# Patient Record
Sex: Female | Born: 1940
Health system: Southern US, Community
[De-identification: ages and names within clinical notes are randomized; demographics above are authoritative.]

## PROBLEM LIST (undated history)

## (undated) DIAGNOSIS — I714 Abdominal aortic aneurysm, without rupture: Secondary | ICD-10-CM

## (undated) DIAGNOSIS — I739 Peripheral vascular disease, unspecified: Secondary | ICD-10-CM

## (undated) DIAGNOSIS — F419 Anxiety disorder, unspecified: Secondary | ICD-10-CM

## (undated) DIAGNOSIS — R6889 Other general symptoms and signs: Secondary | ICD-10-CM

## (undated) DIAGNOSIS — I10 Essential (primary) hypertension: Secondary | ICD-10-CM

## (undated) DIAGNOSIS — E785 Hyperlipidemia, unspecified: Secondary | ICD-10-CM

## (undated) DIAGNOSIS — I2510 Atherosclerotic heart disease of native coronary artery without angina pectoris: Secondary | ICD-10-CM

## (undated) HISTORY — PX: CATARACT EXTRACTION W/ INTRAOCULAR LENS  IMPLANT, BILATERAL: SHX1307

## (undated) HISTORY — DX: Hyperlipidemia, unspecified: E78.5

## (undated) HISTORY — PX: CORONARY ANGIOPLASTY WITH STENT PLACEMENT: SHX49

## (undated) HISTORY — PX: SHOULDER OPEN ROTATOR CUFF REPAIR: SHX2407

## (undated) HISTORY — PX: FEMORAL ARTERY STENT: SHX1583

## (undated) HISTORY — PX: LUMBAR DISC SURGERY: SHX700

---

## 1990-02-12 HISTORY — PX: ABDOMINAL HYSTERECTOMY: SHX81

## 1999-05-08 ENCOUNTER — Encounter: Admission: RE | Admit: 1999-05-08 | Discharge: 1999-05-08 | Payer: Self-pay | Admitting: Obstetrics and Gynecology

## 1999-05-08 ENCOUNTER — Encounter: Payer: Self-pay | Admitting: Obstetrics and Gynecology

## 1999-08-09 ENCOUNTER — Encounter: Payer: Self-pay | Admitting: Obstetrics and Gynecology

## 1999-08-09 ENCOUNTER — Encounter: Admission: RE | Admit: 1999-08-09 | Discharge: 1999-08-09 | Payer: Self-pay | Admitting: Obstetrics and Gynecology

## 2000-05-23 ENCOUNTER — Encounter: Admission: RE | Admit: 2000-05-23 | Discharge: 2000-05-23 | Payer: Self-pay | Admitting: Obstetrics and Gynecology

## 2000-05-23 ENCOUNTER — Encounter: Payer: Self-pay | Admitting: Obstetrics and Gynecology

## 2001-08-29 ENCOUNTER — Encounter: Admission: RE | Admit: 2001-08-29 | Discharge: 2001-08-29 | Payer: Self-pay | Admitting: Obstetrics and Gynecology

## 2001-08-29 ENCOUNTER — Encounter: Payer: Self-pay | Admitting: Obstetrics and Gynecology

## 2002-03-24 ENCOUNTER — Encounter (INDEPENDENT_AMBULATORY_CARE_PROVIDER_SITE_OTHER): Payer: Self-pay | Admitting: Specialist

## 2002-03-24 ENCOUNTER — Ambulatory Visit (HOSPITAL_COMMUNITY): Admission: RE | Admit: 2002-03-24 | Discharge: 2002-03-24 | Payer: Self-pay | Admitting: Gastroenterology

## 2002-11-24 ENCOUNTER — Encounter: Admission: RE | Admit: 2002-11-24 | Discharge: 2002-11-24 | Payer: Self-pay | Admitting: Obstetrics and Gynecology

## 2002-11-24 ENCOUNTER — Encounter: Payer: Self-pay | Admitting: Obstetrics and Gynecology

## 2003-06-09 ENCOUNTER — Ambulatory Visit (HOSPITAL_BASED_OUTPATIENT_CLINIC_OR_DEPARTMENT_OTHER): Admission: RE | Admit: 2003-06-09 | Discharge: 2003-06-09 | Payer: Self-pay | Admitting: Orthopedic Surgery

## 2003-06-09 ENCOUNTER — Ambulatory Visit (HOSPITAL_COMMUNITY): Admission: RE | Admit: 2003-06-09 | Discharge: 2003-06-09 | Payer: Self-pay | Admitting: Orthopedic Surgery

## 2003-12-02 ENCOUNTER — Encounter: Admission: RE | Admit: 2003-12-02 | Discharge: 2003-12-02 | Payer: Self-pay | Admitting: Internal Medicine

## 2004-07-24 ENCOUNTER — Encounter: Admission: RE | Admit: 2004-07-24 | Discharge: 2004-07-24 | Payer: Self-pay | Admitting: Family Medicine

## 2004-08-31 ENCOUNTER — Ambulatory Visit (HOSPITAL_COMMUNITY): Admission: RE | Admit: 2004-08-31 | Discharge: 2004-09-01 | Payer: Self-pay | Admitting: Cardiovascular Disease

## 2004-09-03 ENCOUNTER — Inpatient Hospital Stay (HOSPITAL_COMMUNITY): Admission: EM | Admit: 2004-09-03 | Discharge: 2004-09-05 | Payer: Self-pay | Admitting: Emergency Medicine

## 2004-10-23 ENCOUNTER — Ambulatory Visit (HOSPITAL_COMMUNITY): Admission: RE | Admit: 2004-10-23 | Discharge: 2004-10-24 | Payer: Self-pay | Admitting: Cardiovascular Disease

## 2004-12-20 ENCOUNTER — Encounter: Admission: RE | Admit: 2004-12-20 | Discharge: 2004-12-20 | Payer: Self-pay | Admitting: Family Medicine

## 2005-12-31 ENCOUNTER — Encounter: Admission: RE | Admit: 2005-12-31 | Discharge: 2005-12-31 | Payer: Self-pay | Admitting: Family Medicine

## 2007-01-15 ENCOUNTER — Encounter: Admission: RE | Admit: 2007-01-15 | Discharge: 2007-01-15 | Payer: Self-pay | Admitting: Family Medicine

## 2007-11-27 ENCOUNTER — Encounter: Admission: RE | Admit: 2007-11-27 | Discharge: 2007-11-27 | Payer: Self-pay | Admitting: Obstetrics & Gynecology

## 2008-01-20 ENCOUNTER — Encounter: Admission: RE | Admit: 2008-01-20 | Discharge: 2008-01-20 | Payer: Self-pay | Admitting: Family Medicine

## 2009-01-21 ENCOUNTER — Encounter: Admission: RE | Admit: 2009-01-21 | Discharge: 2009-01-21 | Payer: Self-pay | Admitting: Family Medicine

## 2009-08-01 ENCOUNTER — Encounter: Admission: RE | Admit: 2009-08-01 | Discharge: 2009-08-01 | Payer: Self-pay | Admitting: Cardiovascular Disease

## 2009-08-05 ENCOUNTER — Inpatient Hospital Stay (HOSPITAL_COMMUNITY): Admission: RE | Admit: 2009-08-05 | Discharge: 2009-08-06 | Payer: Self-pay | Admitting: Cardiovascular Disease

## 2009-12-15 ENCOUNTER — Encounter (HOSPITAL_COMMUNITY)
Admission: RE | Admit: 2009-12-15 | Discharge: 2010-03-14 | Payer: Self-pay | Source: Home / Self Care | Attending: Cardiovascular Disease | Admitting: Cardiovascular Disease

## 2010-02-20 ENCOUNTER — Encounter
Admission: RE | Admit: 2010-02-20 | Discharge: 2010-02-20 | Payer: Self-pay | Source: Home / Self Care | Attending: Family Medicine | Admitting: Family Medicine

## 2010-03-05 ENCOUNTER — Encounter: Payer: Self-pay | Admitting: Family Medicine

## 2010-03-15 ENCOUNTER — Encounter (HOSPITAL_COMMUNITY): Payer: Self-pay

## 2010-03-17 ENCOUNTER — Encounter (HOSPITAL_COMMUNITY): Payer: Self-pay

## 2010-03-20 ENCOUNTER — Encounter (HOSPITAL_COMMUNITY): Payer: Self-pay

## 2010-03-22 ENCOUNTER — Encounter (HOSPITAL_COMMUNITY): Payer: Self-pay

## 2010-03-24 ENCOUNTER — Encounter (HOSPITAL_COMMUNITY): Payer: Self-pay

## 2010-03-27 ENCOUNTER — Encounter (HOSPITAL_COMMUNITY): Payer: Self-pay

## 2010-03-29 ENCOUNTER — Encounter (HOSPITAL_COMMUNITY): Payer: Self-pay

## 2010-03-31 ENCOUNTER — Encounter (HOSPITAL_COMMUNITY): Payer: Self-pay

## 2010-04-03 ENCOUNTER — Encounter (HOSPITAL_COMMUNITY): Payer: Self-pay

## 2010-04-05 ENCOUNTER — Encounter (HOSPITAL_COMMUNITY): Payer: Self-pay

## 2010-04-07 ENCOUNTER — Encounter (HOSPITAL_COMMUNITY): Payer: Self-pay

## 2010-04-10 ENCOUNTER — Encounter (HOSPITAL_COMMUNITY): Payer: Self-pay

## 2010-04-12 ENCOUNTER — Encounter (HOSPITAL_COMMUNITY): Payer: Self-pay

## 2010-04-14 ENCOUNTER — Encounter (HOSPITAL_COMMUNITY): Payer: Self-pay

## 2010-04-17 ENCOUNTER — Encounter (HOSPITAL_COMMUNITY): Payer: Self-pay

## 2010-04-19 ENCOUNTER — Encounter (HOSPITAL_COMMUNITY): Payer: Self-pay

## 2010-04-21 ENCOUNTER — Encounter (HOSPITAL_COMMUNITY): Payer: Self-pay

## 2010-04-24 ENCOUNTER — Encounter (HOSPITAL_COMMUNITY): Payer: Self-pay

## 2010-04-26 ENCOUNTER — Encounter (HOSPITAL_COMMUNITY): Payer: Self-pay

## 2010-04-28 ENCOUNTER — Encounter (HOSPITAL_COMMUNITY): Payer: Self-pay

## 2010-04-30 LAB — BASIC METABOLIC PANEL
CO2: 26 mEq/L (ref 19–32)
Calcium: 8.7 mg/dL (ref 8.4–10.5)
Chloride: 102 mEq/L (ref 96–112)
GFR calc Af Amer: >60 mL/min (ref >60)
Glucose, Bld: 102 mg/dL — ABNORMAL HIGH (ref 70–99)
Potassium: 3.7 mEq/L (ref 3.5–5.1)
Sodium: 137 mEq/L (ref 135–145)

## 2010-04-30 LAB — CBC
HCT: 39 % (ref 36.0–46.0)
Hemoglobin: 13.3 g/dL (ref 12.0–15.0)
MCH: 35.2 pg — ABNORMAL HIGH (ref 26.0–34.0)
MCHC: 34.1 g/dL (ref 30.0–36.0)
RBC: 3.78 MIL/uL — ABNORMAL LOW (ref 3.87–5.11)

## 2010-05-01 ENCOUNTER — Encounter (HOSPITAL_COMMUNITY): Payer: Self-pay

## 2010-05-03 ENCOUNTER — Encounter (HOSPITAL_COMMUNITY): Payer: Self-pay

## 2010-05-05 ENCOUNTER — Encounter (HOSPITAL_COMMUNITY): Payer: Self-pay

## 2010-05-08 ENCOUNTER — Encounter (HOSPITAL_COMMUNITY): Payer: Self-pay

## 2010-05-10 ENCOUNTER — Encounter (HOSPITAL_COMMUNITY): Payer: Self-pay

## 2010-05-12 ENCOUNTER — Encounter (HOSPITAL_COMMUNITY): Payer: Self-pay

## 2010-05-15 ENCOUNTER — Encounter (HOSPITAL_COMMUNITY): Payer: Self-pay

## 2010-05-17 ENCOUNTER — Encounter (HOSPITAL_COMMUNITY): Payer: Self-pay

## 2010-05-19 ENCOUNTER — Encounter (HOSPITAL_COMMUNITY): Payer: Self-pay

## 2010-05-22 ENCOUNTER — Encounter (HOSPITAL_COMMUNITY): Payer: Self-pay

## 2010-05-24 ENCOUNTER — Encounter (HOSPITAL_COMMUNITY): Payer: Self-pay

## 2010-05-26 ENCOUNTER — Encounter (HOSPITAL_COMMUNITY): Payer: Self-pay

## 2010-05-29 ENCOUNTER — Encounter (HOSPITAL_COMMUNITY): Payer: Self-pay

## 2010-05-31 ENCOUNTER — Encounter (HOSPITAL_COMMUNITY): Payer: Self-pay

## 2010-06-02 ENCOUNTER — Encounter (HOSPITAL_COMMUNITY): Payer: Self-pay

## 2010-06-05 ENCOUNTER — Encounter (HOSPITAL_COMMUNITY): Payer: Self-pay

## 2010-06-30 NOTE — Cardiovascular Report (Signed)
NAME:  Sara Dominguez, Sara Dominguez            ACCOUNT NO.:  [id number]   MEDICAL RECORD NO.:  [id number]          PATIENT TYPE:  OIB   LOCATION:  2873                         FACILITY:  [facility]   PHYSICIAN:  Nanetta Batty, M.D.   DATE OF BIRTH:  [birth year]   DATE OF PROCEDURE:  08/31/2004  DATE OF DISCHARGE:                              CARDIAC CATHETERIZATION   [name] is a 70 year old married white female patient of Dr. [name] who was referred for claudication.  A screening Cardiolite suggested  anterior ischemia.  The patient did complain of symptoms of chest pain.  Diagnostic cath at [facility] three days ago revealed high  grade proximal LAD disease with moderate circumflex and RCA disease and  normal LV function.  She was treated with aspirin and Plavix and presents  now for elective PCI and stenting.   DESCRIPTION OF PROCEDURE:  The patient was brought to the second floor [facility] in the postabsorptive state.  She was  premedicated with p.o. Valium.  Her right groin was prepped and shaved in  the usual sterile fashion.  1% Xylocaine was used for local anesthesia.  A 7  French sheath was inserted into the right femoral artery using Seldinger  technique.  A 6 French sheath was inserted into the right femoral vein.  A 7  [place] XBLD guide catheter, an [id number] Asahi soft guide-wire, and a 25  Quantum Maverick were used for initial PCI.  The patient was on aspirin and  Plavix and received an additional 150 mg p.o. Plavix and Angiomax bolus with  AST of greater than 300.  Visipaque dye was used for the entirety of the  case.  Retrograde aortic pressures were monitored the rest of the case.  The  patient did become vagal with hypotension and bradycardia requiring 0.5 mg  Atropine and a liter of half normal saline resuscitation which ultimately  brought her blood pressure up above 100.  The guide-wire was placed in the  distal LAD quite  easily.  PCI was performed with the Quantum Maverick at  nominal pressures and stenting with a 2.5/16 Taxus at 15 atmospheres (2.8  mm) for 30 seconds.  The patient did have some PVCs and mild ST segment  elevation which resolved with balloon deflation.  Final angiographic result  was reduction of the 90% proximal LAD lesion to 0% residual with excellent  flow.  The patient tolerated the procedure well.  The guide-wire and  catheter were removed.  The sheath was sewn securely in place.  The patient  left the lab in stable condition.  The sheaths will be removed in two hours.  The patient will be discharged home in the morning on aspirin and Plavix and  we will see him back in the office in 1-2 weeks for follow up.  We will  continue to pursue her peripheral vascular evaluation in several weeks.       JB/MEDQ  D:  08/31/2004  T:  08/31/2004  Job:  [id number]   cc:   [name], M.D.  [address]  [address]  [place], [place] [address]  Fax: [phone]

## 2010-06-30 NOTE — Op Note (Signed)
   NAME:  Criscuolo, MALONE ADMIRE                      ACCOUNT NO.:  [id number]   MEDICAL RECORD NO.:  [id number]                   PATIENT TYPE:  AMB   LOCATION:  ENDO                                 FACILITY:  [facility]   PHYSICIAN:  John C. Madilyn Fireman, M.D.                 DATE OF BIRTH:  Sep 29, 1940   DATE OF PROCEDURE:  03/24/2002  DATE OF DISCHARGE:                                 OPERATIVE REPORT   PROCEDURE:  Colonoscopy.   INDICATIONS FOR PROCEDURE:  Family history of colon cancer in a first degree  relative.   DESCRIPTION OF PROCEDURE:  The patient was placed in the left lateral  decubitus position then placed on the pulse monitor with continuous low flow  oxygen delivered by nasal cannula. She was sedated with 100 mcg IV fentanyl  and 11 mg IV Versed. The Olympus video colonoscope was inserted into the  rectum and advanced to the cecum, confirmed by transillumination at  McBurney's point and visualization of the ileocecal valve and appendiceal  orifice. The prep was excellent. The cecum and ascending colon appeared  normal. Within the transverse colon, there was an 8 mm sessile polyp which  was fulgurated by hot biopsy. The remainder of the transverse, descending  and sigmoid appeared normal with no further masses, polyps, diverticula or  other mucosal abnormalities. Within the rectum, there was a 1 cm sessile  polyp which was fulgurated by snare. The scope was then withdrawn and the  patient returned to the recovery room in stable condition. The patient  tolerated the procedure well and there were no immediate complications.   IMPRESSION:  Rectal and transverse colon polyps.   PLAN:  Await histology to determine interval for next colonoscopy.                                               John C. Madilyn Fireman, M.D.    JCH/MEDQ  D:  03/24/2002  T:  03/24/2002  Job:  [id number]   cc:   Aram Beecham P. Romine, M.D.  [address]  [place]  [place] [address]  Fax: [phone]

## 2010-06-30 NOTE — Discharge Summary (Signed)
NAME:  Sara Dominguez, Sara Dominguez            ACCOUNT NO.:  [id number]   MEDICAL RECORD NO.:  [id number]          PATIENT TYPE:  OIB   LOCATION:  6532                         FACILITY:  [facility]   PHYSICIAN:  Nanetta Batty, M.D.   DATE OF BIRTH:  August 09, 1940   DATE OF ADMISSION:  10/23/2004  DATE OF DISCHARGE:  10/24/2004                                 DISCHARGE SUMMARY   DISCHARGE DIAGNOSES:  1.  Peripheral vascular disease with life/activity-limiting claudication and      status post intervention during this admission to the right SFA.  2.  Known coronary artery disease with prior intervention.  3.  Hypertension.  4.  Hyperlipidemia.  5.  History of long tobacco abuse.  Quit two months ago.   HISTORY OF PRESENT ILLNESS/HOSPITAL COURSE:  This is a [age] year old Caucasian  Dominguez who was seen by Dr. August Saucer in our office for claudication of lower  extremities, right greater than left.  She ultrasound Dopplers and  ultrasound which revealed abnormal ABIs and patient was scheduled for  peripheral vascular angiography to delineate anatomy and possible  intervention.  For that she was brought to the hospital to the short-stay  unit on October 23, 2004.  She was taken to the peripheral vascular  laboratory.  After pre medication underwent peripheral vascular angiography  40% long segmental mid SFA lesion with three vessel runoff of the left lower  extremity and right lower extremity revealed 40% segmental right external  iliac artery lesion, 80% focal right SFA lesion with three vessel runoff.  Abdominal aorta revealed normal renal arteries and infrarenal aorta.  Had a  moderate ______sclerosis.  Patient underwent angioplasty and stenting with  Smart stent of right SFA artery with reduction of the lesion from 80 to 0%.  Procedure was performed by Dr. [name].  Patient was given aspirin and Plavix.  Tolerated procedure well.  Transferred to the telemetry unit in stable  condition.   LABORATORIES:   Hemoglobin was 11.1 and hematocrit 31.9, white blood cell  count 8.2, platelets 254.  Sodium 139, potassium 4, chloride 105, CO2 28,  BUN 8, creatinine 0.8, glucose 95.  She was evaluated by Dr. [name] on the  day of discharge and her groin did not have any signs of hematoma.  Pedal  pulses were intact and patient was considered to be stable for discharge  home.   DISCHARGE MEDICATIONS:  1.  Benicar 40/12.5 mg daily.  2.  Estrogen patch weekly, change on Monday.  3.  Multivitamins daily.  4.  Calcium 300 mg with vitamin D daily.  5.  Fish oil 1000 mg daily.  6.  Aspirin 81 mg daily.  7.  Prilosec over-the-counter 20 mg daily.  8.  Plavix 75 mg daily.   DISCHARGE DIET:  Low salt, low fat, low cholesterol diet.   DISCHARGE ACTIVITIES:  No driving.  No lifting greater than 5 pounds.  No  strenuous activities for three days post catheterization.  Patient was  instructed to increase activities slowly.  She was allowed to shower but no  bath was allowed.   DISCHARGE FOLLOW-UP:  Lower extremity ultrasound  scheduled on October 5 at  4:30 p.m. and appointment with Dr. [name] scheduled on October 10 at 10:30  a.m.      Raymon Mutton, P.A.      Nanetta Batty, M.D.  Electronically Signed    MK/MEDQ  D:  10/24/2004  T:  10/24/2004  Job:  [id number]

## 2010-06-30 NOTE — Cardiovascular Report (Signed)
NAME:  Sara Dominguez, Sara Dominguez            ACCOUNT NO.:  [id number]   MEDICAL RECORD NO.:  [id number]          PATIENT TYPE:  INP   LOCATION:  3733                         FACILITY:  [facility]   PHYSICIAN:  Madaline Savage, M.D.DATE OF BIRTH:  20-Apr-1940   DATE OF PROCEDURE:  09/04/2004  DATE OF DISCHARGE:                              CARDIAC CATHETERIZATION   PROCEDURES PERFORMED:  1.  Retrograde left heart catheterization without left ventricular      angiography.  2.  Selective coronary angiography by Judkins technique.   INDICATIONS FOR PROCEDURE:  The patient is 70 years old. She had a drug-  eluting stent placed in her LAD on August 31, 2004 by Dr. [name]. She had chest  pain within the last two days and represents at this time with elevated  troponins that in retrospect appear to be a holdover from her elevated  troponins done prior to original stenting. This catheterization is done to  therefore clarify the issues leading to chest pain and elevated troponins  now four days after LAD stenting.   RESULTS:  The left ventricular pressure was 90/9, end-diastolic pressure 10.  Central aortic pressure 90/52, mean of 70. No aortic valve gradient by  pullback technique.   ANGIOGRAPHIC RESULTS:  The patient's left main coronary artery is normal.  The proximal LAD is fairly small. It is mildly calcified and in a long  segmental nature. There is a radio-opaque stent which is the largest portion  of the proximal and mid LAD. The stent overlaps the first septal perforator  branch. There is a step-up and step-down into and from the stent. The vessel  distal to the stented LAD is somewhat tortuous. There is a diagonal that  comes off at an acute angle but basically there is luminal irregularity only  no high-grade stenosis in the entire LAD status post LAD stenting.   Left circumflex coronary artery is a nondominant vessel giving rise to an  obtuse marginal branch which is as big as a circumflex  itself. Although  there are luminal irregularities, there are no significant high-grade  lesions in that circumflex.   The right coronary artery is a very bizarre-looking vessel that is dominant  in that it gives rise to posterior descending branch and a small  posterolateral branch; however, the vessel is smallest in its proximal  portion just before an acute marginal branch. It is perhaps only 1.5 to 2 mm  in diameter there. Then the vessel becomes ectatic. Then it becomes  aneurysmal midway down and then the largest portion of the vessel is the  distal vessel. The aneurysmal portion of the vessel contains a hypodense  area that is not felt to be stenotic. After intravenous nitroglycerin to 200  milligrams was given, the vessel plumps up and shows that there is no  significant lesions throughout the entirety of the right coronary artery  which remains bizarre-looking even after nitroglycerin administration.   As previously stated, no left ventriculogram was performed since it was just  done four days ago and there has been no interval change that would warrant  another need for LV  angiography, but pressures were done which showed no  evidence of a gradient.   FINAL DIAGNOSES:  1.  Status post left anterior descending artery stenting four days ago with      patency of left anterior descending artery stent site. No new lesions in      any coronary artery territory identified over the interim from today and      four days ago.  2.  The plan will be reassurance to the patient and I suspect that Plavix      may have some relation to the patient's chest pain so Pepcid was given      in the cath lab 20 milligrams IV. We will give her oral Protonix twice      daily 40 milligrams for the next week and thereafter Protonix 40      milligrams a day for at least a month.       WHG/MEDQ  D:  09/04/2004  T:  09/04/2004  Job:  [id number]   cc:   Nanetta Batty, M.D.  Fax: [phone]   Cardiac  catheter lab   [facility] Records

## 2010-06-30 NOTE — Discharge Summary (Signed)
NAME:  Sara Dominguez, Sara Dominguez            ACCOUNT NO.:  [id number]   MEDICAL RECORD NO.:  [id number]          PATIENT TYPE:  INP   LOCATION:  3733                         FACILITY:  [facility]   PHYSICIAN:  Nanetta Batty, M.D.   DATE OF BIRTH:  20-Jun-1940   DATE OF ADMISSION:  09/03/2004  DATE OF DISCHARGE:  09/05/2004                                 DISCHARGE SUMMARY   ADMISSION DIAGNOSES:  1.  Recurrent chest pain, status post percutaneous coronary intervention and      stenting of the left anterior descending artery on August 31, 2004, using      a Taxus stent to the left anterior descending artery proximal lesion,      90% to 0.  2.  Hypertension.  3.  Dyslipidemia.  4.  Tobacco abuse.   DISCHARGE DIAGNOSES:  1.  Recurrent chest pain, status post percutaneous coronary intervention and      stenting of the left anterior descending artery on August 31, 2004, using      a Taxus stent to the left anterior descending artery proximal lesion,      90% to 0.  2.  Hypertension.  3.  Dyslipidemia.  4.  Tobacco abuse.   PROCEDURES:  Cardiac catheterization September 04, 2004, Madaline Savage, M.D.   BRIEF HISTORY:  The patient is a 70 year old white female who presented to  the ER in the a.m. of September 04, 2004, complaining of chest pain.  She had  recently undergone PCI and stenting of her LAD after having a positive  Cardiolite.  She was admitted, placed on IV Integrilin and nitroglycerin.  The nitroglycerin had to be discontinued because of hypertension, so she was  maintained on heparin IV and Integrilin.  Her enzymes were negative.  She  was taken to the catheterization lab on September 04, 2004, at which time Dr.  Chanda Busing performed the study.  Findings showed no new lesions since  four days prior.  The LAD stent was patent, and it was his opinion that her  chest discomfort was GI in origin.  She remained stable overnight.  The  following day her hemoglobin was 13.1, hematocrit was 38.6, white  count was  8.1, platelets were 227,000.  Electrolytes were normal, BUN was 14,  creatinine of 0.8, glucose was 96.  Troponins were 0.79 and 0.97, CKs and CK-  MBs were negative.  At this point it was Dr. Truett Perna opinion that the  patient could be discharged to home.  She was having no chest pain.   She will be placed on:  1.  Zocor 40 mg daily.  2.  Plavix 75 mg daily.  3.  Coated aspirin 325 mg daily.  4.  Benicar/HCTZ 40/12.5 mg one daily.  5.  Nitroglycerin one every five minutes p.r.n. as needed sublingual.  6.  Protonix 40 mg daily.   She was instructed to keep her August 1 appointment with Dr. [name].  She was  also re-instructed to discontinue smoking.      Eber Hong, P.A.      Nanetta Batty, M.D.  Electronically Signed  WDJ/MEDQ  D:  11/07/2004  T:  11/08/2004  Job:  [id number]   cc:   [name], M.D.  Fax: [phone]

## 2010-06-30 NOTE — Cardiovascular Report (Signed)
NAME:  Sara Dominguez, Sara Dominguez            ACCOUNT NO.:  [id number]   MEDICAL RECORD NO.:  [id number]          PATIENT TYPE:  OIB   LOCATION:  6532                         FACILITY:  [facility]   PHYSICIAN:  Nanetta Batty, M.D.   DATE OF BIRTH:  02-29-1940   DATE OF PROCEDURE:  10/23/2004  DATE OF DISCHARGE:                              CARDIAC CATHETERIZATION   [name] is a 70 year old female with a history of CAD and PVOD.  She  was originally referred for claudication.  She has known CAD status post PCI  and stenting of her LAD with moderate circumflex and RCA disease.  Dopplers  revealed high grade right SFA stenosis.  She presents now for angiography  and potential intervention.   DESCRIPTION OF PROCEDURE:  The patient was brought to the sixth floor [facility] in the postabsorptive state.  She was premedicated with p.o. Valium.  Her left groin was prepped and  shaved in the usual sterile fashion.  1% Xylocaine was used for local  anesthesia.  A 5 French sheath was inserted into the left femoral artery  using standard Seldinger technique.  A 5 [place] tennis racquet catheter and  a cross over catheter were used for distal abdominal aortography with  bilateral femoral run off.  Visipaque dye was used for the entirety of the  case.  Retrograde aortic pressures were monitored during the case.   ANGIOGRAPHIC RESULTS:  1.  Abdominal aorta:      1.  Renal arteries - normal.      2.  Infrarenal abdominal aorta - moderate atherosclerotic changes.  2.  Left lower extremity:      1.  40% long segmental mid left SFA.  3.  Right lower extremity:      1.  40% segmental right external iliac artery.      2.  40% segmental mid right SFA.      3.  80% fairly focal SFA in [place] with three vessel run off.   PROCEDURE DESCRIPTION:  Contralateral access was obtained with a cross over  catheter, Wholey wire, and 7 French Terumo sheath.  The patient  received  2500 units of heparin intravenously.  Using 035 Wholey wire and a 4/2  Powerflex, PTCA was performed.  Stenting was performed with a 6/2 Smart and  post dilatation with a 6/2 Powerflex, resulting in reduction of 80% fairly  focal distal right SFA stenosis in [place] to 0% residual with  excellent flow.  The patient tolerated the procedure well.  The wire was  removed.  The long sheath was exchanged for a short 7 sheath.  The ACT was  measured.  The sheath was removed and pressure was held on the groin to  achieve hemostasis.  The patient left the lab in stable condition.  She will  be treated with aspirin and Plavix, hydrated, and discharged home in the  morning.  She will have follow up lower extremity Dopplers and she will be  seen back in the office in 1-2 weeks for follow up.      Nanetta Batty, M.D.  Electronically Signed     JB/MEDQ  D:  10/23/2004  T:  10/23/2004  Job:  [id number]   cc:   [name], M.D.  Fax: [phone]

## 2010-06-30 NOTE — H&P (Signed)
NAME:  Dominguez, Sara            ACCOUNT NO.:  [id number]   MEDICAL RECORD NO.:  [id number]          PATIENT TYPE:  INP   LOCATION:  1823                         FACILITY:  [facility]   PHYSICIAN:  Ulyses Amor, MD DATE OF BIRTH:  01/14/1941   DATE OF ADMISSION:  09/03/2004  DATE OF DISCHARGE:                                HISTORY & PHYSICAL   Sara Dominguez is a 70 year old white woman who was admitted to [facility] for further evaluation of chest pain.   This patient has a history of coronary artery disease which dates back  several weeks. A screening Cardiolite demonstrated evidence of anterior  ischemia. She then underwent cardiac catheterization which demonstrated a  high grade proximal LAD lesion as well as moderate disease in the circumflex  and right coronary artery. Three days ago, the patient underwent  uncomplicated stent deployment in the proximal LAD. She was discharged and  experienced no symptoms until this morning. She awoke with a low grade  discomfort in her right anterior chest. This was described as a pressure. It  continued throughout the course of the day. This afternoon, she took three  nitroglycerin which seemed to help the discomfort somewhat though it has  continued. She believes it is similar in quality to the chest discomfort  that she experienced prior to her stent placement. It appears to be  unrelated to position, activity, meals, or respirations. There were no  exacerbating or ameliorating factors other than as described above. There is  no associated dyspnea, diaphoresis, or nausea. Though the chest discomfort  is largely resolved at this time, she still feels it present to some minor  extent.   The patient has no history of myocardial infarction, congestive heart  failure, or arrhythmia. She has a number of risk factors of coronary artery  disease including hypertension, dyslipidemia, smoking (discontinued at the  time of  her cardiac catheterization), and a family history of coronary  artery disease. There is no history of diabetes mellitus.   The patient's past medical history is otherwise unremarkable.   MEDICATIONS:  Plavix, Benicar, Zocor, and aspirin.   ALLERGIES:  None.   SIGNIFICANT INJURIES:  None.   OPERATIONS:  1.  Back surgery.  2.  Shoulder surgery.  3.  Hysterectomy.   SOCIAL HISTORY:  The patient is a retired Engineer, site. She lives with her  husband. She drinks occasional wine. She no longer smokes cigarettes as  described above.   FAMILY HISTORY:  A father died of a myocardial infarction. Her mother died  of colon cancer.   REVIEW OF SYSTEMS:  Reveals no new problems related to her head, eyes, ears,  nose, mouth, throat, lungs, gastrointestinal system, genitourinary system,  or extremities. There is no history of neurologic or psychiatric disorder.  There is no history of fever, chills, or weight loss.   PHYSICAL EXAMINATION:  VITAL SIGNS:  Blood pressure 123/70, pulse 63 and  regular, respirations 22. Temperature 97.9.  GENERAL:  The patient was a middle-aged white woman in no discomfort. She  was alert, oriented, appropriate, and responsive.  HEENT:  Normal.  NECK:  Without thyromegaly or adenopathy. Carotid pulses were palpable  bilaterally and without bruits.  CARDIOVASCULAR:  Normal S1 and S2. There was no S3, S4, murmur, rub, or  click. Cardiac rhythm was regular. No chest wall tenderness was noted.  LUNGS:  Clear.  ABDOMEN:  Soft and nontender. There was no mass, hepatosplenomegaly, bruit,  distension, rebound, guarding, or rigidity. Bowel sounds were normal.  BREASTS:  Not performed as they were not pertinent to the reason for acute  care hospitalization.  PELVIC:  Not performed as they were not pertinent to the reason for acute  care hospitalization.  RECTAL:  Not performed as they were not pertinent to the reason for acute  care hospitalization.  EXTREMITIES:   Without edema, deviation, or deformity. Radial and dorsalis  pedis pulses were palpable bilaterally.  NEUROLOGICAL:  Unremarkable.   The electrocardiogram was normal. The chest radiography had not yet been  performed at the time of this dictation. The initial set of cardiac markers  revealed a myoglobin of 45.4. CK-MB of 1.2, and troponin 0.30. The second  set of cardiac markers revealed a myoglobin of 46.3, CK-MB 1.1, and troponin  0.30. Potassium was 3.9, BUN 10, and creatinine 0.8. The remaining studies  were pending at the time of this dictation.   IMPRESSION:  1.  Chest pain:  Rule out cardiac ischemia.  2.  Coronary artery disease:  Three days status post proximal left anterior      descending artery stent deployment with residual moderate disease of the      circumflex and right coronary artery.  3.  Dyslipidemia.  4.  Hypertension.   PLAN:  1.  Telemetry.  2.  Serial cardiac enzymes.  3.  Aspirin.  4.  Continued Plavix.  5.  Intravenous nitroglycerin.  6.  Intravenous heparin.  7.  Further measures per Dr. Nanetta Batty.       MSC/MEDQ  D:  09/03/2004  T:  09/03/2004  Job:  [id number]   cc:   Nanetta Batty, M.D.  Fax: [phone]

## 2010-06-30 NOTE — Op Note (Signed)
NAME:  Griffin, DOT SPLINTER                      ACCOUNT NO.:  [id number]   MEDICAL RECORD NO.:  [id number]                   PATIENT TYPE:  AMB   LOCATION:  DSC                                  FACILITY:  [facility]   PHYSICIAN:  Harvie Junior, M.D.                DATE OF BIRTH:  1940/09/28   DATE OF PROCEDURE:  06/09/2003  DATE OF DISCHARGE:                                 OPERATIVE REPORT   PREOPERATIVE DIAGNOSIS:  Impingement with suspected rotator cuff tear.   POSTOPERATIVE DIAGNOSIS:  Impingement with rotator cuff tear.   OPERATION PERFORMED:  1. Mini open rotator cuff repair.  2. Arthroscopic acromioplasty.  3. Debridement of the rotator cuff from within the glenohumeral joint.   SURGEON:  Harvie Junior, M.D.   ASSISTANT:  Marshia Ly, P.A.   ANESTHESIA:  General.   INDICATIONS FOR PROCEDURE:  The patient is a 70 year old female with a long  history of having left shoulder pain.  She was evaluated in the office where  injection therapy worked wonderfully to control her pain.  Subsequent  injections worked less well.  She had some neck issues which had been  evaluated, mostly were right-sided neck issues and because of continued  complaints of left shoulder pain, failure of conservative care and  improvement with injection, she was taken to the operating room for  arthroscopic evaluation and repair as needed.   DESCRIPTION OF PROCEDURE:  The patient was taken to the operating room and  after adequate anesthesia had been obtained with general anesthetic, patient  placed supine on the operating table where the left shoulder was prepped and  draped in the usual sterile fashion. Following this, routine arthroscopic  examination of the shoulder revealed that the biceps tendon was within  normal limits.  Glenohumeral joint within normal limits.  There was a small  superior posterior labral tear which was debrided.  Attention was turned to  the rotator cuff undersurface.  There  was a thinned area at the anterior  leading edge of the supraspinatus which was evaluated and debrided.  This  was a partial thickness tear and did not go full thickness through the  rotator cuff.  Following this, attention was turned now to the glenohumeral  joint and to the subacromial space.  The subacromial space was identified  and noted to have a large anterolateral spur.  This was debrided with a  motorized bur back to a smooth undersurface.  The acromioclavicular joint  was identified and noted to have no significant pathology.  Attention was  turned to the rotator cuff.  There was a large thickened area of the rotator  cuff which was debrided from the superior surface and probed.  The probe  ultimately did go through the cuff in this area and there was concern given  the nature of the appearance of the undersurface and the appearance of the  bursal side this really was  an impending rotator cuff tear.  At this point  the camera was removed from the shoulder.  A small incision was made over  the lateral side.  The subcutaneous tissue dissected down through the area  of the deltoid muscle.  The bursa was resected.  The rotator cuff was  identified and the rotator cuff was repaired with a single 5-0 anchor with  two #2 FiberWire sutures.  #2 FiberWire closing suture was then used on the  side-to-side component of this and following this, the shoulder was  copiously irrigated and suctioned dry.  The shoulder was put through range  of motion.  Easy full range of motion was achieved at this point.  The  deltoid was then closed with 1 Vicryl running suture.  The skin with a  combination  of 2-0 and 3-0 Maxon pullout suture.  Benzoin and Steri-Strips were applied.  Sterile compressive dressing was applied.  The patient was then transferred  to the recovery room where she was noted to be in satisfactory condition in  a shoulder sling.                                               Harvie Junior, M.D.    Ranae Plumber  D:  06/09/2003  T:  06/09/2003  Job:  [id number]

## 2011-01-26 ENCOUNTER — Other Ambulatory Visit: Payer: Self-pay | Admitting: Family Medicine

## 2011-01-26 DIAGNOSIS — Z1231 Encounter for screening mammogram for malignant neoplasm of breast: Secondary | ICD-10-CM

## 2011-02-22 ENCOUNTER — Ambulatory Visit
Admission: RE | Admit: 2011-02-22 | Discharge: 2011-02-22 | Disposition: A | Discharge status: Home / Self Care | Payer: Medicare Other | Source: Ambulatory Visit | Attending: Family Medicine | Admitting: Family Medicine

## 2011-02-22 DIAGNOSIS — Z1231 Encounter for screening mammogram for malignant neoplasm of breast: Secondary | ICD-10-CM

## 2011-02-27 ENCOUNTER — Other Ambulatory Visit: Payer: Self-pay | Admitting: Gastroenterology

## 2011-06-26 ENCOUNTER — Encounter (HOSPITAL_COMMUNITY): Payer: Self-pay | Admitting: Respiratory Therapy

## 2011-07-03 ENCOUNTER — Other Ambulatory Visit: Payer: Self-pay | Admitting: Cardiovascular Disease

## 2011-07-05 ENCOUNTER — Encounter (HOSPITAL_COMMUNITY): Payer: Self-pay | Admitting: General Practice

## 2011-07-05 ENCOUNTER — Encounter (HOSPITAL_COMMUNITY)
Admission: RE | Disposition: A | Discharge status: Home / Self Care | Payer: Self-pay | Source: Ambulatory Visit | Attending: Cardiovascular Disease

## 2011-07-05 ENCOUNTER — Ambulatory Visit (HOSPITAL_COMMUNITY)
Admission: RE | Admit: 2011-07-05 | Discharge: 2011-07-06 | Disposition: A | Discharge status: Home / Self Care | Payer: Medicare Other | Source: Ambulatory Visit | Attending: Cardiovascular Disease | Admitting: Cardiovascular Disease

## 2011-07-05 DIAGNOSIS — I739 Peripheral vascular disease, unspecified: Secondary | ICD-10-CM | POA: Diagnosis present

## 2011-07-05 DIAGNOSIS — Z9861 Coronary angioplasty status: Secondary | ICD-10-CM | POA: Insufficient documentation

## 2011-07-05 DIAGNOSIS — I2510 Atherosclerotic heart disease of native coronary artery without angina pectoris: Secondary | ICD-10-CM | POA: Insufficient documentation

## 2011-07-05 DIAGNOSIS — I714 Abdominal aortic aneurysm, without rupture: Secondary | ICD-10-CM | POA: Diagnosis present

## 2011-07-05 DIAGNOSIS — R6889 Other general symptoms and signs: Secondary | ICD-10-CM | POA: Diagnosis present

## 2011-07-05 DIAGNOSIS — I70219 Atherosclerosis of native arteries of extremities with intermittent claudication, unspecified extremity: Secondary | ICD-10-CM | POA: Insufficient documentation

## 2011-07-05 HISTORY — PX: ABDOMINAL AORTAGRAM: SHX5454

## 2011-07-05 HISTORY — DX: Essential (primary) hypertension: I10

## 2011-07-05 HISTORY — DX: Other general symptoms and signs: R68.89

## 2011-07-05 HISTORY — DX: Anxiety disorder, unspecified: F41.9

## 2011-07-05 HISTORY — DX: Peripheral vascular disease, unspecified: I73.9

## 2011-07-05 HISTORY — DX: Abdominal aortic aneurysm, without rupture: I71.4

## 2011-07-05 HISTORY — PX: LOWER EXTREMITY ANGIOGRAM: SHX5508

## 2011-07-05 HISTORY — PX: PERCUTANEOUS STENT INTERVENTION: SHX5500

## 2011-07-05 HISTORY — DX: Atherosclerotic heart disease of native coronary artery without angina pectoris: I25.10

## 2011-07-05 LAB — POCT ACTIVATED CLOTTING TIME
Activated Clotting Time: 314 seconds
Activated Clotting Time: 226 seconds
Activated Clotting Time: 188 seconds

## 2011-07-05 SURGERY — ANGIOGRAM, LOWER EXTREMITY
Anesthesia: LOCAL | Laterality: Right

## 2011-07-05 MED ORDER — ASPIRIN 325 MG PO TABS: 325.0000 mg | ORAL_TABLET | Freq: Every day | ORAL | Status: DC

## 2011-07-05 MED ORDER — HEPARIN (PORCINE) IN NACL 2-0.9 UNIT/ML-% IJ SOLN
INTRAMUSCULAR | Status: AC
  Filled 2011-07-05: qty 1000

## 2011-07-05 MED ORDER — SODIUM CHLORIDE 0.9 % IV SOLN
INTRAVENOUS | Status: DC
  Administered 2011-07-05: 1000 mL via INTRAVENOUS

## 2011-07-05 MED ORDER — LIDOCAINE HCL (PF) 1 % IJ SOLN
INTRAMUSCULAR | Status: AC
  Filled 2011-07-05: qty 30

## 2011-07-05 MED ORDER — SODIUM CHLORIDE 0.9 % IV SOLN: INTRAVENOUS | Status: AC

## 2011-07-05 MED ORDER — ATORVASTATIN CALCIUM 40 MG PO TABS
40.0000 mg | ORAL_TABLET | Freq: Every day | ORAL | Status: DC
Start: 2011-07-05 — End: 2011-07-06
  Administered 2011-07-05: 40 mg via ORAL
  Filled 2011-07-05 (×2): qty 1

## 2011-07-05 MED ORDER — DIAZEPAM 5 MG PO TABS
5.0000 mg | ORAL_TABLET | ORAL | Status: AC
  Administered 2011-07-05: 5 mg via ORAL
  Filled 2011-07-05: qty 1

## 2011-07-05 MED ORDER — PRASUGREL HCL 10 MG PO TABS
10.0000 mg | ORAL_TABLET | Freq: Every day | ORAL | Status: DC
  Filled 2011-07-05: qty 1

## 2011-07-05 MED ORDER — MIDAZOLAM HCL 2 MG/2ML IJ SOLN
INTRAMUSCULAR | Status: AC
  Filled 2011-07-05: qty 2

## 2011-07-05 MED ORDER — ACETAMINOPHEN 325 MG PO TABS: 650.0000 mg | ORAL_TABLET | ORAL | Status: DC | PRN

## 2011-07-05 MED ORDER — ASPIRIN EC 325 MG PO TBEC: 325.0000 mg | DELAYED_RELEASE_TABLET | Freq: Every day | ORAL | Status: DC

## 2011-07-05 MED ORDER — VENLAFAXINE HCL 37.5 MG PO TABS
37.5000 mg | ORAL_TABLET | Freq: Every day | ORAL | Status: DC
Start: 2011-07-05 — End: 2011-07-06
  Administered 2011-07-05: 37.5 mg via ORAL
  Filled 2011-07-05 (×2): qty 1

## 2011-07-05 MED ORDER — LOSARTAN POTASSIUM 50 MG PO TABS
100.0000 mg | ORAL_TABLET | Freq: Every day | ORAL | Status: DC
  Filled 2011-07-05: qty 2

## 2011-07-05 MED ORDER — MORPHINE SULFATE 2 MG/ML IJ SOLN: 1.0000 mg | INTRAMUSCULAR | Status: DC | PRN

## 2011-07-05 MED ORDER — SODIUM CHLORIDE 0.9 % IJ SOLN: 3.0000 mL | INTRAMUSCULAR | Status: DC | PRN

## 2011-07-05 MED ORDER — FENTANYL CITRATE 0.05 MG/ML IJ SOLN
INTRAMUSCULAR | Status: AC
  Filled 2011-07-05: qty 2

## 2011-07-05 MED ORDER — LOSARTAN POTASSIUM 50 MG PO TABS: 100.0000 mg | ORAL_TABLET | Freq: Every day | ORAL | Status: DC

## 2011-07-05 MED ORDER — HEPARIN SODIUM (PORCINE) 1000 UNIT/ML IJ SOLN
INTRAMUSCULAR | Status: AC
  Filled 2011-07-05: qty 1

## 2011-07-05 MED ORDER — ONDANSETRON HCL 4 MG/2ML IJ SOLN: 4.0000 mg | Freq: Four times a day (QID) | INTRAMUSCULAR | Status: DC | PRN

## 2011-07-05 NOTE — Progress Notes (Signed)
Left femoral arterial sheath removed at 1520 per VO Dr Erlene Quan (act 188) due to mild hematoma formation above site. Pressure to site x 40 min. Pressure dressing applied. Site soft, non raised, no visible bruise . Left PT palpable. Pt teaching done.  canderson rn

## 2011-07-05 NOTE — Op Note (Signed)
Sara Dominguez is a 71 y.o. female    [id number] LOCATION:  FACILITY: [facility]  PHYSICIAN: Nanetta Batty, M.D. 12-Sep-1940   DATE OF PROCEDURE:  07/05/2011  DATE OF DISCHARGE:  [facility]  PV Intervention    History obtained from chart review. Sara Dominguez is a 71 year old married Caucasian female mother of 2 children who has a history of CAD status post LAD stenting by myself back in 2006 as well as right SFA stenting several months thereafter. She smoked in Sara past but stopped a few years ago. I catheterized Sara Dominguez 08/05/2009 revealing a patent LAD stent, a chronically occluded OM branch with a high-grade mid RCA stenosis. I stented Sara Dominguez with a Taxus Ion drug-eluting stent. At that time I did demonstrate a small dilator aortic aneurysm measuring 2.3 x 2.4 cm. She has been complaining of right lower [place] pain. Recent Dopplers revealed a decrease in Sara Dominguez right ABI from 0.99 to .75 with a high-frequency signal signal in Sara Dominguez mid right SFA. She presents now for angiography and percutaneous intervention for lifestyle limiting claudication.  PROCEDURE DESCRIPTION    Sara Dominguez was brought to Sara second floor  [facility] lab in Sara postabsorptive state. She was  premedicated with Valium 5 mg by mouth, and IV Versed and fentanyl.. Sara Dominguez left groin was prepped and shaved in usual sterile fashion. Xylocaine 1% was used  for local anesthesia. A 5 French sheath was inserted into Sara left common femoral  artery using standard Seldinger technique. Sara 5 French pigtail catheter was used for abdominal aortography with bifemoral runoff using bolus chase digital subtraction settable technique. Visipaque dye was used for Sara entirety of Sara case. Retrograde aortic pressure was monitored during Sara case.  HEMODYNAMICS:    AO SYSTOLIC/AO DIASTOLIC: 160/80    ANGIOGRAPHIC RESULTS:   1: Abdominal aortogram-renal artery is widely patent, infrarenal, aorta without  significant disease.  2: Left lower extremity-normal with three-vessel runoff  3: Right lower extremity-there was a 90% complex localized lesion in Sara mid right SFA proximal to Sara previous placed stent. There was 60% "in-stent restenosis" within Sara SFA stent with three-vessel runoff.   IMPRESSION:Sara Dominguez has progression of disease in Sara Dominguez right SFA. Will proceed with cutting balloon atherectomy of Sara Dominguez "in-stent restenosis as well as PTA and stenting of Sara Dominguez de novo lesion.  Procedure description: Contralateral access was obtained Sara crossover catheter, Versicore , and  wire and a 6 [place] destination sheath.Sara Dominguez received 5000 units of heparin intravenously with an ACT of 314. A total of 181 cc of contrast was administered to Sara Dominguez. He did know Sara lesion and stent were crossed with an 014,, 300 cm length [place] core wire within a quick  cross catheter. Sara denovo lesion was predilated with a 4 mm x 2 cm balloon. Following this a 4 mm x 10 mm long cutting balloon was used to atherectomized Sara in-stent restenosis within Sara previously placed right SFA stent. Then, a 6 mm x 30 mm long Abbott Absolut Pro Nitinol self-expanding stent was then carefully positioned and deployed across Sara previously predilated de novo lesion and post dilated with a 5 mm x 2 cm balloon resulting in reduction of a 90% stenosis to 0% residual. Completion angiography was performed revealing widely patent lesions with three-vessel runoff.  Final impression: Successful cutting balloon atherectomy of right SFA in-stent restenosis" with within a previously placed sough expanding stent, and PTA and stenting of a high-grade de novo lesion excellent  angiographic result. Sara sheath was then withdrawn across Sara iliac bifurcation and secured. Sara Dominguez left Sara lab is stable condition. Sara sheath will be removed once Sara ACT falls below 170 and pressure will be held. Sara Dominguez will be gently hydrated overnight, and  discharge him in Sara morning on aspirin and effient E and. She'll get followup Dopplers in our office after which she'll be seen back.  Runell Gess MD, [facility] 07/05/2011 1:51 PM

## 2011-07-06 ENCOUNTER — Encounter (HOSPITAL_COMMUNITY): Payer: Self-pay | Admitting: Cardiology

## 2011-07-06 DIAGNOSIS — I714 Abdominal aortic aneurysm, without rupture, unspecified: Secondary | ICD-10-CM

## 2011-07-06 DIAGNOSIS — R6889 Other general symptoms and signs: Secondary | ICD-10-CM

## 2011-07-06 DIAGNOSIS — I2510 Atherosclerotic heart disease of native coronary artery without angina pectoris: Secondary | ICD-10-CM | POA: Diagnosis present

## 2011-07-06 DIAGNOSIS — I739 Peripheral vascular disease, unspecified: Secondary | ICD-10-CM | POA: Diagnosis present

## 2011-07-06 HISTORY — DX: Other general symptoms and signs: R68.89

## 2011-07-06 HISTORY — DX: Abdominal aortic aneurysm, without rupture, unspecified: I71.40

## 2011-07-06 HISTORY — DX: Atherosclerotic heart disease of native coronary artery without angina pectoris: I25.10

## 2011-07-06 HISTORY — DX: Peripheral vascular disease, unspecified: I73.9

## 2011-07-06 HISTORY — DX: Abdominal aortic aneurysm, without rupture: I71.4

## 2011-07-06 LAB — BASIC METABOLIC PANEL
BUN: 11 mg/dL (ref 6–23)
Chloride: 103 mEq/L (ref 96–112)
GFR calc Af Amer: >90 mL/min (ref >90)
Potassium: 4.4 mEq/L (ref 3.5–5.1)
Sodium: 139 mEq/L (ref 135–145)

## 2011-07-06 LAB — CBC
HCT: 40 % (ref 36.0–46.0)
Hemoglobin: 13.4 g/dL (ref 12.0–15.0)
RBC: 4.02 MIL/uL (ref 3.87–5.11)
WBC: 9.2 10*3/uL (ref 4.0–10.5)

## 2011-07-06 NOTE — Progress Notes (Signed)
Physician Discharge Summary  Patient ID: Sara Dominguez MRN: [id number] DOB/AGE: 1940-07-26 71 y.o.  Admit date: 07/05/2011 Discharge date: 07/06/2011  Discharge Diagnoses:  Principal Problem:  *Claudication in peripheral vascular disease, life style limiting Active Problems:  Abnormal ankle brachial index  PAD (peripheral artery disease), with history of stent to RT.SFA and now 07/05/11 cutting balloon athrectomy to Rt. SFA for instent restenosis and Stent to De novo lesion.  CAD (coronary artery disease), with LAD stent in 2006, chronically occluded OM branch with hx. of Stent to RCA   AAA (abdominal aortic aneurysm), small, history of 2.3 X 2.4   Discharged Condition: good  PROCEDURES: PV ANGIOGRAM-07/05/2011 by Dr. [name]  07/05/2011 for progression of Rt. SFA disease she underwent cutting balloon atherectomy of her "in-stent restenosis as well as PTA and stenting of her de novo lesion  By Dr. [name].     Hospital Course: Sara Dominguez is a 71 year old married Caucasian female mother of 2 children who has a history of CAD status post LAD stenting by myself back in 2006 as well as right SFA stenting several months thereafter. She smoked in the past but stopped a few years ago. I catheterized her 08/05/2009 revealing a patent LAD stent, a chronically occluded OM branch with a high-grade mid RCA stenosis. I stented her with a Taxus Ion drug-eluting stent. At that time I did demonstrate a small dilator aortic aneurysm measuring 2.3 x 2.4 cm. She has been complaining of right lower extremity pain. Recent Dopplers revealed a decrease in her right ABI from 0.99 to .75 with a high-frequency signal signal in her mid right SFA. She presented for angiography and percutaneous intervention for lifestyle limiting claudication.   She was found to have progression of her Rt. SFA disease and  underwent cutting balloon atherectomy of her "in-stent restenosis as well as PTA and stenting of her de novo  lesion  By Dr. [name]. She tolerated the procedure without problems and by the AM of 5/24/ 13 was seen and evaluated by Dr. [name], found to be stable and was discharged home.  She will follow up as outpatient.   Consults: None  Significant Diagnostic Studies:  Labs at discharge- Na 139, K+ 4.4, chloride 103, BUN 11, Cr. 0.53, Ca+ 8.8, glucose 100  Hgb 13.4, Hct 40, WBC 9.2, plts 190.   Discharge Exam: Blood pressure 149/69, pulse 70, temperature 98 F (36.7 C), temperature source Oral, resp. rate 22, height 5\' 7"  (1.702 m), weight 67.5 kg (148 lb 13 oz), SpO2 94.00%.   General appearance: alert and cooperative  Neck: no adenopathy, no carotid bruit, no JVD, supple, symmetrical, trachea midline and thyroid not enlarged, symmetric, no tenderness/mass/nodules  Lungs: clear to auscultation bilaterally  Heart: regular rate and rhythm, S1, S2 normal, no murmur, click, rub or gallop  Extremities: extremities normal, atraumatic, no cyanosis or edema  Pulses: 2+ and symmetric  2+ RPP  Disposition: 01-Home or Self Care   Medication List  As of 07/06/2011 11:58 AM   TAKE these medications         aspirin 325 MG tablet   Take 325 mg by mouth daily.      atorvastatin 40 MG tablet   Commonly known as: LIPITOR   Take 40 mg by mouth daily.      estradiol 0.05 mg/24hr   Commonly known as: CLIMARA - Dosed in mg/24 hr   Place 1 patch onto the skin once a week.      Fish Oil 1200  MG Caps   Take 1,200 mg by mouth daily.      losartan 100 MG tablet   Commonly known as: COZAAR   Take 100 mg by mouth daily.      prasugrel 10 MG Tabs   Commonly known as: EFFIENT   Take 10 mg by mouth daily.      venlafaxine 37.5 MG tablet   Commonly known as: EFFEXOR   Take 37.5 mg by mouth daily.           Follow-up Information    Follow up with Runell Gess, MD on 08/02/2011. (at  10:00  am    for lower ext arterial dopplers)    Contact information:   [address] [place] [address] [phone]       Follow up with Runell Gess, MD on 08/10/2011. (at 9:30 am follow up)    Contact information:   [address] [place] [address] [phone]        Discharge instructions:  Call [facility] if any bleeding, swelling or drainage at cath site.  May shower, no tub baths for 48 hours for groin sticks.   No Lifting for 4 days over 5 pounds  No driving for 4 days  Heart Healthy diet.  Call if any problems  Signed: Armanie Martine R 07/06/2011, 11:58 AM

## 2011-07-06 NOTE — Progress Notes (Signed)
Subjective:  No CP/SOB. No further right knee pain  Objective:  Temp:  [97 F (36.1 C)-98.4 F (36.9 C)] 98.2 F (36.8 C) (05/24 0450) Pulse Rate:  [71-77] 73  (05/24 0450) Resp:  [16-24] 24  (05/24 0450) BP: (136-159)/(74-87) 143/74 mmHg (05/24 0450) SpO2:  [96 %-99 %] 96 % (05/23 1600) Weight:  [66.679 kg (147 lb)-67.5 kg (148 lb 13 oz)] 67.5 kg (148 lb 13 oz) (05/24 0450) Weight change:   Intake/Output from previous day: 05/23 0701 - 05/24 0700 In: 765 [P.O.:240; I.V.:525] Out: -   Intake/Output from this shift:    Physical Exam: General appearance: alert and cooperative Neck: no adenopathy, no carotid bruit, no JVD, supple, symmetrical, trachea midline and thyroid not enlarged, symmetric, no tenderness/mass/nodules Lungs: clear to auscultation bilaterally Heart: regular rate and rhythm, S1, S2 normal, no murmur, click, rub or gallop Extremities: extremities normal, atraumatic, no cyanosis or edema Pulses: 2+ and symmetric 2+ RPP  Lab Results: Results for orders placed during the hospital encounter of 07/05/11 (from the past 48 hour(s))  POCT ACTIVATED CLOTTING TIME     Status: Normal   Collection Time   07/05/11  1:14 PM      Component Value Range Comment   Activated Clotting Time 314     POCT ACTIVATED CLOTTING TIME     Status: Normal   Collection Time   07/05/11  2:16 PM      Component Value Range Comment   Activated Clotting Time 226     POCT ACTIVATED CLOTTING TIME     Status: Normal   Collection Time   07/05/11  3:10 PM      Component Value Range Comment   Activated Clotting Time 188     BASIC METABOLIC PANEL     Status: Abnormal   Collection Time   07/06/11  4:20 AM      Component Value Range Comment   Sodium 139  135 - 145 (mEq/L)    Potassium 4.4  3.5 - 5.1 (mEq/L)    Chloride 103  96 - 112 (mEq/L)    CO2 27  19 - 32 (mEq/L)    Glucose, Bld 100 (*) 70 - 99 (mg/dL)    BUN 11  6 - 23 (mg/dL)    Creatinine, Ser 1.61  0.50 - 1.10 (mg/dL)    Calcium 8.8   8.4 - 10.5 (mg/dL)    GFR calc non Af Amer >90  >90 (mL/min)    GFR calc Af Amer >90  >90 (mL/min)   CBC     Status: Normal   Collection Time   07/06/11  4:20 AM      Component Value Range Comment   WBC 9.2  4.0 - 10.5 (K/uL)    RBC 4.02  3.87 - 5.11 (MIL/uL)    Hemoglobin 13.4  12.0 - 15.0 (g/dL)    HCT 09.6  04.5 - 40.9 (%)    MCV 99.5  78.0 - 100.0 (fL)    MCH 33.3  26.0 - 34.0 (pg)    MCHC 33.5  30.0 - 36.0 (g/dL)    RDW 81.1  91.4 - 78.2 (%)    Platelets 190  150 - 400 (K/uL)     Imaging: Imaging results have been reviewed  Assessment/Plan:   1. Active Problems: 2.  * No active hospital problems. *  3.   Time Spent Directly with Patient:  20 minutes  Length of Stay:  LOS: 1 day   S/P RSFR PTA/cutting balloon  atherectomy and stent RSFA for life style limiting claudication. Left groin OK. 2+ RPP. Labs OK. D/C home this AM on asa and effient. LEA then ROV 2-3 weeks.  Runell Gess 07/06/2011, 7:26 AM

## 2011-07-06 NOTE — Discharge Instructions (Signed)
Call [facility] if any bleeding, swelling or drainage at cath site.  May shower, no tub baths for 48 hours for groin sticks.   No Lifting for 4 days over 5 pounds  No driving for 4 days  Heart Healthy diet.  Call if any problems

## 2012-02-15 ENCOUNTER — Other Ambulatory Visit: Payer: Self-pay | Admitting: Family Medicine

## 2012-02-15 DIAGNOSIS — Z1231 Encounter for screening mammogram for malignant neoplasm of breast: Secondary | ICD-10-CM

## 2012-03-10 ENCOUNTER — Other Ambulatory Visit (HOSPITAL_COMMUNITY): Payer: Self-pay | Admitting: Cardiovascular Disease

## 2012-03-10 DIAGNOSIS — I739 Peripheral vascular disease, unspecified: Secondary | ICD-10-CM

## 2012-03-11 ENCOUNTER — Ambulatory Visit: Payer: Medicare Other

## 2012-03-19 ENCOUNTER — Ambulatory Visit
Admission: RE | Admit: 2012-03-19 | Discharge: 2012-03-19 | Disposition: A | Discharge status: Home / Self Care | Payer: Medicare Other | Source: Ambulatory Visit | Attending: Family Medicine | Admitting: Family Medicine

## 2012-03-19 DIAGNOSIS — Z1231 Encounter for screening mammogram for malignant neoplasm of breast: Secondary | ICD-10-CM

## 2012-03-31 ENCOUNTER — Ambulatory Visit (HOSPITAL_COMMUNITY)
Admission: RE | Admit: 2012-03-31 | Discharge: 2012-03-31 | Disposition: A | Discharge status: Home / Self Care | Payer: Medicare Other | Source: Ambulatory Visit | Attending: Cardiovascular Disease | Admitting: Cardiovascular Disease

## 2012-03-31 DIAGNOSIS — I739 Peripheral vascular disease, unspecified: Secondary | ICD-10-CM

## 2012-03-31 NOTE — Progress Notes (Signed)
Bilateral lower ext. Arterial Duplex Completed. Sara Dominguez

## 2012-07-20 ENCOUNTER — Telehealth: Payer: Self-pay | Admitting: *Deleted

## 2012-07-20 NOTE — Telephone Encounter (Signed)
Fax received asking for clearance for blepharoplasty upper eyelid with excess skin.  Dr [name] reviewed and cleared patient for surgery.  Clearance letter that was faxed to [place] was faxed back to [facility].

## 2012-08-11 ENCOUNTER — Telehealth: Payer: Self-pay | Admitting: *Deleted

## 2012-08-11 NOTE — Telephone Encounter (Signed)
Letter faxed to Dr Ether Griffins saying that effient cannot be held until 09/2012 per Dr [name]

## 2012-08-18 ENCOUNTER — Telehealth: Payer: Self-pay | Admitting: Cardiovascular Disease

## 2012-08-18 NOTE — Telephone Encounter (Signed)
Going to have eye surgery and [facility] wants her to go off of her aspirin 12 days before and effient 4 days before ., please call to let her know whether this is ok..    Thanks

## 2012-08-18 NOTE — Telephone Encounter (Signed)
Letter was completed and faxed per Neill Loft, RN.  Letter printed and faxed back w/ copy of request received today.  Returned call to pt and informed.  Pt verbalized understanding and agreed w/ plan.

## 2012-08-26 ENCOUNTER — Ambulatory Visit: Payer: Self-pay

## 2012-09-04 ENCOUNTER — Encounter: Payer: Self-pay | Admitting: Cardiovascular Disease

## 2012-09-05 ENCOUNTER — Ambulatory Visit (INDEPENDENT_AMBULATORY_CARE_PROVIDER_SITE_OTHER): Payer: Medicare Other | Admitting: Cardiology

## 2012-09-05 ENCOUNTER — Encounter: Payer: Self-pay | Admitting: Cardiology

## 2012-09-05 VITALS — BP 134/62 | HR 57 | Ht 67.0 in | Wt 146.9 lb

## 2012-09-05 DIAGNOSIS — I739 Peripheral vascular disease, unspecified: Secondary | ICD-10-CM

## 2012-09-05 DIAGNOSIS — I2510 Atherosclerotic heart disease of native coronary artery without angina pectoris: Secondary | ICD-10-CM

## 2012-09-05 DIAGNOSIS — E785 Hyperlipidemia, unspecified: Secondary | ICD-10-CM

## 2012-09-05 DIAGNOSIS — I714 Abdominal aortic aneurysm, without rupture: Secondary | ICD-10-CM

## 2012-09-05 NOTE — Patient Instructions (Signed)
Call if any chest pain, arm pain, or SOB.  See Dr. [name] in 6 months unless you have a problem before then.

## 2012-09-08 ENCOUNTER — Encounter: Payer: Self-pay | Admitting: Cardiology

## 2012-09-08 DIAGNOSIS — E785 Hyperlipidemia, unspecified: Secondary | ICD-10-CM | POA: Insufficient documentation

## 2012-09-08 HISTORY — DX: Hyperlipidemia, unspecified: E78.5

## 2012-09-08 NOTE — Assessment & Plan Note (Signed)
No claudication, last Dopplers Feb 2014 with ABIs 1.0 bil.

## 2012-09-08 NOTE — Assessment & Plan Note (Signed)
Cholesterol levels 03/20/2012 total cholesterol 131;  triglycerides 71;  HDL 62;  LDL 55-- excellent panel continue Lipitor.

## 2012-09-08 NOTE — Assessment & Plan Note (Signed)
No abdominal pain

## 2012-09-08 NOTE — Assessment & Plan Note (Addendum)
Stable without chest pain or shortness of breath.

## 2012-09-08 NOTE — Progress Notes (Signed)
09/08/2012   PCP: Neldon Labella, MD   Chief Complaint  Patient presents with  . Follow-up    6 months: No complaints    Primary Cardiologist: Dr. [name]  HPI:  72 year old white female followed by Dr. Wendie Agreste for coronary disease as well as peripheral vascular disease. Has a history of coronary disease with stent to the LAD in 2006 her cath in June of 2011 revealed a patent LAD stent and chronically occluded OM branch with a high-grade mid RCA lesion which was stented by Dr. [name] with a taxis ion drug-eluting stent. She was also found to have a small abd. aortic aneurysm at that time.  In July of 2013 developed arm pain while in [place] underwent heart catheterization at [facility] she had 2 drug-eluting stents placed in the RCA.    Peripheral disease includes stenting to the right SFA in 2006. Recurrent lower extremity pain in May 2013,  with decrease in the right ABI from 0.99-0.75 with a high-frequency signal in her right mid SFA.  Dr. [name] performed angiogram which revealed proximal right SFA lesion along with in-stent restenosis. He revascularized her percutaneously which resulted in marked improvement in her ability to ambulate and in her Dopplers. The recent lower extremity Dopplers were February of this year ABIs bilaterally were 1.0.    Her cholesterol is well-controlled with an LDL of 55 in February of this year as well on Lipitor.  Today she has no complaints no shortness of breath no chest pain.  She did undergo removal of cysts of her eyelids but remained on her Effient per Dr. Hazle Coca recommendations.  She does do water aerobics for exercise.  Please note she is not on beta blocker secondary to baseline bradycardia at 57 today.    No Known Allergies  Current Outpatient Prescriptions  Medication Sig Dispense Refill  . aspirin 325 MG tablet Take 325 mg by mouth daily.      Marland Kitchen atorvastatin (LIPITOR) 40 MG tablet Take 40 mg by mouth daily.        Marland Kitchen estradiol (CLIMARA - DOSED IN MG/24 HR) 0.05 mg/24hr Place 1 patch onto the skin once a week.      . losartan (COZAAR) 100 MG tablet Take 100 mg by mouth daily.      . Omega-3 Fatty Acids (FISH OIL) 1200 MG CAPS Take 1,200 mg by mouth daily.      . prasugrel (EFFIENT) 10 MG TABS Take 10 mg by mouth daily.       No current facility-administered medications for this visit.    Past Medical History  Diagnosis Date  . PAD (peripheral artery disease)   . Hypertension   . High cholesterol   . Anxiety   . Claudication in peripheral vascular disease, life style limiting 07/06/2011  . Abnormal ankle brachial index 07/06/2011  . CAD (coronary artery disease), with LAD stent in 2006, chronically occluded OM branch with hx. of Stent to RCA  07/06/2011  . AAA (abdominal aortic aneurysm), small, history of 2.3 X 2.4 07/06/2011  . Hyperlipidemia LDL goal < 70 09/08/2012    Past Surgical History  Procedure Laterality Date  . Coronary angioplasty with stent placement  2009; 2006    "1 + 1; total of 2"  . Peripheral arterial stent graft  2006; 07/05/11    1+1 "total of 2"  . Cataract extraction w/ intraocular lens  implant, bilateral  ~ 2006  . Shoulder open rotator cuff repair  ~  2004    left  . Lumbar disc surgery  1980;; 1982  . Abdominal hysterectomy  1992    WUJ:WJXBJYN:WG colds or fevers, no weight changes Skin:no rashes or ulcers HEENT:no blurred vision, no congestion, recent cyst removal of her eyelids without complications  CV:see HPI PUL:see HPI GI:no diarrhea constipation or melena, no indigestion GU:no hematuria, no dysuria MS:no joint pain, no claudication Neuro:no syncope, no lightheadedness Endo:no diabetes, no thyroid disease  PHYSICAL EXAM BP 134/62  Pulse 57  Ht 5\' 7"  (1.702 m)  Wt 146 lb 14.4 oz (66.633 kg)  BMI 23 kg/m2 General:Pleasant affect, NAD Skin:Warm and dry, brisk capillary refill HEENT:normocephalic, sclera clear, mucus membranes moist Neck:supple, no  JVD, no bruits  Heart:S1S2 RRR without murmur, gallup, rub or click Lungs:clear without rales, rhonchi, or wheezes NFA:OZHY, non tender, + BS, do not palpate liver spleen or masses Ext:no lower ext edema, 2+ pedal pulses, 2+ radial pulses, no joint swelling or tenderness Neuro:alert and oriented, MAE, follows commands, + facial symmetry  EKG: Sinus bradycardia rate of 57 no acute changes from previous tracing of January 2014.  ASSESSMENT AND PLAN CAD (coronary artery disease), with LAD stent in 2006, chronically occluded OM branch with hx. of Stent to RCA  Stable without chest pain or shortness of breath.  PAD (peripheral artery disease), with history of stent to RT.SFA and now 07/05/11 cutting balloon athrectomy to Rt. SFA for instent restenosis and Stent to De novo lesion. No claudication, last Dopplers Feb 2014 with ABIs 1.0 bil.  Hyperlipidemia LDL goal < 70 Cholesterol levels 03/20/2012 total cholesterol 131;  triglycerides 71;  HDL 62;  LDL 55-- excellent panel continue Lipitor.    AAA (abdominal aortic aneurysm), small, history of 2.3 X 2.4 No abdominal pain.   Patient will follow up with Dr. Gery Pray in 6 months she'll call if she develops any claudication or angina.

## 2012-09-23 ENCOUNTER — Other Ambulatory Visit: Payer: Self-pay | Admitting: Cardiovascular Disease

## 2012-09-23 NOTE — Telephone Encounter (Signed)
Rx was sent to pharmacy electronically. 

## 2013-02-20 ENCOUNTER — Other Ambulatory Visit: Payer: Self-pay

## 2013-02-20 DIAGNOSIS — Z1231 Encounter for screening mammogram for malignant neoplasm of breast: Secondary | ICD-10-CM

## 2013-03-13 ENCOUNTER — Other Ambulatory Visit (HOSPITAL_COMMUNITY): Payer: Self-pay | Admitting: Cardiovascular Disease

## 2013-03-13 ENCOUNTER — Telehealth (HOSPITAL_COMMUNITY): Payer: Self-pay | Admitting: *Deleted

## 2013-03-13 DIAGNOSIS — I739 Peripheral vascular disease, unspecified: Secondary | ICD-10-CM

## 2013-03-18 ENCOUNTER — Ambulatory Visit (HOSPITAL_COMMUNITY)
Admission: RE | Admit: 2013-03-18 | Discharge: 2013-03-18 | Disposition: A | Discharge status: Home / Self Care | Payer: Medicare Other | Source: Ambulatory Visit | Attending: Cardiovascular Disease | Admitting: Cardiovascular Disease

## 2013-03-18 DIAGNOSIS — I70219 Atherosclerosis of native arteries of extremities with intermittent claudication, unspecified extremity: Secondary | ICD-10-CM | POA: Insufficient documentation

## 2013-03-18 DIAGNOSIS — Y849 Medical procedure, unspecified as the cause of abnormal reaction of the patient, or of later complication, without mention of misadventure at the time of the procedure: Secondary | ICD-10-CM | POA: Insufficient documentation

## 2013-03-18 DIAGNOSIS — T82898A Other specified complication of vascular prosthetic devices, implants and grafts, initial encounter: Secondary | ICD-10-CM | POA: Insufficient documentation

## 2013-03-18 DIAGNOSIS — I739 Peripheral vascular disease, unspecified: Secondary | ICD-10-CM

## 2013-03-18 NOTE — Progress Notes (Signed)
Arterial Duplex Lower Ext. Completed. Taelyr Jantz, BS, RDMS, RVT  

## 2013-03-23 ENCOUNTER — Inpatient Hospital Stay: Admission: RE | Admit: 2013-03-23 | Payer: Medicare Other | Source: Ambulatory Visit

## 2013-03-25 ENCOUNTER — Ambulatory Visit
Admission: RE | Admit: 2013-03-25 | Discharge: 2013-03-25 | Disposition: A | Discharge status: Home / Self Care | Payer: Medicare Other | Source: Ambulatory Visit

## 2013-03-27 ENCOUNTER — Ambulatory Visit: Payer: Medicare Other | Admitting: Cardiovascular Disease

## 2013-03-30 ENCOUNTER — Encounter: Payer: Self-pay | Admitting: *Deleted

## 2013-04-28 ENCOUNTER — Encounter: Payer: Self-pay | Admitting: Cardiovascular Disease

## 2013-04-28 ENCOUNTER — Ambulatory Visit (INDEPENDENT_AMBULATORY_CARE_PROVIDER_SITE_OTHER): Payer: Medicare Other | Admitting: Cardiovascular Disease

## 2013-04-28 VITALS — BP 108/62 | HR 58 | Ht 67.0 in | Wt 151.9 lb

## 2013-04-28 DIAGNOSIS — Z01818 Encounter for other preprocedural examination: Secondary | ICD-10-CM

## 2013-04-28 DIAGNOSIS — R5383 Other fatigue: Secondary | ICD-10-CM

## 2013-04-28 DIAGNOSIS — I739 Peripheral vascular disease, unspecified: Secondary | ICD-10-CM

## 2013-04-28 DIAGNOSIS — E785 Hyperlipidemia, unspecified: Secondary | ICD-10-CM

## 2013-04-28 DIAGNOSIS — Z79899 Other long term (current) drug therapy: Secondary | ICD-10-CM

## 2013-04-28 DIAGNOSIS — I10 Essential (primary) hypertension: Secondary | ICD-10-CM | POA: Insufficient documentation

## 2013-04-28 DIAGNOSIS — D689 Coagulation defect, unspecified: Secondary | ICD-10-CM

## 2013-04-28 DIAGNOSIS — Z87891 Personal history of nicotine dependence: Secondary | ICD-10-CM

## 2013-04-28 DIAGNOSIS — I2510 Atherosclerotic heart disease of native coronary artery without angina pectoris: Secondary | ICD-10-CM

## 2013-04-28 DIAGNOSIS — R5381 Other malaise: Secondary | ICD-10-CM

## 2013-04-28 NOTE — Assessment & Plan Note (Signed)
On statin therapy. We'll recheck a lipid and liver profile 

## 2013-04-28 NOTE — Progress Notes (Signed)
04/28/2013 Sara Dominguez   10/23/1940  [id number]  Primary Physician Neldon LabellaMILLER,LISA LYNN, MD Primary Cardiologist: Runell GessJonathan J. Creta Dorame MD Roseanne RenoFACP,FACC,FAHA, FSCAI   HPI:  The patient is a delightful, 73 year old, thin-appearing, married Caucasian female, mother of 2, grandmother to 3 grandchildren who I last saw in the office 6 months ago. She has a history of CAD status post LAD stenting by myself in 2006, as well as right SFA stenting several months thereafter. She just stopped smoking in July of last year. I catheterized her August 05, 2009, revealing a patent LAD stent and a chronically occluded OM branch with high-grade mid RCA lesion which I stented with a Taxus Ion drug-eluting stent. At that time, I demonstrated a small abdominal aortic aneurysm measuring 2.3 x 2.4 cm. In addition, she developed recurrent lower extremity pain with Dopplers that showed a decrease in the right ABI from 0.99 to 0.75 with a high frequency signal in her mid right SFA. I angiogram'd her Jul 05, 2011, revealing proximal right SFA lesion along with in-stent restenosis. I revascularized her percutaneously which resulted in marked improvement in her ability to ambulate and in her Dopplers. [facility] [place] [facility] [place] [facility] and was [facility] by Dr. Ellis Parentsruluck radially who placed 2 drug-eluting stents in the right coronary artery.  I saw her a year ago she has seen lying on 08/31/12. She is developed lifestyle limiting right calf claudication with recent Doppler does suggest regression of disease in the mid to distal right SFA which will require re\re angiography and intervention. She otherwise denies chest pain or shortness of breath.    Current Outpatient Prescriptions  Medication Sig Dispense Refill  . aspirin 325 MG tablet Take 325 mg by mouth daily.      Marland Kitchen. atorvastatin (LIPITOR) 40 MG tablet Take 40 mg by mouth daily.      Marland Kitchen. EFFIENT 10 MG TABS tablet TAKE 1 TABLET  DAILY  30 tablet  5  . estradiol (CLIMARA - DOSED IN MG/24 HR) 0.05 mg/24hr Place 1 patch onto the skin once a week.      . losartan (COZAAR) 100 MG tablet Take 100 mg by mouth daily.      . Omega-3 Fatty Acids (FISH OIL) 1200 MG CAPS Take 1,200 mg by mouth daily.       No current facility-administered medications for this visit.    No Known Allergies  History   Social History  . Marital Status: Married    Spouse Name: N/A    Number of Children: N/A  . Years of Education: N/A   Occupational History  . Not on file.   Social History Main Topics  . Smoking status: Former Smoker -- 1.00 packs/day for 50 years    Types: Cigarettes    Quit date: 07/14/2010  . Smokeless tobacco: Never Used  . Alcohol Use: 3.6 oz/week    6 Glasses of wine per week     Comment: 07/05/11 "socially; couple times a week; 2-3 glasses of wine twice/wk"  . Drug Use: No  . Sexual Activity: Not Currently   Other Topics Concern  . Not on file   Social History Narrative  . No narrative on file     Review of Systems: General: negative for chills, fever, night sweats or weight changes.  Cardiovascular: negative for chest pain, dyspnea on exertion, edema, orthopnea, palpitations, paroxysmal nocturnal dyspnea or shortness of breath Dermatological: negative for  rash Respiratory: negative for cough or wheezing Urologic: negative for hematuria Abdominal: negative for nausea, vomiting, diarrhea, bright red blood per rectum, melena, or hematemesis Neurologic: negative for visual changes, syncope, or dizziness All other systems reviewed and are otherwise negative except as noted above.    Blood pressure 108/62, pulse 58, height 5\' 7"  (1.702 m), weight 151 lb 14.4 oz (68.901 kg).  General appearance: alert and no distress Neck: no adenopathy, no carotid bruit, no JVD, supple, symmetrical, trachea midline and thyroid not enlarged, symmetric, no tenderness/mass/nodules Lungs: clear to auscultation  bilaterally Heart: regular rate and rhythm, S1, S2 normal, no murmur, click, rub or gallop Extremities: extremities normal, atraumatic, no cyanosis or edema and 1+ right, 2+ left pedal pulses  EKG sinus bradycardia at 58 without ST or T wave changes  ASSESSMENT AND PLAN:   CAD (coronary artery disease), with LAD stent in 2006, chronically occluded OM branch with hx. of Stent to RCA  History of CAD status post LAD stenting by myself at 2006. I catheterized her 08/05/09 revealing a patent LAD stent, a chronically occluded OM branch and high-grade mid RCA stenosis which I stented with a Taxus drug-eluting stent. She underwent cardiac catheterization at [facility] by Dr. Ellis Parents 2 years ago performed radially who placed 2 drug-eluting stents in the right coronary artery. She has had no recurrent symptoms.  Essential hypertension Controlled on current medications  Hyperlipidemia LDL goal < 70 On statin therapy. We'll recheck a lipid and liver profile  PAD (peripheral artery disease), with history of stent to RT.SFA and now 07/05/11 cutting balloon athrectomy to Rt. SFA for instent restenosis and Stent to De novo lesion. History of a small abdominal aortic aneurysm or vomiting ultrasound in the mid 2 cm range. She's had right SFA stenting in the proximal midportion with redilatation of the more distal stent 07/05/11. Her Dopplers performed a year ago were fairly unremarkable with ABIs of 1 bilaterally. Dopplers performed last month revealed an ABI of 0.86 with the intercurrent development of a high-frequency signal in the distal SFA suggesting either in-stent restenosis or a de novo lesion. She is symptomatic from his MRI angiography and potential re\re intervention.      Runell Gess MD FACP,FACC,FAHA, [facility] 04/28/2013 9:05 AM

## 2013-04-28 NOTE — Assessment & Plan Note (Signed)
Controlled on current medications 

## 2013-04-28 NOTE — Patient Instructions (Signed)
Dr. Allyson SabalBerry has ordered a peripheral angi[facility] to be [facility].  This procedure is going to look at the bloodflow in your lower extremities.  If Dr. Allyson SabalBerry is able to open up the arteries, you will have to spend one night in the hospital.  If he is not able to open the arteries, you will be able to go home that same day.    After the procedure, you will not be allowed to drive for 3 days or push, pull, or lift anything greater than 10 lbs for one week.    You will be required to have the following tests prior to the procedure:  1. Blood work-the blood work can be done no more than 7 days prior to the procedure.  [facility] be [facility].  There is one downstairs on the first floor of this building and one in the [facility] ([address])  2. Chest Xray-the chest xray order has already been placed at the [facility].     *REPSScott and Winston Left groin stick

## 2013-04-28 NOTE — Assessment & Plan Note (Signed)
History of CAD status post LAD stenting by myself at 2006. I catheterized her 08/05/09 revealing a patent LAD stent, a chronically occluded OM branch and high-grade mid RCA stenosis which I stented with a Taxus drug-eluting stent. She underwent cardiac catheter[facility] at [facility] by Dr. Ellis Parentsruluck 2 years ago performed radially who placed 2 drug-eluting stents in the right coronary artery. She has had no recurrent symptoms.

## 2013-04-28 NOTE — Assessment & Plan Note (Signed)
History of a small abdominal aortic aneurysm or vomiting ultrasound in the mid 2 cm range. She's had right SFA stenting in the proximal midportion with redilatation of the more distal stent 07/05/11. Her Dopplers performed a year ago were fairly unremarkable with ABIs of 1 bilaterally. Dopplers performed last month revealed an ABI of 0.86 with the intercurrent development of a high-frequency signal in the distal SFA suggesting either in-stent restenosis or a de novo lesion. She is symptomatic from his MRI angiography and potential re\re intervention.

## 2013-05-07 ENCOUNTER — Ambulatory Visit
Admission: RE | Admit: 2013-05-07 | Discharge: 2013-05-07 | Disposition: A | Discharge status: Home / Self Care | Payer: Medicare Other | Source: Ambulatory Visit | Attending: Cardiovascular Disease | Admitting: Cardiovascular Disease

## 2013-05-07 DIAGNOSIS — Z87891 Personal history of nicotine dependence: Secondary | ICD-10-CM

## 2013-05-07 LAB — HEPATIC FUNCTION PANEL
ALBUMIN: 3.8 g/dL (ref 3.5–5.2)
ALK PHOS: 79 U/L (ref 39–117)
ALT: <8 U/L (ref 0–35)
AST: 12 U/L (ref 0–37)
Bilirubin, Direct: 0.1 mg/dL (ref 0.0–0.3)
Indirect Bilirubin: 0.5 mg/dL (ref 0.2–1.2)
Total Bilirubin: 0.6 mg/dL (ref 0.2–1.2)
Total Protein: 6.4 g/dL (ref 6.0–8.3)

## 2013-05-07 LAB — BASIC METABOLIC PANEL
BUN: 11 mg/dL (ref 6–23)
CO2: 28 mEq/L (ref 19–32)
CREATININE: 0.63 mg/dL (ref 0.50–1.10)
Calcium: 9.1 mg/dL (ref 8.4–10.5)
Chloride: 103 mEq/L (ref 96–112)
Glucose, Bld: 96 mg/dL (ref 70–99)
Potassium: 4.2 mEq/L (ref 3.5–5.3)
Sodium: 137 mEq/L (ref 135–145)

## 2013-05-07 LAB — CBC
HCT: 36.3 % (ref 36.0–46.0)
Hemoglobin: 11.8 g/dL — ABNORMAL LOW (ref 12.0–15.0)
MCH: 28.6 pg (ref 26.0–34.0)
MCHC: 32.5 g/dL (ref 30.0–36.0)
MCV: 87.9 fL (ref 78.0–100.0)
PLATELETS: 273 10*3/uL (ref 150–400)
RBC: 4.13 MIL/uL (ref 3.87–5.11)
RDW: 17.3 % — ABNORMAL HIGH (ref 11.5–15.5)
WBC: 7.6 10*3/uL (ref 4.0–10.5)

## 2013-05-07 LAB — LIPID PANEL
Cholesterol: 128 mg/dL (ref 0–200)
HDL: 57 mg/dL (ref >39)
LDL CALC: 57 mg/dL (ref 0–99)
Total CHOL/HDL Ratio: 2.2 Ratio
Triglycerides: 71 mg/dL (ref <150)
VLDL: 14 mg/dL (ref 0–40)

## 2013-05-07 LAB — TSH: TSH: 1.12 u[IU]/mL (ref 0.350–4.500)

## 2013-05-08 LAB — APTT: aPTT: 33 seconds (ref 24–37)

## 2013-05-08 LAB — PROTIME-INR
INR: 1.05 (ref <1.50)
PROTHROMBIN TIME: 13.6 s (ref 11.6–15.2)

## 2013-05-12 ENCOUNTER — Encounter: Payer: Self-pay | Admitting: *Deleted

## 2013-05-14 ENCOUNTER — Encounter (HOSPITAL_COMMUNITY)
Admission: RE | Disposition: A | Discharge status: Home / Self Care | Payer: Self-pay | Source: Ambulatory Visit | Attending: Cardiovascular Disease

## 2013-05-14 ENCOUNTER — Ambulatory Visit (HOSPITAL_COMMUNITY)
Admission: RE | Admit: 2013-05-14 | Discharge: 2013-05-15 | Disposition: A | Discharge status: Home / Self Care | Payer: Medicare Other | Source: Ambulatory Visit | Attending: Cardiovascular Disease | Admitting: Cardiovascular Disease

## 2013-05-14 ENCOUNTER — Encounter (HOSPITAL_COMMUNITY): Payer: Self-pay | Admitting: General Practice

## 2013-05-14 DIAGNOSIS — I714 Abdominal aortic aneurysm, without rupture, unspecified: Secondary | ICD-10-CM

## 2013-05-14 DIAGNOSIS — Y831 Surgical operation with implant of artificial internal device as the cause of abnormal reaction of the patient, or of later complication, without mention of misadventure at the time of the procedure: Secondary | ICD-10-CM | POA: Insufficient documentation

## 2013-05-14 DIAGNOSIS — Z01818 Encounter for other preprocedural examination: Secondary | ICD-10-CM

## 2013-05-14 DIAGNOSIS — I70219 Atherosclerosis of native arteries of extremities with intermittent claudication, unspecified extremity: Secondary | ICD-10-CM

## 2013-05-14 DIAGNOSIS — E785 Hyperlipidemia, unspecified: Secondary | ICD-10-CM | POA: Diagnosis present

## 2013-05-14 DIAGNOSIS — R6889 Other general symptoms and signs: Secondary | ICD-10-CM

## 2013-05-14 DIAGNOSIS — Z7982 Long term (current) use of aspirin: Secondary | ICD-10-CM | POA: Insufficient documentation

## 2013-05-14 DIAGNOSIS — I739 Peripheral vascular disease, unspecified: Secondary | ICD-10-CM | POA: Diagnosis present

## 2013-05-14 DIAGNOSIS — I10 Essential (primary) hypertension: Secondary | ICD-10-CM | POA: Diagnosis present

## 2013-05-14 DIAGNOSIS — Z9861 Coronary angioplasty status: Secondary | ICD-10-CM | POA: Insufficient documentation

## 2013-05-14 DIAGNOSIS — I2510 Atherosclerotic heart disease of native coronary artery without angina pectoris: Secondary | ICD-10-CM | POA: Diagnosis present

## 2013-05-14 DIAGNOSIS — Z87891 Personal history of nicotine dependence: Secondary | ICD-10-CM | POA: Insufficient documentation

## 2013-05-14 DIAGNOSIS — Z7902 Long term (current) use of antithrombotics/antiplatelets: Secondary | ICD-10-CM | POA: Insufficient documentation

## 2013-05-14 DIAGNOSIS — T82898A Other specified complication of vascular prosthetic devices, implants and grafts, initial encounter: Secondary | ICD-10-CM | POA: Insufficient documentation

## 2013-05-14 HISTORY — PX: FEMORAL ARTERY STENT: SHX1583

## 2013-05-14 HISTORY — PX: LOWER EXTREMITY ANGIOGRAM: SHX5508

## 2013-05-14 LAB — POCT ACTIVATED CLOTTING TIME
ACTIVATED CLOTTING TIME: 216 s
Activated Clotting Time: 249 seconds
Activated Clotting Time: 188 seconds

## 2013-05-14 SURGERY — ANGIOGRAM, LOWER EXTREMITY
Anesthesia: LOCAL | Laterality: Right

## 2013-05-14 MED ORDER — ASPIRIN 81 MG PO CHEW: 81.0000 mg | CHEWABLE_TABLET | ORAL | Status: DC

## 2013-05-14 MED ORDER — DIAZEPAM 5 MG PO TABS
5.0000 mg | ORAL_TABLET | ORAL | Status: AC
  Administered 2013-05-14: 5 mg via ORAL

## 2013-05-14 MED ORDER — ESTRADIOL 0.05 MG/24HR TD PTWK
0.0500 mg | MEDICATED_PATCH | TRANSDERMAL | Status: DC
  Filled 2013-05-14: qty 1

## 2013-05-14 MED ORDER — ATORVASTATIN CALCIUM 40 MG PO TABS
40.0000 mg | ORAL_TABLET | Freq: Every day | ORAL | Status: DC
  Filled 2013-05-14 (×2): qty 1

## 2013-05-14 MED ORDER — DIAZEPAM 5 MG PO TABS
ORAL_TABLET | ORAL | Status: AC
  Filled 2013-05-14: qty 1

## 2013-05-14 MED ORDER — ASPIRIN EC 325 MG PO TBEC
325.0000 mg | DELAYED_RELEASE_TABLET | Freq: Every day | ORAL | Status: DC
  Filled 2013-05-14: qty 1

## 2013-05-14 MED ORDER — SODIUM CHLORIDE 0.9 % IJ SOLN: 3.0000 mL | INTRAMUSCULAR | Status: DC | PRN

## 2013-05-14 MED ORDER — LOSARTAN POTASSIUM 50 MG PO TABS
100.0000 mg | ORAL_TABLET | Freq: Every day | ORAL | Status: DC
  Filled 2013-05-14 (×2): qty 2

## 2013-05-14 MED ORDER — HEPARIN SODIUM (PORCINE) 1000 UNIT/ML IJ SOLN
INTRAMUSCULAR | Status: AC
  Filled 2013-05-14: qty 1

## 2013-05-14 MED ORDER — ASPIRIN 325 MG PO TABS
325.0000 mg | ORAL_TABLET | Freq: Every day | ORAL | Status: DC
  Filled 2013-05-14 (×2): qty 1

## 2013-05-14 MED ORDER — HEPARIN (PORCINE) IN NACL 2-0.9 UNIT/ML-% IJ SOLN
INTRAMUSCULAR | Status: AC
  Filled 2013-05-14: qty 1000

## 2013-05-14 MED ORDER — LIDOCAINE HCL (PF) 1 % IJ SOLN
INTRAMUSCULAR | Status: AC
  Filled 2013-05-14: qty 30

## 2013-05-14 MED ORDER — MORPHINE SULFATE 2 MG/ML IJ SOLN: 2.0000 mg | INTRAMUSCULAR | Status: DC | PRN

## 2013-05-14 MED ORDER — SODIUM CHLORIDE 0.9 % IV SOLN
INTRAVENOUS | Status: DC
  Administered 2013-05-14: 10:00:00 via INTRAVENOUS

## 2013-05-14 MED ORDER — SODIUM CHLORIDE 0.9 % IV SOLN: INTRAVENOUS | Status: AC

## 2013-05-14 MED ORDER — PRASUGREL HCL 10 MG PO TABS
10.0000 mg | ORAL_TABLET | Freq: Every day | ORAL | Status: DC
  Filled 2013-05-14 (×2): qty 1

## 2013-05-14 NOTE — H&P (View-Only) (Signed)
04/28/2013 [facility]   10/23/1940  [id number]  Primary Physician Neldon LabellaMILLER,LISA LYNN, MD Primary Cardiologist: Runell GessJonathan J. Latesha Chesney MD Roseanne RenoFACP,FACC,FAHA, FSCAI   HPI:  The patient is a delightful, 73 year old, thin-appearing, married Caucasian female, mother of 2, grandmother to 3 grandchildren who I last saw in the office 6 months ago. She has a history of CAD status post LAD stenting by myself in 2006, as well as right SFA stenting several months thereafter. She just stopped smoking in July of last year. I catheterized her August 05, 2009, revealing a patent LAD stent and a chronically occluded OM branch with high-grade mid RCA lesion which I stented with a Taxus Ion drug-eluting stent. At that time, I demonstrated a small abdominal aortic aneurysm measuring 2.3 x 2.4 cm. In addition, she developed recurrent lower extremity pain with Dopplers that showed a decrease in the right ABI from 0.99 to 0.75 with a high frequency signal in her mid right SFA. I angiogram'd her Jul 05, 2011, revealing proximal right SFA lesion along with in-stent restenosis. I revascularized her percutaneously which resulted in marked improvement in her ability to ambulate and in her Dopplers. [facility] did [facility] and was cath[facility] by Dr. Ellis Parentsruluck radially who placed 2 drug-eluting stents in the right coronary artery.  I saw her a year ago she has seen lying on 08/31/12. She is developed lifestyle limiting right calf claudication with recent Doppler does suggest regression of disease in the mid to distal right SFA which will require re\re angiography and intervention. She otherwise denies chest pain or shortness of breath.    Current Outpatient Prescriptions  Medication Sig Dispense Refill  . aspirin 325 MG tablet Take 325 mg by mouth daily.      Marland Kitchen. atorvastatin (LIPITOR) 40 MG tablet Take 40 mg by mouth daily.      Marland Kitchen. EFFIENT 10 MG TABS tablet TAKE 1 TABLET  DAILY  30 tablet  5  . estradiol (CLIMARA - DOSED IN MG/24 HR) 0.05 mg/24hr Place 1 patch onto the skin once a week.      . losartan (COZAAR) 100 MG tablet Take 100 mg by mouth daily.      . Omega-3 Fatty Acids (FISH OIL) 1200 MG CAPS Take 1,200 mg by mouth daily.       No current facility-administered medications for this visit.    No Known Allergies  History   Social History  . Marital Status: Married    Spouse Name: N/A    Number of Children: N/A  . Years of Education: N/A   Occupational History  . Not on file.   Social History Main Topics  . Smoking status: Former Smoker -- 1.00 packs/day for 50 years    Types: Cigarettes    Quit date: 07/14/2010  . Smokeless tobacco: Never Used  . Alcohol Use: 3.6 oz/week    6 Glasses of wine per week     Comment: 07/05/11 "socially; couple times a week; 2-3 glasses of wine twice/wk"  . Drug Use: No  . Sexual Activity: Not Currently   Other Topics Concern  . Not on file   Social History Narrative  . No narrative on file     Review of Systems: General: negative for chills, fever, night sweats or weight changes.  Cardiovascular: negative for chest pain, dyspnea on exertion, edema, orthopnea, palpitations, paroxysmal nocturnal dyspnea or shortness of breath Dermatological: negative for  rash Respiratory: negative for cough or wheezing Urologic: negative for hematuria Abdominal: negative for nausea, vomiting, diarrhea, bright red blood per rectum, melena, or hematemesis Neurologic: negative for visual changes, syncope, or dizziness All other systems reviewed and are otherwise negative except as noted above.    Blood pressure 108/62, pulse 58, height 5\' 7"  (1.702 m), weight 151 lb 14.4 oz (68.901 kg).  General appearance: alert and no distress Neck: no adenopathy, no carotid bruit, no JVD, supple, symmetrical, trachea midline and thyroid not enlarged, symmetric, no tenderness/mass/nodules Lungs: clear to auscultation  bilaterally Heart: regular rate and rhythm, S1, S2 normal, no murmur, click, rub or gallop Extremities: extremities normal, atraumatic, no cyanosis or edema and 1+ right, 2+ left pedal pulses  EKG sinus bradycardia at 58 without ST or T wave changes  ASSESSMENT AND PLAN:   CAD (coronary artery disease), with LAD stent in 2006, chronically occluded OM branch with hx. of Stent to RCA  History of CAD status post LAD stenting by myself at 2006. I catheterized her 08/05/09 revealing a patent LAD stent, a chronically occluded OM branch and high-grade mid RCA stenosis which I stented with a Taxus drug-eluting stent. She underwent cardiac catheterization at [facility] by Dr. Ellis Parents 2 years ago performed radially who placed 2 drug-eluting stents in the right coronary artery. She has had no recurrent symptoms.  Essential hypertension Controlled on current medications  Hyperlipidemia LDL goal < 70 On statin therapy. We'll recheck a lipid and liver profile  PAD (peripheral artery disease), with history of stent to RT.SFA and now 07/05/11 cutting balloon athrectomy to Rt. SFA for instent restenosis and Stent to De novo lesion. History of a small abdominal aortic aneurysm or vomiting ultrasound in the mid 2 cm range. She's had right SFA stenting in the proximal midportion with redilatation of the more distal stent 07/05/11. Her Dopplers performed a year ago were fairly unremarkable with ABIs of 1 bilaterally. Dopplers performed last month revealed an ABI of 0.86 with the intercurrent development of a high-frequency signal in the distal SFA suggesting either in-stent restenosis or a de novo lesion. She is symptomatic from his MRI angiography and potential re\re intervention.      Runell Gess MD FACP,FACC,FAHA, [facility] 04/28/2013 9:05 AM

## 2013-05-14 NOTE — CV Procedure (Signed)
Sara Dominguez is a 73 y.o. female    [id number] LOCATION:  FACILITY: [facility]  PHYSICIAN: Nanetta Batty, M.D. [birth year]   DATE OF PROCEDURE:  05/14/2013  DATE OF DISCHARGE:     PV Angiogram/Intervention    History obtained from chart review.The patient is a delightful, 73 year old, thin-appearing, married Caucasian female, mother of 2, grandmother to 3 grandchildren who I last saw in the office 6 months ago. She has a history of CAD status post LAD stenting by myself in 2006, as well as right SFA stenting several months thereafter. She just stopped smoking in July of last year. I catheterized her August 05, 2009, revealing a patent LAD stent and a chronically occluded OM branch with high-grade mid RCA lesion which I stented with a Taxus Ion drug-eluting stent. At that time, I demonstrated a small abdominal aortic aneurysm measuring 2.3 x 2.4 cm. In addition, she developed recurrent lower extremity pain with Dopplers that showed a decrease in the right ABI from 0.99 to 0.75 with a high frequency signal in her mid right SFA. I angiogram'd her Jul 05, 2011, revealing proximal right SFA lesion along with in-stent restenosis. I revascularized her percutaneously which resulted in marked improvement in her ability to ambulate and in her Dopplers. She did develop arm pain while at [facility] in July of last year and was cath'd at [facility] by Dr. Ellis Parents radially who placed 2 drug-eluting stents in the right coronary artery.  I saw her a year ago she has seen lying on 08/31/12. She is developed lifestyle limiting right calf claudication with recent Doppler does suggest regression of disease in the mid to distal right SFA which will require re\re angiography and intervention.     PROCEDURE DESCRIPTION:   The patient was brought to the second floor [place] [facility]iac cath lab in the postabsorptive state. She was premedicated with Valium 5 mg by mouth. Her left groinwas prepped and shaved in  usual sterile fashion. Xylocaine 1% was used for local anesthesia. A 5 French sheath was inserted into the left common femoral artery using standard Seldinger technique.a 5 French pigtail catheter was placed at the level of the distal abdominal aorta. Distal abdominal aortography, bilateral iliac angiography and bifemoral runoff were performed using bolus chase digital subtraction step table technique. Visipaque allergies for the entirety of the case. Retrograde aortic pressure was monitored during the case.   HEMODYNAMICS:    AO SYSTOLIC/AO DIASTOLIC: 140/66   Angiographic Data:   1: Abdominal aortogram-the distal abdominal aorta was free of significant disease  2: Left lower extremity-essentially normal with minor irregularities in the SFA and three-vessel runoff  3: Right lower extremity-90% calcified focal lesion between the 2 previously placed Nitinol self expanding stent in the mid right SFA with moderate in-stent restenosis. There was three-vessel runoff  IMPRESSION:high-grade focal lesion mid right SFA between the 2 previously placed Nitinol stents with moderate in-stent restenosis. We'll proceed with PTA and restenting with Viabahn  covered stent.  Procedure Description:contralateral access was obtained with a crossover catheter, 7 [place] destination sheath. The patient received a total of 5000 units of heparin with an ACT of 216. Total contrast administered the patient was 130 cc. Using a 0.14/300 cm long [place] core wire and a 5 mm x 80 mm long 018 angioplasty balloon predilatation was performed. Following his stenting was performed with a 6 mm x 100 mm long ViaBahn  covered stent and postdilated with the previously used predilatation balloon. The final angiographic result  with reduction of a 95% focal lesion with moderate surrounding in-stent restenosis to 0% residual excellent flow. The patient tolerated the procedure well. The sheath was then withdrawn across the bifurcation and  exchanged over a 0.35 wire for a short 7 [place] sheath. The patient left the lab in stable condition.  Final Impression: successful PTA and restenting using a covered stent for  In-stent restenosis as well as a de novo lesion occurring between 2 previously placed Nitinol stents. The sheath will be pulled once the ACT falls below 170 and pressure will be held on the groin to achieve hemostasis. The patient will continue dual antibiotic therapy, she'll be hydrated overnight, discharged home in the morning. We will obtain followup lower extremity arterial Doppler studies in our [place] office  After which I will see her back.    Runell GessBERRY,Viveca Beckstrom J. MD, [facility] 05/14/2013 11:42 AM

## 2013-05-14 NOTE — Interval H&P Note (Signed)
History and Physical Interval Note:  05/14/2013 10:21 AM  Sara Dominguez  has presented today for surgery, with the diagnosis of claudication  The various methods of treatment have been discussed with the patient and family. After consideration of risks, benefits and other options for treatment, the patient has consented to  Procedure(s): LOWER EXTREMITY ANGIOGRAM (N/A) as a surgical intervention .  The patient's history has been reviewed, patient examined, no change in status, stable for surgery.  I have reviewed the patient's chart and labs.  Questions were answered to the patient's satisfaction.     Runell GessBERRY,[name]

## 2013-05-15 ENCOUNTER — Other Ambulatory Visit: Payer: Self-pay | Admitting: Cardiology

## 2013-05-15 ENCOUNTER — Encounter (HOSPITAL_COMMUNITY): Payer: Self-pay | Admitting: Cardiology

## 2013-05-15 DIAGNOSIS — I739 Peripheral vascular disease, unspecified: Secondary | ICD-10-CM

## 2013-05-15 DIAGNOSIS — I714 Abdominal aortic aneurysm, without rupture, unspecified: Secondary | ICD-10-CM

## 2013-05-15 DIAGNOSIS — R6889 Other general symptoms and signs: Secondary | ICD-10-CM

## 2013-05-15 DIAGNOSIS — I2510 Atherosclerotic heart disease of native coronary artery without angina pectoris: Secondary | ICD-10-CM

## 2013-05-15 LAB — CBC
HCT: 34.7 % — ABNORMAL LOW (ref 36.0–46.0)
Hemoglobin: 11.3 g/dL — ABNORMAL LOW (ref 12.0–15.0)
MCH: 29 pg (ref 26.0–34.0)
MCHC: 32.6 g/dL (ref 30.0–36.0)
MCV: 89.2 fL (ref 78.0–100.0)
PLATELETS: 255 10*3/uL (ref 150–400)
RBC: 3.89 MIL/uL (ref 3.87–5.11)
RDW: 16.8 % — AB (ref 11.5–15.5)
WBC: 8.9 10*3/uL (ref 4.0–10.5)

## 2013-05-15 LAB — BASIC METABOLIC PANEL
BUN: 10 mg/dL (ref 6–23)
CALCIUM: 8.4 mg/dL (ref 8.4–10.5)
CO2: 23 mEq/L (ref 19–32)
CREATININE: 0.64 mg/dL (ref 0.50–1.10)
Chloride: 103 mEq/L (ref 96–112)
GFR calc Af Amer: >90 mL/min (ref >90)
GFR calc non Af Amer: 87 mL/min — ABNORMAL LOW (ref >90)
GLUCOSE: 97 mg/dL (ref 70–99)
Potassium: 4.2 mEq/L (ref 3.7–5.3)
Sodium: 138 mEq/L (ref 137–147)

## 2013-05-15 MED ORDER — ASPIRIN 81 MG PO CHEW: 81.0000 mg | CHEWABLE_TABLET | Freq: Every day | ORAL | Status: DC

## 2013-05-15 NOTE — Progress Notes (Signed)
    Subjective:  No complaints overnight  Objective:  Vital Signs in the last 24 hours: Temp:  [97.7 F (36.5 C)-98.4 F (36.9 C)] 98.4 F (36.9 C) (04/03 0406) Pulse Rate:  [59-191] 69 (04/03 0406) Resp:  [17-18] 17 (04/03 0406) BP: (110-135)/(45-81) 119/48 mmHg (04/03 0406) SpO2:  [85 %-98 %] 94 % (04/03 0406) Weight:  [147 lb (66.679 kg)-149 lb 11.1 oz (67.9 kg)] 149 lb 11.1 oz (67.9 kg) (04/03 0002)  Intake/Output from previous day:  Intake/Output Summary (Last 24 hours) at 05/15/13 0635 Last data filed at 05/15/13 0004  Gross per 24 hour  Intake    350 ml  Output   1600 ml  Net  -1250 ml    Physical Exam: General appearance: alert, cooperative and no distress Lungs: clear to auscultation bilaterally Heart: regular rate and rhythm Extremities: Lt groin without hematoma   Rate: 58  Rhythm: normal sinus rhythm and sinus bradycardia  Lab Results:  Recent Labs  05/15/13 0506  WBC 8.9  HGB 11.3*  PLT 255    Recent Labs  05/15/13 0506  NA 138  K 4.2  CL 103  CO2 23  GLUCOSE 97  BUN 10  CREATININE 0.64   No results found for this basename: TROPONINI, CK, MB,  in the last 72 hours No results found for this basename: INR,  in the last 72 hours  Imaging: Imaging results have been reviewed  Cardiac Studies:  Assessment/Plan:  73 y/o adm with claudication. S/P successful RSFA PTA and restenting using a covered stent for In-stent restenosis as well as a de novo lesion occurring between 2 previously placed Nitinol stents.    Principal Problem:   Claudication in peripheral vascular disease, life style limiting Active Problems:   PAD Rt SFA stent 2006 with ISR 5/13. New RSFA stent 05/14/13   CAD- LAD stent in 2006, chronically occluded OM branch with hx. of Stent to RCA    Hyperlipidemia LDL goal < 70   Essential hypertension    PLAN: OP RLE dopplers then F/U with Dr Allyson SabalBerry.   Corine ShelterLuke Nemiah Bubar PA-C [facility] [phone] 05/15/2013, 6:35 AM

## 2013-05-15 NOTE — Discharge Instructions (Signed)
Arteriogram ° An arteriogram (or angiogram) is an X-ray test of your blood vessels. You will be awake during the test. This test looks for: °· Blocked blood vessels. °· Blood vessels that are not normal. °BEFORE THE TEST °Schedule an appointment for your arteriogram. Let the personknow if: °· You have diabetes. °· You are allergic to any food or medicine. °· You are allergic to X-ray dye (contrast). °· You have asthma or had it when you were a child. °· You are pregnant or you might be pregnant. °· You are taking aspirin or blood thinners. °The night before the test:  °· Do not  eat or drink anything after midnight. °On the morning of your test:  °· Do not drive. Have someone bring you and take you home. °· Bring all the medicines you need to take with you. If you have diabetes, do not take your insulin before the test. Take your medicines when the test is over. °· You will need to sign a form that says you agree to have the test (consent form). °THE TEST °· You will lie on the X-ray table. °· An IV tube is started in your arm. °· Your blood pressure and heartbeat are checked during the test. °· The upper part of your leg is shaved and washed with special soap. °· You are then covered with a germ free (sterile) sheet. Keep your arms at your side under the sheet at all times so that germs do not get on the sheet. °· The skin is numbed where the thin tube (catheter) is put into your upper leg. The thin tube is moved up into the blood vessels that your doctor wants to see. °· Dye is put in through the tube. You may have a feeling of heat as the dye moves into your body. °· X-ray pictures of your blood vessels are taken. Do not move while the dye goes in and pictures are taken. °AFTER THE TEST °· The thin tube will be taken out of your upper leg. °· The nurse will check: °· Your blood pressure. °· The place where the thin tube was taken out to make sure it is not bleeding. °· The blood flow to your feet. °· Lie flat in  bed. Keep your leg straight for 6 hours after the thin tube is taken out. °Finding out the results of your test °Ask when your test results will be ready. Make sure you get your test results. °Document Released: 04/27/2008 Document Revised: 04/23/2011 Document Reviewed: 04/27/2008 °[employer]® Patient Information ©2014 [employer]. ° °

## 2013-05-15 NOTE — Discharge Summary (Signed)
Agree with the note and plan to discharge. No evidence of lower extremity arterial insufficiency. Overall, doing well.

## 2013-05-15 NOTE — Discharge Summary (Signed)
Patient ID: Sara Dominguez,  MRN: [id number], DOB/AGE: [birth year] 73 y.o.  Admit date: 05/14/2013 Discharge date: 05/15/2013  Primary Care Provider: Tawanna Solo, MD Primary Cardiologist: Dr Gwenlyn Found  Discharge Diagnoses Principal Problem:   Claudication in peripheral vascular disease, life style limiting Active Problems:   PAD Rt SFA stent 2006 with ISR 5/13. New RSFA stent 05/14/13   CAD- LAD stent in 2006, chronically occluded OM branch with hx. of Stent to RCA    Hyperlipidemia LDL goal < 70   Essential hypertension    Procedures: Rt SFA PTA and stenting   Hospital Course: The patient is a 73 year old female, mother of 2, grandmother to 3 grandchildren. She has a history of CAD status post LAD stenting by in 2006, as well as right SFA stenting several months thereafter. She just stopped smoking in July of last year. She had had a cath August 05, 2009, revealing a patent LAD stent and a chronically occluded OM branch with high-grade mid RCA lesion which was stented with a Taxus Ion drug-eluting stent.  In addition, she developed lower extremity pain with Dopplers that showed a decrease in the right ABI from 0.99 to 0.75 with a high frequency signal in her mid right SFA.  Angiogram Jul 05, 2011, revealed proximal right SFA lesion along with in-stent restenosis. She was revascularized  percutaneously which resulted in marked improvement in her ability to ambulate and in her Dopplers. She did develop arm pain while at [facility] in July of last year and was cath'd at [facility] by Dr. Clydie Braun radially who placed 2 drug-eluting stents in the RCA. She has developed lifestyle limiting right calf claudication with recent Doppler does suggest regression of disease in the mid to distal right SFA which will require re\re angiography and intervention.  She was admitted for PV angiogram and further evaluation.               Angiogram 05/14/13 revealed 90% calcified focal lesion in the Rt  SFA between the 2 previously placed stents, with moderate in-stent restenosis. There was three-vessel runoff. She underwent PTA and re stenting for in stent restenosis. A stent was also placed between the 2 previously stents. She tolerated the procedure well.     Discharge Vitals:  Blood pressure 119/48, pulse 69, temperature 98.4 F (36.9 C), temperature source Oral, resp. rate 17, height '5\' 7"'  (1.702 m), weight 149 lb 11.1 oz (67.9 kg), SpO2 94.00%.    Labs: Results for orders placed during the hospital encounter of 05/14/13 (from the past 48 hour(s))  POCT ACTIVATED CLOTTING TIME     Status: None   Collection Time    05/14/13 11:04 AM      Result Value Ref Range   Activated Clotting Time 249    POCT ACTIVATED CLOTTING TIME     Status: None   Collection Time    05/14/13 11:26 AM      Result Value Ref Range   Activated Clotting Time 216    POCT ACTIVATED CLOTTING TIME     Status: None   Collection Time    05/14/13 12:07 PM      Result Value Ref Range   Activated Clotting Time 563    BASIC METABOLIC PANEL     Status: Abnormal   Collection Time    05/15/13  5:06 AM      Result Value Ref Range   Sodium 138  137 - 147 mEq/L   Potassium 4.2  3.7 - 5.3 mEq/L   Chloride 103  96 - 112 mEq/L   CO2 23  19 - 32 mEq/L   Glucose, Bld 97  70 - 99 mg/dL   BUN 10  6 - 23 mg/dL   Creatinine, Ser 0.64  0.50 - 1.10 mg/dL   Calcium 8.4  8.4 - 10.5 mg/dL   GFR calc non Af Amer 87 (*) >90 mL/min   GFR calc Af Amer >90  >90 mL/min   Comment: (NOTE)     The eGFR has been calculated using the CKD EPI equation.     This calculation has not been validated in all clinical situations.     eGFR's persistently <90 mL/min signify possible Chronic Kidney     Disease.  CBC     Status: Abnormal   Collection Time    05/15/13  5:06 AM      Result Value Ref Range   WBC 8.9  4.0 - 10.5 K/uL   RBC 3.89  3.87 - 5.11 MIL/uL   Hemoglobin 11.3 (*) 12.0 - 15.0 g/dL   HCT 34.7 (*) 36.0 - 46.0 %   MCV 89.2   78.0 - 100.0 fL   MCH 29.0  26.0 - 34.0 pg   MCHC 32.6  30.0 - 36.0 g/dL   RDW 16.8 (*) 11.5 - 15.5 %   Platelets 255  150 - 400 K/uL    Disposition:      Follow-up Information   Follow up with Lorretta Harp, MD. (office will call you)    Specialty:  Cardiology   Contact information:   [address] [place] [address] [phone]       Discharge Medications:    Medication List    STOP taking these medications       aspirin 325 MG tablet  Replaced by:  aspirin 81 MG chewable tablet      TAKE these medications       aspirin 81 MG chewable tablet  Chew 1 tablet (81 mg total) by mouth daily.     atorvastatin 40 MG tablet  Commonly known as:  LIPITOR  Take 40 mg by mouth daily.     estradiol 0.05 mg/24hr patch  Commonly known as:  CLIMARA - Dosed in mg/24 hr  Place 1 patch onto the skin once a week.     Fish Oil 1200 MG Caps  Take 1,200 mg by mouth daily.     losartan 100 MG tablet  Commonly known as:  COZAAR  Take 100 mg by mouth daily.     prasugrel 10 MG Tabs tablet  Commonly known as:  EFFIENT  Take 10 mg by mouth daily.         Duration of Discharge Encounter: Greater than 30 minutes including physician time.  Angelena Form PA-C 05/15/2013 7:50 AM

## 2013-05-15 NOTE — Progress Notes (Signed)
Doing well post procedure and eligible for discharge if ambulates with no problem.

## 2013-06-18 ENCOUNTER — Telehealth: Payer: Self-pay | Admitting: Cardiovascular Disease

## 2013-06-18 NOTE — Telephone Encounter (Signed)
Spoke with patient. States Effient is $64/month and is too expensive for her. She was previously on Plavix, but that was switched to Effient about 3 years ago (?)  She will need whatever medication she will be changed to sent to [facility] for 90 day supply.  She has enough Effient to last til June 1.   Will defer to Dr. Rockie NeighboursBerry/Kathryn, RN

## 2013-06-18 NOTE — Telephone Encounter (Signed)
Pt wants to know if there a generic for Effient or if there is something else she can take? She says it is just too expensive.

## 2013-06-23 ENCOUNTER — Other Ambulatory Visit: Payer: Self-pay | Admitting: *Deleted

## 2013-06-23 MED ORDER — CLOPIDOGREL BISULFATE 75 MG PO TABS: 75.0000 mg | ORAL_TABLET | Freq: Every day | ORAL | Status: DC

## 2013-06-23 NOTE — Telephone Encounter (Signed)
I spoke with patient and she voiced understanding about medication change and the need for the p2y12 blood work.

## 2013-06-23 NOTE — Addendum Note (Signed)
Addended byMarella Bile: Reuven Braver W. on: 06/23/2013 05:41 PM   Modules accepted: Orders, Medications

## 2013-06-23 NOTE — Telephone Encounter (Signed)
We can switch to Plavix and get a P2Y12 test

## 2013-06-23 NOTE — Telephone Encounter (Signed)
Order placed for p2y12 test and ERX [facility]

## 2013-06-26 ENCOUNTER — Other Ambulatory Visit: Payer: Self-pay | Admitting: *Deleted

## 2013-08-10 ENCOUNTER — Telehealth: Payer: Self-pay | Admitting: Cardiovascular Disease

## 2013-08-10 ENCOUNTER — Ambulatory Visit (HOSPITAL_COMMUNITY)
Admission: RE | Admit: 2013-08-10 | Discharge: 2013-08-10 | Disposition: A | Discharge status: Home / Self Care | Payer: Medicare Other | Source: Ambulatory Visit | Attending: Cardiovascular Disease | Admitting: Cardiovascular Disease

## 2013-08-10 DIAGNOSIS — Z79899 Other long term (current) drug therapy: Secondary | ICD-10-CM

## 2013-08-10 LAB — PLATELET INHIBITION P2Y12: Platelet Function  P2Y12: 38 [PRU] — ABNORMAL LOW (ref 194–418)

## 2013-08-10 NOTE — Telephone Encounter (Signed)
Sara ForestShirley (lab) informed RN no order is in system for patient- P2Y12 RN REORDERED LAB AND FAXED TO [phone]

## 2013-08-11 ENCOUNTER — Encounter: Payer: Self-pay | Admitting: *Deleted

## 2013-09-10 ENCOUNTER — Other Ambulatory Visit: Payer: Self-pay | Admitting: *Deleted

## 2013-09-10 MED ORDER — ATORVASTATIN CALCIUM 40 MG PO TABS: 40.0000 mg | ORAL_TABLET | Freq: Every day | ORAL | Status: DC

## 2013-09-10 NOTE — Telephone Encounter (Signed)
Rx was sent to pharmacy electronically. 

## 2013-09-11 ENCOUNTER — Telehealth (HOSPITAL_COMMUNITY): Payer: Self-pay | Admitting: *Deleted

## 2013-09-14 ENCOUNTER — Telehealth (HOSPITAL_COMMUNITY): Payer: Self-pay | Admitting: *Deleted

## 2013-11-18 ENCOUNTER — Encounter (HOSPITAL_COMMUNITY): Payer: Self-pay | Admitting: *Deleted

## 2013-12-04 ENCOUNTER — Ambulatory Visit (HOSPITAL_COMMUNITY)
Admission: RE | Admit: 2013-12-04 | Discharge: 2013-12-04 | Disposition: A | Discharge status: Home / Self Care | Payer: Medicare Other | Source: Ambulatory Visit | Attending: Cardiovascular Disease | Admitting: Cardiovascular Disease

## 2013-12-04 DIAGNOSIS — I739 Peripheral vascular disease, unspecified: Secondary | ICD-10-CM | POA: Diagnosis present

## 2013-12-04 NOTE — Progress Notes (Signed)
Right Lower Ext. Arterial Duplex Completed. Keyasha Miah, BS, RDMS, RVT  

## 2013-12-15 ENCOUNTER — Encounter: Payer: Self-pay | Admitting: *Deleted

## 2013-12-16 ENCOUNTER — Telehealth: Payer: Self-pay | Admitting: Cardiovascular Disease

## 2013-12-21 NOTE — Telephone Encounter (Signed)
Closed encounter °

## 2014-01-19 ENCOUNTER — Encounter: Payer: Self-pay | Admitting: Cardiovascular Disease

## 2014-01-21 ENCOUNTER — Encounter (HOSPITAL_COMMUNITY): Payer: Self-pay | Admitting: Cardiovascular Disease

## 2014-01-28 ENCOUNTER — Telehealth: Payer: Self-pay | Admitting: Cardiovascular Disease

## 2014-01-28 NOTE — Telephone Encounter (Signed)
Closed encounter °

## 2014-02-17 ENCOUNTER — Ambulatory Visit: Payer: Medicare Other | Admitting: Cardiovascular Disease

## 2014-03-10 ENCOUNTER — Other Ambulatory Visit: Payer: Self-pay

## 2014-03-10 DIAGNOSIS — Z1231 Encounter for screening mammogram for malignant neoplasm of breast: Secondary | ICD-10-CM

## 2014-03-23 ENCOUNTER — Ambulatory Visit: Payer: Medicare Other | Admitting: Cardiovascular Disease

## 2014-03-24 ENCOUNTER — Encounter: Payer: Self-pay | Admitting: Cardiovascular Disease

## 2014-03-24 ENCOUNTER — Ambulatory Visit (INDEPENDENT_AMBULATORY_CARE_PROVIDER_SITE_OTHER): Payer: PPO | Admitting: Cardiovascular Disease

## 2014-03-24 VITALS — BP 138/72 | HR 71 | Ht 67.0 in | Wt 154.6 lb

## 2014-03-24 DIAGNOSIS — I2510 Atherosclerotic heart disease of native coronary artery without angina pectoris: Secondary | ICD-10-CM

## 2014-03-24 DIAGNOSIS — E785 Hyperlipidemia, unspecified: Secondary | ICD-10-CM

## 2014-03-24 DIAGNOSIS — I739 Peripheral vascular disease, unspecified: Secondary | ICD-10-CM

## 2014-03-24 DIAGNOSIS — I10 Essential (primary) hypertension: Secondary | ICD-10-CM

## 2014-03-24 DIAGNOSIS — I2583 Coronary atherosclerosis due to lipid rich plaque: Secondary | ICD-10-CM

## 2014-03-24 NOTE — Assessment & Plan Note (Signed)
History of hypertension with blood pressure measured today 138/72. She is on losartan 100 mg a day. Continue current meds at current dosing

## 2014-03-24 NOTE — Assessment & Plan Note (Signed)
History of peripheral arterial disease status post stenting of her right superficial femoral artery in the past. She had in-stent restenosis clinically and by Ultrasound and was restudied 05/14/13 revealing a 95% stenosis in the mid right SFA between 2 previously placed stents as well as some in-stent restenosis in the distal right SFA stent with three-vessel runoff. I placed a 6 mm x 100 mm long Viabahn Covered stent with excellent angiographic and clinical result. Her follow-up lower extremity arterial Doppler studies performed 12/04/13 revealed ABIs of 1 bilaterally with a widely patent stent.

## 2014-03-24 NOTE — Assessment & Plan Note (Addendum)
History of CAD status post LAD stenting by myself in 2006. I recatheterized her 08/05/09 revealing a patent LAD stent with a chronically occluded OM branch with high-grade mid RCA stenosis which I stented with a Taxus I" drug-eluting stent. At that time I did document a small abdominal aortic aneurysm. She did have [facility] by Dr. Ellis Parentsruluck performed radially implanting 2 drug-eluting stents in her right coronary artery. She denies chest pain or shortness of breath.

## 2014-03-24 NOTE — Patient Instructions (Signed)
Your physician wants you to follow-up in 1 year with Dr. Berry. You will receive a reminder letter in the mail 2 months in advance. If you do not receive a letter, please call our office to schedule the follow-up appointment.  

## 2014-03-24 NOTE — Progress Notes (Signed)
03/24/2014 Sara Dominguez   [birth year]  [id number]  Primary Physician Neldon Labella, MD Primary Cardiologist: Sara Gess MD Sara Dominguez    HPI:  The patient is a delightful, 74year-old, thin-appearing, married Caucasian female, mother of 2, grandmother to 3 grandchildren who I last saw in the office 12 months ago. She has a history of CAD status post LAD stenting by myself in 2006, as well as right SFA stenting several months thereafter. She just stopped smoking in July of last year. I catheterized her August 05, 2009, revealing a patent LAD stent and a chronically occluded OM branch with high-grade mid RCA lesion which I stented with a Taxus Ion drug-eluting stent. At that time, I demonstrated a small abdominal aortic aneurysm measuring 2.3 x 2.4 cm. In addition, she developed recurrent lower extremity pain with Dopplers that showed a decrease in the right ABI from 0.99 to 0.75 with a high frequency signal in her mid right SFA. I angiogram'd her Jul 05, 2011, revealing proximal right SFA lesion along with in-stent restenosis. I revascularized her percutaneously which resulted in marked improvement in her ability to ambulate and in her Dopplers. She did develop arm pain while at [facility] in July of last year and was cath'd at [facility] by Dr. Ellis Parents radially who placed 2 drug-eluting stents in the right coronary artery.  Because of recurrent claudication and abnormal Doppler studies I re-angiogramed to her 05/14/13 revealing high-grade disease in the mid right SFA between 2 previously placed stents. I restarted her with a 6 mm x 100 mm long Viabahn  covered stent with excellent angiographic and clinical result. Her follow-up lower extremity arterial Doppler studies performed 12/04/13 revealed ABIs of 1 bilaterally with a widely patent stent.   Current Outpatient Prescriptions  Medication Sig Dispense Refill  . aspirin 81 MG chewable tablet Chew 1 tablet (81 mg  total) by mouth daily.    Marland Kitchen atorvastatin (LIPITOR) 40 MG tablet Take 1 tablet (40 mg total) by mouth daily. 90 tablet 2  . clopidogrel (PLAVIX) 75 MG tablet Take 1 tablet (75 mg total) by mouth daily. 90 tablet 3  . estradiol (CLIMARA - DOSED IN MG/24 HR) 0.05 mg/24hr Place 1 patch onto the skin once a week.    . losartan (COZAAR) 100 MG tablet Take 100 mg by mouth daily.    . Omega-3 Fatty Acids (FISH OIL) 1200 MG CAPS Take 1,200 mg by mouth daily.    . VESICARE 5 MG tablet Take 5 mg by mouth daily.     No current facility-administered medications for this visit.    No Known Allergies  History   Social History  . Marital Status: Married    Spouse Name: N/A  . Number of Children: N/A  . Years of Education: N/A   Occupational History  . Not on file.   Social History Main Topics  . Smoking status: Former Smoker -- 1.00 packs/day for 50 years    Types: Cigarettes    Quit date: 07/14/2010  . Smokeless tobacco: Never Used  . Alcohol Use: 3.6 oz/week    6 Glasses of wine per week     Comment: 05/14/2013  "socially; 2-3 glasses of wine twice/wk"  . Drug Use: No  . Sexual Activity: Not Currently   Other Topics Concern  . Not on file   Social History Narrative     Review of Systems: General: negative for chills, fever, night sweats or weight changes.  Cardiovascular:  negative for chest pain, dyspnea on exertion, edema, orthopnea, palpitations, paroxysmal nocturnal dyspnea or shortness of breath Dermatological: negative for rash Respiratory: negative for cough or wheezing Urologic: negative for hematuria Abdominal: negative for nausea, vomiting, diarrhea, bright red blood per rectum, melena, or hematemesis Neurologic: negative for visual changes, syncope, or dizziness All other systems reviewed and are otherwise negative except as noted above.    Blood pressure 138/72, pulse 71, height 5\' 7"  (1.702 m), weight 154 lb 9.6 oz (70.126 kg).  General appearance: alert and no  distress Neck: no adenopathy, no carotid bruit, no JVD, supple, symmetrical, trachea midline and thyroid not enlarged, symmetric, no tenderness/mass/nodules Lungs: clear to auscultation bilaterally Heart: regular rate and rhythm, S1, S2 normal, no murmur, click, rub or gallop Extremities: extremities normal, atraumatic, no cyanosis or edema  EKG normal sinus rhythm at 71 without ST or T-wave changes. I personally reviewed this EKG  ASSESSMENT AND PLAN:   PAD Rt SFA stent 2006 with ISR 5/13. New RSFA stent 05/14/13 History of peripheral arterial disease status post stenting of her right superficial femoral artery in the past. She had in-stent restenosis clinically and by Ultrasound and was restudied 05/14/13 revealing a 95% stenosis in the mid right SFA between 2 previously placed stents as well as some in-stent restenosis in the distal right SFA stent with three-vessel runoff. I placed a 6 mm x 100 mm long Viabahn Covered stent with excellent angiographic and clinical result. Her follow-up lower extremity arterial Doppler studies performed 12/04/13 revealed ABIs of 1 bilaterally with a widely patent stent.   Hyperlipidemia with target LDL less than 70 History of hyperlipidemia on atorvastatin 40 mg a day followed by her PCP   Essential hypertension History of hypertension with blood pressure measured today 138/72. She is on losartan 100 mg a day. Continue current meds at current dosing   CAD- LAD stent in 2006, chronically occluded OM branch with hx. of Stent to RCA  History of CAD status post LAD stenting by myself in 2006. I recatheterized her 08/05/09 revealing a patent LAD stent with a chronically occluded OM branch with high-grade mid RCA stenosis which I stented with a Taxus I" drug-eluting stent. At that time I did document a small abdominal aortic aneurysm. She did have [facility] by Dr. Ellis [facility] performed radially implanting 2 drug-eluting stents in  her right coronary artery. She denies chest pain or shortness of breath.       Sara GessJonathan J. Tacey Dimaggio MD FACP,FACC,FAHA, [facility] 03/24/2014 10:56 AM

## 2014-03-24 NOTE — Assessment & Plan Note (Signed)
History of hyperlipidemia on atorvastatin 40 mg a day followed by her PCP 

## 2014-03-26 ENCOUNTER — Ambulatory Visit
Admission: RE | Admit: 2014-03-26 | Discharge: 2014-03-26 | Disposition: A | Discharge status: Home / Self Care | Payer: PPO | Source: Ambulatory Visit

## 2014-03-26 DIAGNOSIS — Z1231 Encounter for screening mammogram for malignant neoplasm of breast: Secondary | ICD-10-CM

## 2014-05-03 ENCOUNTER — Other Ambulatory Visit: Payer: Self-pay | Admitting: Cardiovascular Disease

## 2014-05-03 NOTE — Telephone Encounter (Signed)
Rx has been sent to the pharmacy electronically. ° °

## 2014-05-15 ENCOUNTER — Other Ambulatory Visit: Payer: Self-pay | Admitting: Cardiovascular Disease

## 2014-05-17 NOTE — Telephone Encounter (Signed)
Rx(s) sent to pharmacy electronically.  

## 2014-09-08 ENCOUNTER — Encounter: Payer: Self-pay | Admitting: Cardiovascular Disease

## 2014-09-08 ENCOUNTER — Other Ambulatory Visit: Payer: Self-pay | Admitting: Cardiovascular Disease

## 2014-09-08 ENCOUNTER — Ambulatory Visit (INDEPENDENT_AMBULATORY_CARE_PROVIDER_SITE_OTHER): Payer: PPO | Admitting: Cardiovascular Disease

## 2014-09-08 VITALS — BP 116/70 | HR 66 | Ht 67.0 in | Wt 151.0 lb

## 2014-09-08 DIAGNOSIS — I2510 Atherosclerotic heart disease of native coronary artery without angina pectoris: Secondary | ICD-10-CM | POA: Diagnosis not present

## 2014-09-08 DIAGNOSIS — I2583 Coronary atherosclerosis due to lipid rich plaque: Secondary | ICD-10-CM

## 2014-09-08 DIAGNOSIS — I10 Essential (primary) hypertension: Secondary | ICD-10-CM | POA: Diagnosis not present

## 2014-09-08 DIAGNOSIS — I739 Peripheral vascular disease, unspecified: Secondary | ICD-10-CM

## 2014-09-08 DIAGNOSIS — E785 Hyperlipidemia, unspecified: Secondary | ICD-10-CM

## 2014-09-08 NOTE — Progress Notes (Signed)
09/08/2014 Sara Dom[facility]   1940-08-02  [id number]  Primary Physician Neldon Labella, MD Primary Cardiologist: Runell Gess MD [name]   HPI:  The patient is a delightful, 74year-old, thin-appearing, married Caucasian female, mother of 2, grandmother to 3 grandchildren who I last saw in the office 5 months ago. She has a history of CAD status post LAD stenting by myself in 2006, as well as right SFA stenting several months thereafter. She just stopped smoking in July of last year. I catheterized her August 05, 2009, revealing a patent LAD stent and a chronically occluded OM branch with high-grade mid RCA lesion which I stented with a Taxus Ion drug-eluting stent. At that time, I demonstrated a small abdominal aortic aneurysm measuring 2.3 x 2.4 cm. In addition, she developed recurrent lower extremity pain with Dopplers that showed a decrease in the right ABI from 0.99 to 0.75 with a high frequency signal in her mid right SFA. I angiogram'd her Jul 05, 2011, revealing proximal right SFA lesion along with in-stent restenosis. I revascularized her percutaneously which resulted in marked improvement in her ability to ambulate and in her Dopplers. She did develop arm pain while at [facility] in July of last year and was cath'd at [facility] by Dr. Ellis Parents radially who placed 2 drug-eluting stents in the right coronary artery.  Because of recurrent claudication and abnormal Doppler studies I re-angiogramed to her 05/14/13 revealing high-grade disease in the mid right SFA between 2 previously placed stents. I restarted her with a 6 mm x 100 mm long Viabahn covered stent with excellent angiographic and clinical result. Her follow-up lower extremity arterial Doppler studies performed 12/04/13 revealed ABIs of 1 bilaterally with a widely patent stent. Since I saw her last she is complaining of some right lateral thigh pain and tingling in her right foot as well as dysesthesia  in both upper extremities. She does have a palpable pedal pulse on the right side. I'm concerned she may have a cervical radiculopathy and suggest that she see her primary care physician and potentially get an MRI of her neck to further evaluate this.   Current Outpatient Prescriptions  Medication Sig Dispense Refill  . aspirin 81 MG chewable tablet Chew 1 tablet (81 mg total) by mouth daily.    Marland Kitchen atorvastatin (LIPITOR) 40 MG tablet Take 1 tablet (40 mg total) by mouth daily. 90 tablet 3  . clopidogrel (PLAVIX) 75 MG tablet TAKE 1 TABLET DAILY 90 tablet 2  . estradiol (CLIMARA - DOSED IN MG/24 HR) 0.05 mg/24hr Place 1 patch onto the skin once a week.    . losartan (COZAAR) 100 MG tablet Take 100 mg by mouth daily.    . Omega-3 Fatty Acids (FISH OIL) 1200 MG CAPS Take 1,200 mg by mouth daily.     No current facility-administered medications for this visit.    No Known Allergies  History   Social History  . Marital Status: Married    Spouse Name: N/A  . Number of Children: N/A  . Years of Education: N/A   Occupational History  . Not on file.   Social History Main Topics  . Smoking status: Former Smoker -- 1.00 packs/day for 50 years    Types: Cigarettes    Quit date: 07/14/2010  . Smokeless tobacco: Never Used  . Alcohol Use: 3.6 oz/week    6 Glasses of wine per week     Comment: 05/14/2013  "socially; 2-3 glasses of  wine twice/wk"  . Drug Use: No  . Sexual Activity: Not Currently   Other Topics Concern  . Not on file   Social History Narrative     Review of Systems: General: negative for chills, fever, night sweats or weight changes.  Cardiovascular: negative for chest pain, dyspnea on exertion, edema, orthopnea, palpitations, paroxysmal nocturnal dyspnea or shortness of breath Dermatological: negative for rash Respiratory: negative for cough or wheezing Urologic: negative for hematuria Abdominal: negative for nausea, vomiting, diarrhea, bright red blood per rectum,  melena, or hematemesis Neurologic: negative for visual changes, syncope, or dizziness All other systems reviewed and are otherwise negative except as noted above.    Blood pressure 116/70, pulse 66, height 5\' 7"  (1.702 m), weight 151 lb (68.493 kg).  General appearance: alert and no distress Neck: no adenopathy, no carotid bruit, no JVD, supple, symmetrical, trachea midline and thyroid not enlarged, symmetric, no tenderness/mass/nodules Lungs: clear to auscultation bilaterally Heart: regular rate and rhythm, S1, S2 normal, no murmur, click, rub or gallop Extremities: extremities normal, atraumatic, no cyanosis or edema and 2+ right pedal pulse  EKG not performed today  ASSESSMENT AND PLAN:   PAD Rt SFA stent 2006 with ISR 5/13. New RSFA stent 05/14/13 History of peripheral arterial disease status post right SFA stenting in the past with restenting 05/14/13 using a Viabahn covered stent for "in-stent restenosis. Follow-up Dopplers performed 12/04/13 revealed a right ABI of 1. Claudication resolved at that time. She does now complain of some right thigh pain and some tingling in her right foot.she has a palpable pedal pulse on the right. I am going to repeat lower extremity arterial Doppler study since she is 3 months overdue.  Hyperlipidemia with target LDL less than 70 History of hyperlipidemia or torsed and 40 mg a day. Her lipid profile performed a year ago revealed total cholesterol 128, LDL 57 HDL of 57. This is followed by her PCP  Essential hypertension History of hypertension with blood pressure measured at 116/70. She is on losartan. Continued current meds at current dosing  CAD- LAD stent in 2006, chronically occluded OM branch with hx. of Stent to RCA  History of coronary disease status post LAD stenting by myself in 2006. I re-catheter iced her 08/05/09 revealing a patent LAD stent, chronically occluded OM branch with high-grade mid RCA disease which I stented using a Taxus IN  drug-eluting stent. She did have intervention in [facility] by Dr. Ellis Parents July 2015 with 2 drug-eluting stents placed in her right coronary artery. Since I saw her last she denies chest pain or shortness of breath.      Runell Gess MD FACP,FACC,FAHA, [facility] 09/08/2014 10:14 AM

## 2014-09-08 NOTE — Assessment & Plan Note (Signed)
History of coronary disease status post LAD stenting by myself in 2006. I re-catheter iced her 08/05/09 revealing a patent LAD stent, chronically occluded OM branch with high-grade mid RCA disease which I stented using a Taxus IN drug-eluting stent. She did have intervention in [facility] by Dr. Ellis Parents July 2015 with 2 drug-eluting stents placed in her right coronary artery. Since I saw her last she denies chest pain or shortness of breath.

## 2014-09-08 NOTE — Assessment & Plan Note (Signed)
History of hypertension with blood pressure measured at 116/70. She is on losartan. Continued current meds at current dosing

## 2014-09-08 NOTE — Assessment & Plan Note (Signed)
History of hyperlipidemia or torsed and 40 mg a day. Her lipid profile performed a year ago revealed total cholesterol 128, LDL 57 HDL of 57. This is followed by her PCP

## 2014-09-08 NOTE — Patient Instructions (Signed)
Your physician wants you to follow-up in: 1 year with Dr Berry. You will receive a reminder letter in the mail two months in advance. If you don't receive a letter, please call our office to schedule the follow-up appointment.  Lower extremity arterial doppler- During this test, ultrasound is used to evaluate arterial blood flow in the legs. Allow approximately one hour for this exam.     

## 2014-09-08 NOTE — Assessment & Plan Note (Signed)
History of peripheral arterial disease status post right SFA stenting in the past with restenting 05/14/13 using a Viabahn covered stent for "in-stent restenosis. Follow-up Dopplers performed 12/04/13 revealed a right ABI of 1. Claudication resolved at that time. She does now complain of some right thigh pain and some tingling in her right foot.she has a palpable pedal pulse on the right. I am going to repeat lower extremity arterial Doppler study since she is 3 months overdue.

## 2014-09-27 ENCOUNTER — Ambulatory Visit (HOSPITAL_COMMUNITY)
Admission: RE | Admit: 2014-09-27 | Discharge: 2014-09-27 | Disposition: A | Discharge status: Home / Self Care | Payer: PPO | Source: Ambulatory Visit | Attending: Cardiovascular Disease | Admitting: Cardiovascular Disease

## 2014-09-27 DIAGNOSIS — I739 Peripheral vascular disease, unspecified: Secondary | ICD-10-CM | POA: Diagnosis not present

## 2014-09-29 ENCOUNTER — Telehealth: Payer: Self-pay | Admitting: Cardiovascular Disease

## 2014-09-29 NOTE — Telephone Encounter (Signed)
Closed encounter °

## 2014-10-13 ENCOUNTER — Encounter: Payer: Self-pay | Admitting: Cardiovascular Disease

## 2014-10-13 ENCOUNTER — Ambulatory Visit (INDEPENDENT_AMBULATORY_CARE_PROVIDER_SITE_OTHER): Payer: PPO | Admitting: Cardiovascular Disease

## 2014-10-13 VITALS — BP 158/80 | HR 58 | Ht 67.0 in | Wt 150.0 lb

## 2014-10-13 DIAGNOSIS — R5383 Other fatigue: Secondary | ICD-10-CM

## 2014-10-13 DIAGNOSIS — Z87891 Personal history of nicotine dependence: Secondary | ICD-10-CM | POA: Diagnosis not present

## 2014-10-13 DIAGNOSIS — R0989 Other specified symptoms and signs involving the circulatory and respiratory systems: Secondary | ICD-10-CM

## 2014-10-13 DIAGNOSIS — I739 Peripheral vascular disease, unspecified: Secondary | ICD-10-CM | POA: Diagnosis not present

## 2014-10-13 DIAGNOSIS — Z79899 Other long term (current) drug therapy: Secondary | ICD-10-CM

## 2014-10-13 DIAGNOSIS — I2583 Coronary atherosclerosis due to lipid rich plaque: Principal | ICD-10-CM

## 2014-10-13 DIAGNOSIS — I2510 Atherosclerotic heart disease of native coronary artery without angina pectoris: Secondary | ICD-10-CM

## 2014-10-13 DIAGNOSIS — D689 Coagulation defect, unspecified: Secondary | ICD-10-CM

## 2014-10-13 NOTE — Progress Notes (Signed)
10/13/2014 [facility] Dominguez   November 10, 1940  [id number]  Primary Physician Sara Labella, MD Primary Cardiologist: Sara Gess MD [facility]   HPI:  The patient is a delightful, 74year-old, thin-appearing, married Caucasian female, mother of 2, grandmother to 3 grandchildren who I last saw in the office 5 months ago. She has a history of CAD status post LAD stenting by myself in 2006, as well as right SFA stenting several months thereafter. She just stopped smoking in July of last year. I catheterized her August 05, 2009, revealing a patent LAD stent and a chronically occluded OM branch with high-grade mid RCA lesion which I stented with a Taxus Ion drug-eluting stent. At that time, I demonstrated a small abdominal aortic aneurysm measuring 2.3 x 2.4 cm. In addition, she developed recurrent lower extremity pain with Dopplers that showed a decrease in the right ABI from 0.99 to 0.75 with a high frequency signal in her mid right SFA. I angiogram'd her Jul 05, 2011, revealing proximal right SFA lesion along with in-stent restenosis. I revascularized her percutaneously which resulted in marked improvement in her ability to ambulate and in her Dopplers. She did develop arm pain while at [facility] in July of last year and was cath'd at [facility] by Dr. Ellis [facility] radially who placed 2 drug-eluting stents in the right coronary artery.  Because of recurrent claudication and abnormal Doppler studies I re-angiogramed to her 05/14/13 revealing high-grade disease in the mid right SFA between 2 previously placed stents. I restarted her with a 6 mm x 100 mm long Viabahn covered stent with excellent angiographic and clinical result. Her follow-up lower extremity arterial Doppler studies performed 12/04/13 revealed ABIs of 1 bilaterally with a widely patent stent. Since I saw her last she is complaining of some right lateral thigh pain and tingling in her right foot as well as dysesthesia  in both upper extremities. She does have a palpable pedal pulse on the right side. I performed a lower extremity ultrasound on her 09/27/14 revealing a high-frequency signal in the mid right SFA with a decline in her right ABI from 1.0 -.79. I think she developed a new edge lesion and will require re-angiography and intervention.  Current Outpatient Prescriptions  Medication Sig Dispense Refill  . aspirin 81 MG chewable tablet Chew 1 tablet (81 mg total) by mouth daily.    Marland Kitchen atorvastatin (LIPITOR) 40 MG tablet Take 1 tablet (40 mg total) by mouth daily. 90 tablet 3  . clopidogrel (PLAVIX) 75 MG tablet TAKE 1 TABLET DAILY 90 tablet 2  . estradiol (CLIMARA - DOSED IN MG/24 HR) 0.05 mg/24hr Place 1 patch onto the skin once a week.    . losartan (COZAAR) 100 MG tablet Take 100 mg by mouth daily.    . Omega-3 Fatty Acids (FISH OIL) 1200 MG CAPS Take 1,200 mg by mouth daily.     No current facility-administered medications for this visit.    No Known Allergies  Social History   Social History  . Marital Status: Married    Spouse Name: N/A  . Number of Children: N/A  . Years of Education: N/A   Occupational History  . Not on file.   Social History Main Topics  . Smoking status: Former Smoker -- 1.00 packs/day for 50 years    Types: Cigarettes    Quit date: 07/14/2010  . Smokeless tobacco: Never Used  . Alcohol Use: 3.6 oz/week    6 Glasses of wine  per week     Comment: 05/14/2013  "socially; 2-3 glasses of wine twice/wk"  . Drug Use: No  . Sexual Activity: Not Currently   Other Topics Concern  . Not on file   Social History Narrative     Review of Systems: General: negative for chills, fever, night sweats or weight changes.  Cardiovascular: negative for chest pain, dyspnea on exertion, edema, orthopnea, palpitations, paroxysmal nocturnal dyspnea or shortness of breath Dermatological: negative for rash Respiratory: negative for cough or wheezing Urologic: negative for  hematuria Abdominal: negative for nausea, vomiting, diarrhea, bright red blood per rectum, melena, or hematemesis Neurologic: negative for visual changes, syncope, or dizziness All other systems reviewed and are otherwise negative except as noted above.    Blood pressure 158/80, pulse 58, height  (1.702 m), weight 150 lb (68.04 kg).  General appearance: alert and no distress Neck: no adenopathy, no JVD, supple, symmetrical, trachea midline, thyroid not enlarged, symmetric, no tenderness/mass/nodules and soft right carotid bruit Lungs: clear to auscultation bilaterally Heart: regular rate and rhythm, S1, S2 normal, no murmur, click, rub or gallop Extremities: 1+ right pedal pulse  EKG not performed today  ASSESSMENT AND PLAN:   PAD Rt SFA stent 2006 with ISR 5/13. New RSFA stent 05/14/13 Mrs. Sara Dominguez returns today for follow-up of her Doppler studies performed 09/27/14. Her right ABI has decreased from 10-.79 over the last year with a new high-frequency signal in the mid right SFA. She is symptomatic. I performed stenting of her mid right SFA with a viable and covered stent/2/15. She did have 3 vessel runoff. Based on this, I have decided ide to proceed with angiography and renal intervention.      Sara Gess MD FACP,FACC,FAHA, [facility] 10/13/2014 10:31 AM

## 2014-10-13 NOTE — Patient Instructions (Addendum)
Dr. [name] has ordered a peripheral angiogram to be done at [facility].  This procedure is going to look at the bloodflow in your lower extremities.  If Dr. [name] is able to open up the arteries, you will have to spend one night in the hospital.  If he is not able to open the arteries, you will be able to go home that same day.    After the procedure, you will not be allowed to drive for 3 days or push, pull, or lift anything greater than 10 lbs for one week.    You will be required to have the following tests prior to the procedure:  1. Blood work-the blood work can be done no more than 7 days prior to the procedure.  It can be done at any [facility] lab.  There is one downstairs on the first floor of this building and one in the [facility] building [id number])  2. Chest Xray-the chest xray order has already been placed at the [facility].     *REPS Scott Left groin   Carotid Duplex- This test is an ultrasound of the carotid arteries in your neck. It looks at blood flow through these arteries that supply the brain with blood. Allow one hour for this exam. There are no restrictions or special instructions.

## 2014-10-13 NOTE — Assessment & Plan Note (Signed)
Mrs. [facility] returns today for follow-up of her Doppler studies performed 09/27/14. Her right ABI has decreased from 10-.79 over the last year with a new high-frequency signal in the mid right SFA. She is symptomatic. I performed stenting of her mid right SFA with a viable and covered stent/2/15. She did have 3 vessel runoff. Based on this, I have decided ide to proceed with angiography and renal intervention.

## 2014-11-02 ENCOUNTER — Ambulatory Visit
Admission: RE | Admit: 2014-11-02 | Discharge: 2014-11-02 | Disposition: A | Discharge status: Home / Self Care | Payer: PPO | Source: Ambulatory Visit | Attending: Cardiovascular Disease | Admitting: Cardiovascular Disease

## 2014-11-02 DIAGNOSIS — Z87891 Personal history of nicotine dependence: Secondary | ICD-10-CM

## 2014-11-03 LAB — APTT: APTT: 30 s (ref 24–37)

## 2014-11-03 LAB — CBC
HCT: 39.3 % (ref 36.0–46.0)
Hemoglobin: 12.5 g/dL (ref 12.0–15.0)
MCH: 31.3 pg (ref 26.0–34.0)
MCHC: 31.8 g/dL (ref 30.0–36.0)
MCV: 98.5 fL (ref 78.0–100.0)
MPV: 10.2 fL (ref 8.6–12.4)
PLATELETS: 280 10*3/uL (ref 150–400)
RBC: 3.99 MIL/uL (ref 3.87–5.11)
RDW: 14.9 % (ref 11.5–15.5)
WBC: 8.2 10*3/uL (ref 4.0–10.5)

## 2014-11-03 LAB — TSH: TSH: 1.576 u[IU]/mL (ref 0.350–4.500)

## 2014-11-03 LAB — BASIC METABOLIC PANEL
BUN: 12 mg/dL (ref 7–25)
CALCIUM: 9.2 mg/dL (ref 8.6–10.4)
CO2: 29 mmol/L (ref 20–31)
CREATININE: 0.55 mg/dL — AB (ref 0.60–0.93)
Chloride: 103 mmol/L (ref 98–110)
Glucose, Bld: 144 mg/dL — ABNORMAL HIGH (ref 65–99)
Potassium: 3.9 mmol/L (ref 3.5–5.3)
Sodium: 140 mmol/L (ref 135–146)

## 2014-11-03 LAB — PROTIME-INR
INR: 0.98 (ref <1.50)
PROTHROMBIN TIME: 13.1 s (ref 11.6–15.2)

## 2014-11-04 ENCOUNTER — Ambulatory Visit (HOSPITAL_COMMUNITY)
Admission: RE | Admit: 2014-11-04 | Discharge: 2014-11-04 | Disposition: A | Discharge status: Home / Self Care | Payer: PPO | Source: Ambulatory Visit | Attending: Cardiovascular Disease | Admitting: Cardiovascular Disease

## 2014-11-04 ENCOUNTER — Encounter (HOSPITAL_COMMUNITY)
Admission: RE | Disposition: A | Discharge status: Home / Self Care | Payer: Self-pay | Source: Ambulatory Visit | Attending: Cardiovascular Disease

## 2014-11-04 DIAGNOSIS — Z7902 Long term (current) use of antithrombotics/antiplatelets: Secondary | ICD-10-CM | POA: Insufficient documentation

## 2014-11-04 DIAGNOSIS — D689 Coagulation defect, unspecified: Secondary | ICD-10-CM

## 2014-11-04 DIAGNOSIS — I2583 Coronary atherosclerosis due to lipid rich plaque: Secondary | ICD-10-CM

## 2014-11-04 DIAGNOSIS — Z87891 Personal history of nicotine dependence: Secondary | ICD-10-CM | POA: Diagnosis not present

## 2014-11-04 DIAGNOSIS — I2510 Atherosclerotic heart disease of native coronary artery without angina pectoris: Secondary | ICD-10-CM | POA: Diagnosis not present

## 2014-11-04 DIAGNOSIS — Z955 Presence of coronary angioplasty implant and graft: Secondary | ICD-10-CM | POA: Insufficient documentation

## 2014-11-04 DIAGNOSIS — R5383 Other fatigue: Secondary | ICD-10-CM

## 2014-11-04 DIAGNOSIS — I70211 Atherosclerosis of native arteries of extremities with intermittent claudication, right leg: Secondary | ICD-10-CM | POA: Diagnosis not present

## 2014-11-04 DIAGNOSIS — Y812 Prosthetic and other implants, materials and accessory general- and plastic-surgery devices associated with adverse incidents: Secondary | ICD-10-CM | POA: Diagnosis not present

## 2014-11-04 DIAGNOSIS — I2582 Chronic total occlusion of coronary artery: Secondary | ICD-10-CM | POA: Insufficient documentation

## 2014-11-04 DIAGNOSIS — I714 Abdominal aortic aneurysm, without rupture: Secondary | ICD-10-CM | POA: Insufficient documentation

## 2014-11-04 DIAGNOSIS — Z7982 Long term (current) use of aspirin: Secondary | ICD-10-CM | POA: Insufficient documentation

## 2014-11-04 DIAGNOSIS — I739 Peripheral vascular disease, unspecified: Secondary | ICD-10-CM | POA: Diagnosis present

## 2014-11-04 DIAGNOSIS — T82858A Stenosis of vascular prosthetic devices, implants and grafts, initial encounter: Secondary | ICD-10-CM | POA: Diagnosis not present

## 2014-11-04 DIAGNOSIS — Z79899 Other long term (current) drug therapy: Secondary | ICD-10-CM

## 2014-11-04 DIAGNOSIS — R0989 Other specified symptoms and signs involving the circulatory and respiratory systems: Secondary | ICD-10-CM

## 2014-11-04 HISTORY — PX: PERIPHERAL VASCULAR CATHETERIZATION: SHX172C

## 2014-11-04 LAB — POCT ACTIVATED CLOTTING TIME
ACTIVATED CLOTTING TIME: 190 s
ACTIVATED CLOTTING TIME: 146 s

## 2014-11-04 SURGERY — LOWER EXTREMITY ANGIOGRAPHY
Anesthesia: LOCAL | Laterality: Right

## 2014-11-04 MED ORDER — SODIUM CHLORIDE 0.9 % WEIGHT BASED INFUSION: 1.0000 mL/kg/h | INTRAVENOUS | Status: DC

## 2014-11-04 MED ORDER — ACETAMINOPHEN 325 MG PO TABS: 650.0000 mg | ORAL_TABLET | ORAL | Status: DC | PRN

## 2014-11-04 MED ORDER — HYDRALAZINE HCL 20 MG/ML IJ SOLN
10.0000 mg | INTRAMUSCULAR | Status: DC | PRN
  Administered 2014-11-04: 10 mg via INTRAVENOUS

## 2014-11-04 MED ORDER — MORPHINE SULFATE (PF) 2 MG/ML IV SOLN: 2.0000 mg | INTRAVENOUS | Status: DC | PRN

## 2014-11-04 MED ORDER — SODIUM CHLORIDE 0.9 % IJ SOLN: 3.0000 mL | INTRAMUSCULAR | Status: DC | PRN

## 2014-11-04 MED ORDER — ONDANSETRON HCL 4 MG/2ML IJ SOLN: 4.0000 mg | Freq: Four times a day (QID) | INTRAMUSCULAR | Status: DC | PRN

## 2014-11-04 MED ORDER — MIDAZOLAM HCL 2 MG/2ML IJ SOLN
INTRAMUSCULAR | Status: AC
  Filled 2014-11-04: qty 4

## 2014-11-04 MED ORDER — HEPARIN (PORCINE) IN NACL 2-0.9 UNIT/ML-% IJ SOLN
INTRAMUSCULAR | Status: DC | PRN
  Administered 2014-11-04: 25 mL

## 2014-11-04 MED ORDER — IODIXANOL 320 MG/ML IV SOLN
INTRAVENOUS | Status: DC | PRN
  Administered 2014-11-04: 150 mL via INTRAVENOUS

## 2014-11-04 MED ORDER — HEPARIN SODIUM (PORCINE) 1000 UNIT/ML IJ SOLN
INTRAMUSCULAR | Status: DC | PRN
  Administered 2014-11-04: 5000 [IU] via INTRAVENOUS

## 2014-11-04 MED ORDER — LIDOCAINE HCL (PF) 1 % IJ SOLN
INTRAMUSCULAR | Status: AC
  Filled 2014-11-04: qty 30

## 2014-11-04 MED ORDER — HYDRALAZINE HCL 20 MG/ML IJ SOLN
INTRAMUSCULAR | Status: AC
  Filled 2014-11-04: qty 1

## 2014-11-04 MED ORDER — SODIUM CHLORIDE 0.9 % IV SOLN: INTRAVENOUS | Status: AC

## 2014-11-04 MED ORDER — CLOPIDOGREL BISULFATE 75 MG PO TABS: 75.0000 mg | ORAL_TABLET | Freq: Every day | ORAL | Status: DC

## 2014-11-04 MED ORDER — SODIUM CHLORIDE 0.9 % WEIGHT BASED INFUSION
3.0000 mL/kg/h | INTRAVENOUS | Status: DC
  Administered 2014-11-04: 3 mL/kg/h via INTRAVENOUS

## 2014-11-04 MED ORDER — FENTANYL CITRATE (PF) 100 MCG/2ML IJ SOLN
INTRAMUSCULAR | Status: AC
  Filled 2014-11-04: qty 4

## 2014-11-04 MED ORDER — HEPARIN (PORCINE) IN NACL 2-0.9 UNIT/ML-% IJ SOLN
INTRAMUSCULAR | Status: AC
  Filled 2014-11-04: qty 1000

## 2014-11-04 MED ORDER — FENTANYL CITRATE (PF) 100 MCG/2ML IJ SOLN
INTRAMUSCULAR | Status: DC | PRN
  Administered 2014-11-04: 25 ug via INTRAVENOUS

## 2014-11-04 MED ORDER — HEPARIN SODIUM (PORCINE) 1000 UNIT/ML IJ SOLN
INTRAMUSCULAR | Status: AC
  Filled 2014-11-04: qty 1

## 2014-11-04 MED ORDER — ASPIRIN EC 325 MG PO TBEC: 325.0000 mg | DELAYED_RELEASE_TABLET | Freq: Every day | ORAL | Status: DC

## 2014-11-04 MED ORDER — MIDAZOLAM HCL 2 MG/2ML IJ SOLN
INTRAMUSCULAR | Status: DC | PRN
  Administered 2014-11-04: 1 mg via INTRAVENOUS

## 2014-11-04 MED ORDER — ASPIRIN 81 MG PO CHEW: 81.0000 mg | CHEWABLE_TABLET | ORAL | Status: DC

## 2014-11-04 SURGICAL SUPPLY — 18 items
BALLOON FOX SV 6X20 (BALLOONS) ×1 IMPLANT
BALLOON SABER 5.0X20X150 (BALLOONS) ×1 IMPLANT
CATH ANGIO 5F PIGTAIL 65CM (CATHETERS) ×1 IMPLANT
CATH CROSS OVER TEMPO 5F (CATHETERS) ×1 IMPLANT
DEVICE CONTINUOUS FLUSH (MISCELLANEOUS) ×1 IMPLANT
KIT ENCORE 26 ADVANTAGE (KITS) ×1 IMPLANT
KIT PV (KITS) ×3 IMPLANT
SHEATH HIGHFLEX ANSEL 7FR 55CM (SHEATH) ×1 IMPLANT
SHEATH PINNACLE 5F 10CM (SHEATH) ×1 IMPLANT
SHEATH PINNACLE 7F 10CM (SHEATH) ×1 IMPLANT
STENT VIABAHN 6X25X120 (Permanent Stent) ×1 IMPLANT
SYRINGE MEDRAD AVANTA MACH 7 (SYRINGE) ×1 IMPLANT
TAPE RADIOPAQUE TURBO (MISCELLANEOUS) ×1 IMPLANT
TRANSDUCER W/STOPCOCK (MISCELLANEOUS) ×3 IMPLANT
TRAY PV CATH (CUSTOM PROCEDURE TRAY) ×3 IMPLANT
TUBING CIL FLEX 10 FLL-RA (TUBING) ×1 IMPLANT
WIRE HITORQ VERSACORE ST 145CM (WIRE) ×1 IMPLANT
WIRE SPARTACORE .014X300CM (WIRE) ×1 IMPLANT

## 2014-11-04 NOTE — Progress Notes (Signed)
Site area: left groin fa sheath Site Prior to Removal:  Level 0 Pressure Applied For:  20 minutes Manual:   yes Patient Status During Pull:  stable Post Pull Site:  Level  0 Post Pull Instructions Given:  yes Post Pull Pulses Present: yes Dressing Applied:  tegaderm Bedrest begins @  1105 Comments:  0

## 2014-11-04 NOTE — H&P (View-Only) (Signed)
10/13/2014 Sara Dominguez [facility]   November 10, 1940  [id number]  Primary Physician Neldon Labella, MD Primary Cardiologist: Runell Gess MD [name]   HPI:  The patient is a delightful, 74year-old, thin-appearing, married Caucasian female, mother of 2, grandmother to 3 grandchildren who I last saw in the office 5 months ago. She has a history of CAD status post LAD stenting by myself in 2006, as well as right SFA stenting several months thereafter. She just stopped smoking in July of last year. I catheterized her August 05, 2009, revealing a patent LAD stent and a chronically occluded OM branch with high-grade mid RCA lesion which I stented with a Taxus Ion drug-eluting stent. At that time, I demonstrated a small abdominal aortic aneurysm measuring 2.3 x 2.4 cm. In addition, she developed recurrent lower extremity pain with Dopplers that showed a decrease in the right ABI from 0.99 to 0.75 with a high frequency signal in her mid right SFA. I angiogram'd her Jul 05, 2011, revealing proximal right SFA lesion along with in-stent restenosis. I revascularized her percutaneously which resulted in marked improvement in her ability to ambulate and in her Dopplers. She did develop arm pain while at [facility] in July of last year and was cath'd at [facility] by Dr. Ellis Parents radially who placed 2 drug-eluting stents in the right coronary artery.  Because of recurrent claudication and abnormal Doppler studies I re-angiogramed to her 05/14/13 revealing high-grade disease in the mid right SFA between 2 previously placed stents. I restarted her with a 6 mm x 100 mm long Viabahn covered stent with excellent angiographic and clinical result. Her follow-up lower extremity arterial Doppler studies performed 12/04/13 revealed ABIs of 1 bilaterally with a widely patent stent. Since I saw her last she is complaining of some right lateral thigh pain and tingling in her right foot as well as dysesthesia  in both upper extremities. She does have a palpable pedal pulse on the right side. I performed a lower extremity ultrasound on her 09/27/14 revealing a high-frequency signal in the mid right SFA with a decline in her right ABI from 1.0 -.79. I think she developed a new edge lesion and will require re-angiography and intervention.  Current Outpatient Prescriptions  Medication Sig Dispense Refill  . aspirin 81 MG chewable tablet Chew 1 tablet (81 mg total) by mouth daily.    Marland Kitchen atorvastatin (LIPITOR) 40 MG tablet Take 1 tablet (40 mg total) by mouth daily. 90 tablet 3  . clopidogrel (PLAVIX) 75 MG tablet TAKE 1 TABLET DAILY 90 tablet 2  . estradiol (CLIMARA - DOSED IN MG/24 HR) 0.05 mg/24hr Place 1 patch onto the skin once a week.    . losartan (COZAAR) 100 MG tablet Take 100 mg by mouth daily.    . Omega-3 Fatty Acids (FISH OIL) 1200 MG CAPS Take 1,200 mg by mouth daily.     No current facility-administered medications for this visit.    No Known Allergies  Social History   Social History  . Marital Status: Married    Spouse Name: N/A  . Number of Children: N/A  . Years of Education: N/A   Occupational History  . Not on file.   Social History Main Topics  . Smoking status: Former Smoker -- 1.00 packs/day for 50 years    Types: Cigarettes    Quit date: 07/14/2010  . Smokeless tobacco: Never Used  . Alcohol Use: 3.6 oz/week    6 Glasses of wine  per week     Comment: 05/14/2013  "socially; 2-3 glasses of wine twice/wk"  . Drug Use: No  . Sexual Activity: Not Currently   Other Topics Concern  . Not on file   Social History Narrative     Review of Systems: General: negative for chills, fever, night sweats or weight changes.  Cardiovascular: negative for chest pain, dyspnea on exertion, edema, orthopnea, palpitations, paroxysmal nocturnal dyspnea or shortness of breath Dermatological: negative for rash Respiratory: negative for cough or wheezing Urologic: negative for  hematuria Abdominal: negative for nausea, vomiting, diarrhea, bright red blood per rectum, melena, or hematemesis Neurologic: negative for visual changes, syncope, or dizziness All other systems reviewed and are otherwise negative except as noted above.    Blood pressure 158/80, pulse 58, height 5' 7" (1.702 m), weight 150 lb (68.04 kg).  General appearance: alert and no distress Neck: no adenopathy, no JVD, supple, symmetrical, trachea midline, thyroid not enlarged, symmetric, no tenderness/mass/nodules and soft right carotid bruit Lungs: clear to auscultation bilaterally Heart: regular rate and rhythm, S1, S2 normal, no murmur, click, rub or gallop Extremities: 1+ right pedal pulse  EKG not performed today  ASSESSMENT AND PLAN:   PAD Rt SFA stent 2006 with ISR 5/13. New RSFA stent 05/14/13 [name] returns today for follow-up of her Doppler studies performed 09/27/14. Her right ABI has decreased from 10-.79 over the last year with a new high-frequency signal in the mid right SFA. She is symptomatic. I performed stenting of her mid right SFA with a viable and covered stent/2/15. She did have 3 vessel runoff. Based on this, I have decided ide to proceed with angiography and renal intervention.      Yaris Ferrell J. Genita Nilsson MD FACP,FACC,FAHA, FSCAI 10/13/2014 10:31 AM  

## 2014-11-04 NOTE — Discharge Instructions (Signed)

## 2014-11-04 NOTE — Interval H&P Note (Signed)
History and Physical Interval Note:  11/04/2014 7:43 AM  [facility]  has presented today for surgery, with the diagnosis of pvd  The various methods of treatment have been discussed with the patient and family. After consideration of risks, benefits and other options for treatment, the patient has consented to  Procedure(s): Lower Extremity Angiography (N/A) as a surgical intervention .  The patient's history has been reviewed, patient examined, no change in status, stable for surgery.  I have reviewed the patient's chart and labs.  Questions were answered to the patient's satisfaction.     Nanetta Batty

## 2014-11-05 ENCOUNTER — Telehealth: Payer: Self-pay | Admitting: Cardiovascular Disease

## 2014-11-05 ENCOUNTER — Encounter (HOSPITAL_COMMUNITY): Payer: Self-pay | Admitting: Cardiovascular Disease

## 2014-11-05 LAB — POCT ACTIVATED CLOTTING TIME
ACTIVATED CLOTTING TIME: 245 s
ACTIVATED CLOTTING TIME: 208 s

## 2014-11-05 NOTE — Telephone Encounter (Signed)
Patient called and let her know her lab results are normal

## 2014-11-05 NOTE — Telephone Encounter (Signed)
[facility] is calling about her lab results ,Please call   Thanks

## 2014-11-17 ENCOUNTER — Other Ambulatory Visit: Payer: Self-pay | Admitting: Cardiovascular Disease

## 2014-11-17 DIAGNOSIS — I739 Peripheral vascular disease, unspecified: Secondary | ICD-10-CM

## 2014-11-23 ENCOUNTER — Encounter (HOSPITAL_COMMUNITY): Payer: PPO

## 2014-11-23 ENCOUNTER — Other Ambulatory Visit: Payer: Self-pay | Admitting: Cardiovascular Disease

## 2014-11-23 DIAGNOSIS — I739 Peripheral vascular disease, unspecified: Secondary | ICD-10-CM

## 2014-11-25 ENCOUNTER — Ambulatory Visit (HOSPITAL_COMMUNITY)
Admission: RE | Admit: 2014-11-25 | Discharge: 2014-11-25 | Disposition: A | Discharge status: Home / Self Care | Payer: PPO | Source: Ambulatory Visit | Attending: Cardiovascular Disease | Admitting: Cardiovascular Disease

## 2014-11-25 ENCOUNTER — Encounter (HOSPITAL_COMMUNITY): Payer: PPO

## 2014-11-25 ENCOUNTER — Ambulatory Visit (HOSPITAL_BASED_OUTPATIENT_CLINIC_OR_DEPARTMENT_OTHER)
Admission: RE | Admit: 2014-11-25 | Discharge: 2014-11-25 | Disposition: A | Discharge status: Home / Self Care | Payer: PPO | Source: Ambulatory Visit | Attending: Cardiovascular Disease | Admitting: Cardiovascular Disease

## 2014-11-25 DIAGNOSIS — I10 Essential (primary) hypertension: Secondary | ICD-10-CM | POA: Insufficient documentation

## 2014-11-25 DIAGNOSIS — I739 Peripheral vascular disease, unspecified: Secondary | ICD-10-CM

## 2014-11-25 DIAGNOSIS — E785 Hyperlipidemia, unspecified: Secondary | ICD-10-CM | POA: Diagnosis not present

## 2014-11-25 DIAGNOSIS — I2510 Atherosclerotic heart disease of native coronary artery without angina pectoris: Secondary | ICD-10-CM | POA: Insufficient documentation

## 2014-11-25 DIAGNOSIS — I6523 Occlusion and stenosis of bilateral carotid arteries: Secondary | ICD-10-CM | POA: Diagnosis not present

## 2014-11-25 DIAGNOSIS — R0989 Other specified symptoms and signs involving the circulatory and respiratory systems: Secondary | ICD-10-CM | POA: Diagnosis not present

## 2014-11-25 DIAGNOSIS — Z87891 Personal history of nicotine dependence: Secondary | ICD-10-CM | POA: Diagnosis not present

## 2014-12-03 ENCOUNTER — Telehealth: Payer: Self-pay

## 2014-12-03 DIAGNOSIS — E785 Hyperlipidemia, unspecified: Secondary | ICD-10-CM

## 2014-12-03 DIAGNOSIS — I739 Peripheral vascular disease, unspecified: Secondary | ICD-10-CM

## 2014-12-03 NOTE — Telephone Encounter (Signed)
Pt made aware of carotid and ABI results.  Orders placed for future testing ABI - April 2017 Carotids- 11/2015

## 2014-12-03 NOTE — Telephone Encounter (Signed)
-----   Message from Runell GessJonathan J Berry, MD sent at 11/26/2014  3:04 PM EDT ----- Mild RICA stenosis. Repeat 12 months

## 2015-01-26 ENCOUNTER — Other Ambulatory Visit: Payer: Self-pay | Admitting: Cardiovascular Disease

## 2015-03-14 ENCOUNTER — Other Ambulatory Visit: Payer: Self-pay

## 2015-03-14 DIAGNOSIS — Z1231 Encounter for screening mammogram for malignant neoplasm of breast: Secondary | ICD-10-CM

## 2015-03-31 ENCOUNTER — Ambulatory Visit: Payer: PPO

## 2015-04-06 ENCOUNTER — Ambulatory Visit
Admission: RE | Admit: 2015-04-06 | Discharge: 2015-04-06 | Disposition: A | Discharge status: Home / Self Care | Payer: PPO | Source: Ambulatory Visit

## 2015-04-06 DIAGNOSIS — Z1231 Encounter for screening mammogram for malignant neoplasm of breast: Secondary | ICD-10-CM | POA: Diagnosis not present

## 2015-05-06 ENCOUNTER — Other Ambulatory Visit: Payer: Self-pay | Admitting: Cardiovascular Disease

## 2015-05-06 ENCOUNTER — Telehealth: Payer: Self-pay | Admitting: Cardiovascular Disease

## 2015-05-06 MED ORDER — LOSARTAN POTASSIUM 100 MG PO TABS: 100.0000 mg | ORAL_TABLET | Freq: Every day | ORAL | Status: DC

## 2015-05-06 MED ORDER — ATORVASTATIN CALCIUM 40 MG PO TABS: 40.0000 mg | ORAL_TABLET | Freq: Every day | ORAL | Status: DC

## 2015-05-06 NOTE — Telephone Encounter (Signed)
New message   Pt calling to speak to rn to get as prescription for Losartan

## 2015-05-06 NOTE — Telephone Encounter (Signed)
[facility] local and [facility] written for losartan, patient aware.

## 2015-05-30 ENCOUNTER — Other Ambulatory Visit: Payer: Self-pay | Admitting: Cardiovascular Disease

## 2015-05-30 DIAGNOSIS — E785 Hyperlipidemia, unspecified: Secondary | ICD-10-CM

## 2015-05-30 DIAGNOSIS — I739 Peripheral vascular disease, unspecified: Secondary | ICD-10-CM

## 2015-06-08 ENCOUNTER — Ambulatory Visit (HOSPITAL_COMMUNITY)
Admission: RE | Admit: 2015-06-08 | Discharge: 2015-06-08 | Disposition: A | Discharge status: Home / Self Care | Payer: PPO | Source: Ambulatory Visit | Attending: Cardiology | Admitting: Cardiology

## 2015-06-08 DIAGNOSIS — E785 Hyperlipidemia, unspecified: Secondary | ICD-10-CM | POA: Insufficient documentation

## 2015-06-08 DIAGNOSIS — R938 Abnormal findings on diagnostic imaging of other specified body structures: Secondary | ICD-10-CM | POA: Insufficient documentation

## 2015-06-08 DIAGNOSIS — I739 Peripheral vascular disease, unspecified: Secondary | ICD-10-CM | POA: Insufficient documentation

## 2015-06-08 DIAGNOSIS — I10 Essential (primary) hypertension: Secondary | ICD-10-CM | POA: Diagnosis not present

## 2015-06-15 ENCOUNTER — Telehealth: Payer: Self-pay

## 2015-06-15 DIAGNOSIS — I739 Peripheral vascular disease, unspecified: Secondary | ICD-10-CM

## 2015-06-15 NOTE — Telephone Encounter (Signed)
-----   Message from Runell GessJonathan J Berry, MD sent at 06/11/2015  3:28 PM EDT ----- No change from prior study. Repeat in 6 months

## 2015-08-26 DIAGNOSIS — Z87891 Personal history of nicotine dependence: Secondary | ICD-10-CM | POA: Diagnosis not present

## 2015-08-26 DIAGNOSIS — I10 Essential (primary) hypertension: Secondary | ICD-10-CM | POA: Diagnosis not present

## 2015-08-26 DIAGNOSIS — E7800 Pure hypercholesterolemia, unspecified: Secondary | ICD-10-CM | POA: Diagnosis not present

## 2015-08-26 DIAGNOSIS — Z0000 Encounter for general adult medical examination without abnormal findings: Secondary | ICD-10-CM | POA: Diagnosis not present

## 2015-08-26 DIAGNOSIS — M859 Disorder of bone density and structure, unspecified: Secondary | ICD-10-CM | POA: Diagnosis not present

## 2015-08-26 DIAGNOSIS — I2510 Atherosclerotic heart disease of native coronary artery without angina pectoris: Secondary | ICD-10-CM | POA: Diagnosis not present

## 2015-08-26 DIAGNOSIS — I739 Peripheral vascular disease, unspecified: Secondary | ICD-10-CM | POA: Diagnosis not present

## 2015-10-28 ENCOUNTER — Other Ambulatory Visit: Payer: Self-pay | Admitting: Cardiovascular Disease

## 2015-10-28 NOTE — Telephone Encounter (Signed)
Rx request sent to pharmacy.  

## 2015-11-14 ENCOUNTER — Other Ambulatory Visit: Payer: Self-pay | Admitting: Cardiovascular Disease

## 2015-11-14 DIAGNOSIS — I739 Peripheral vascular disease, unspecified: Secondary | ICD-10-CM

## 2015-11-14 IMAGING — CR DG CHEST 2V
2 series · 2 of 2 positions shown · non-contrast
Comparison: 05/07/2013.

CLINICAL DATA: Pre cardiac catheterization evaluation.  Ex-smoker.

EXAM:
CHEST  2 VIEW

[w chest lat]
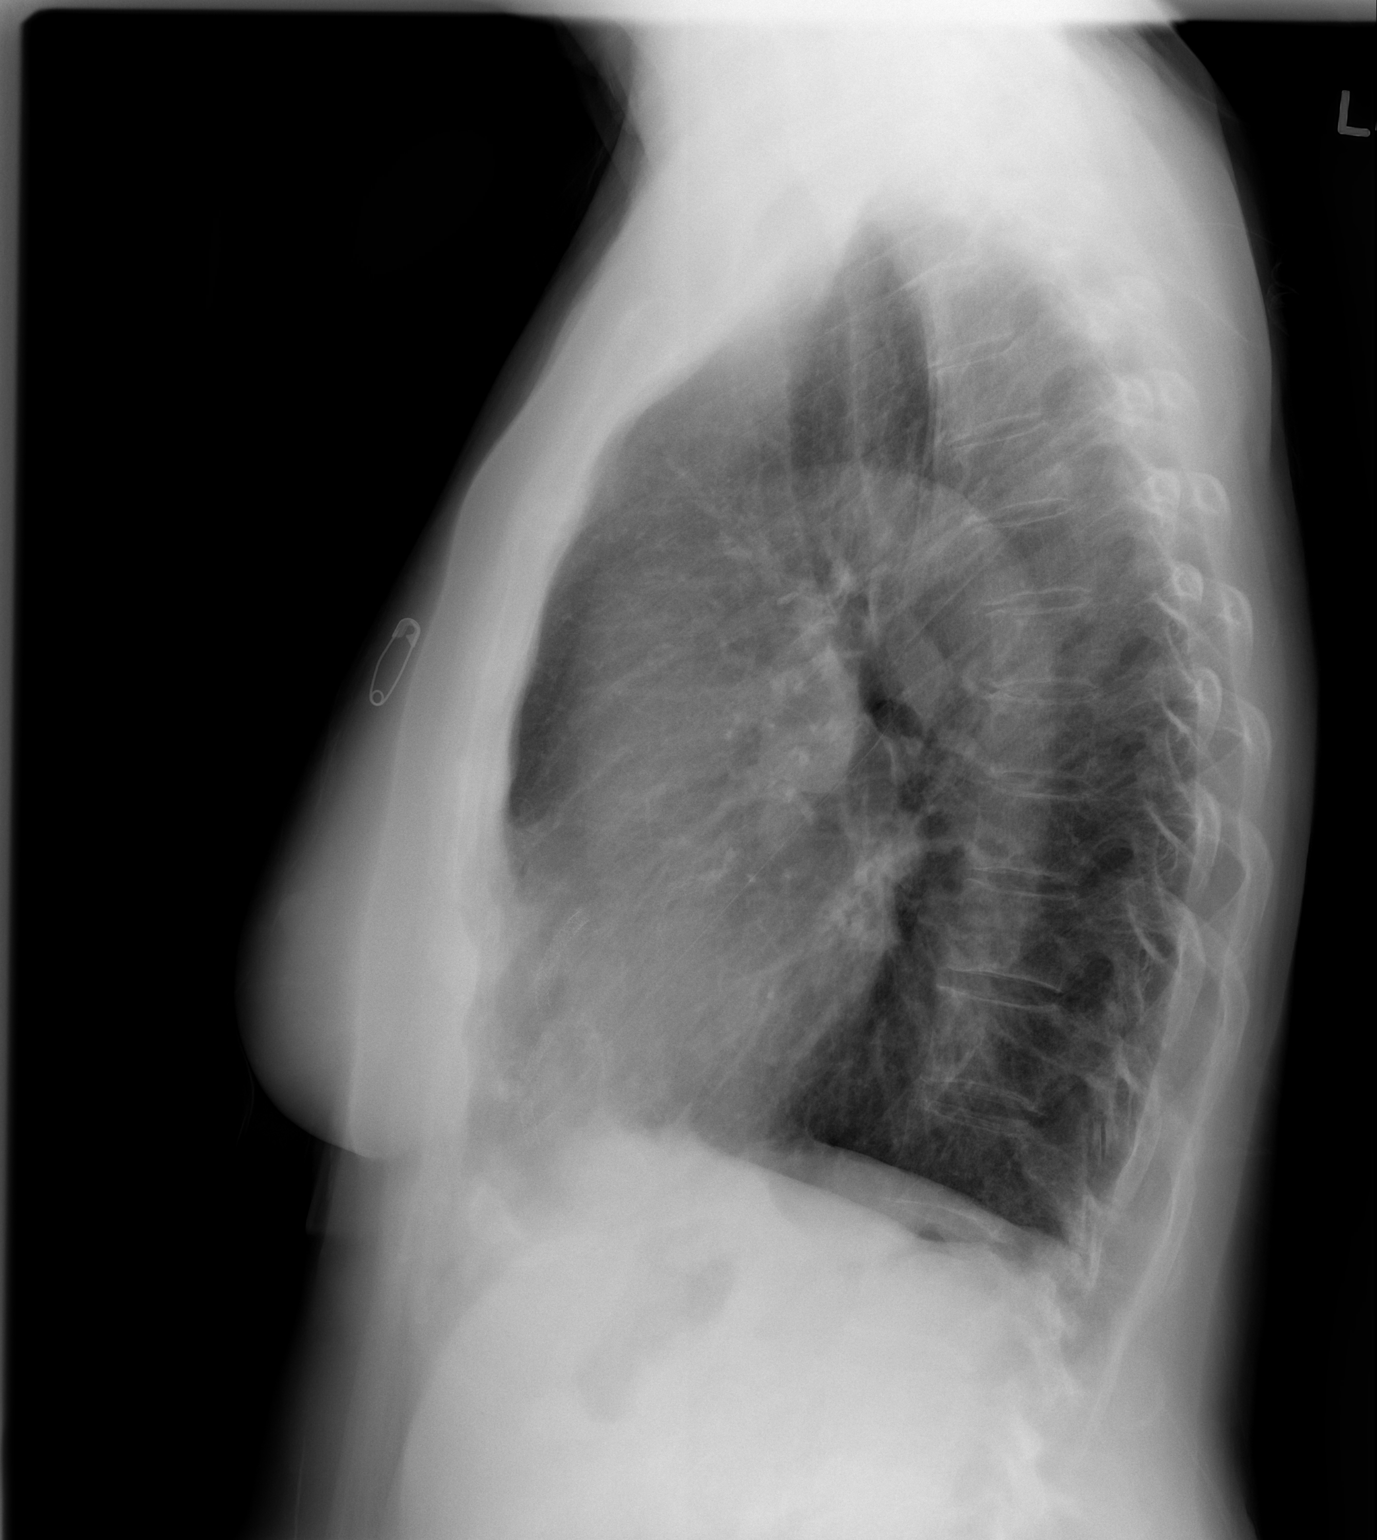

[w chest pa]
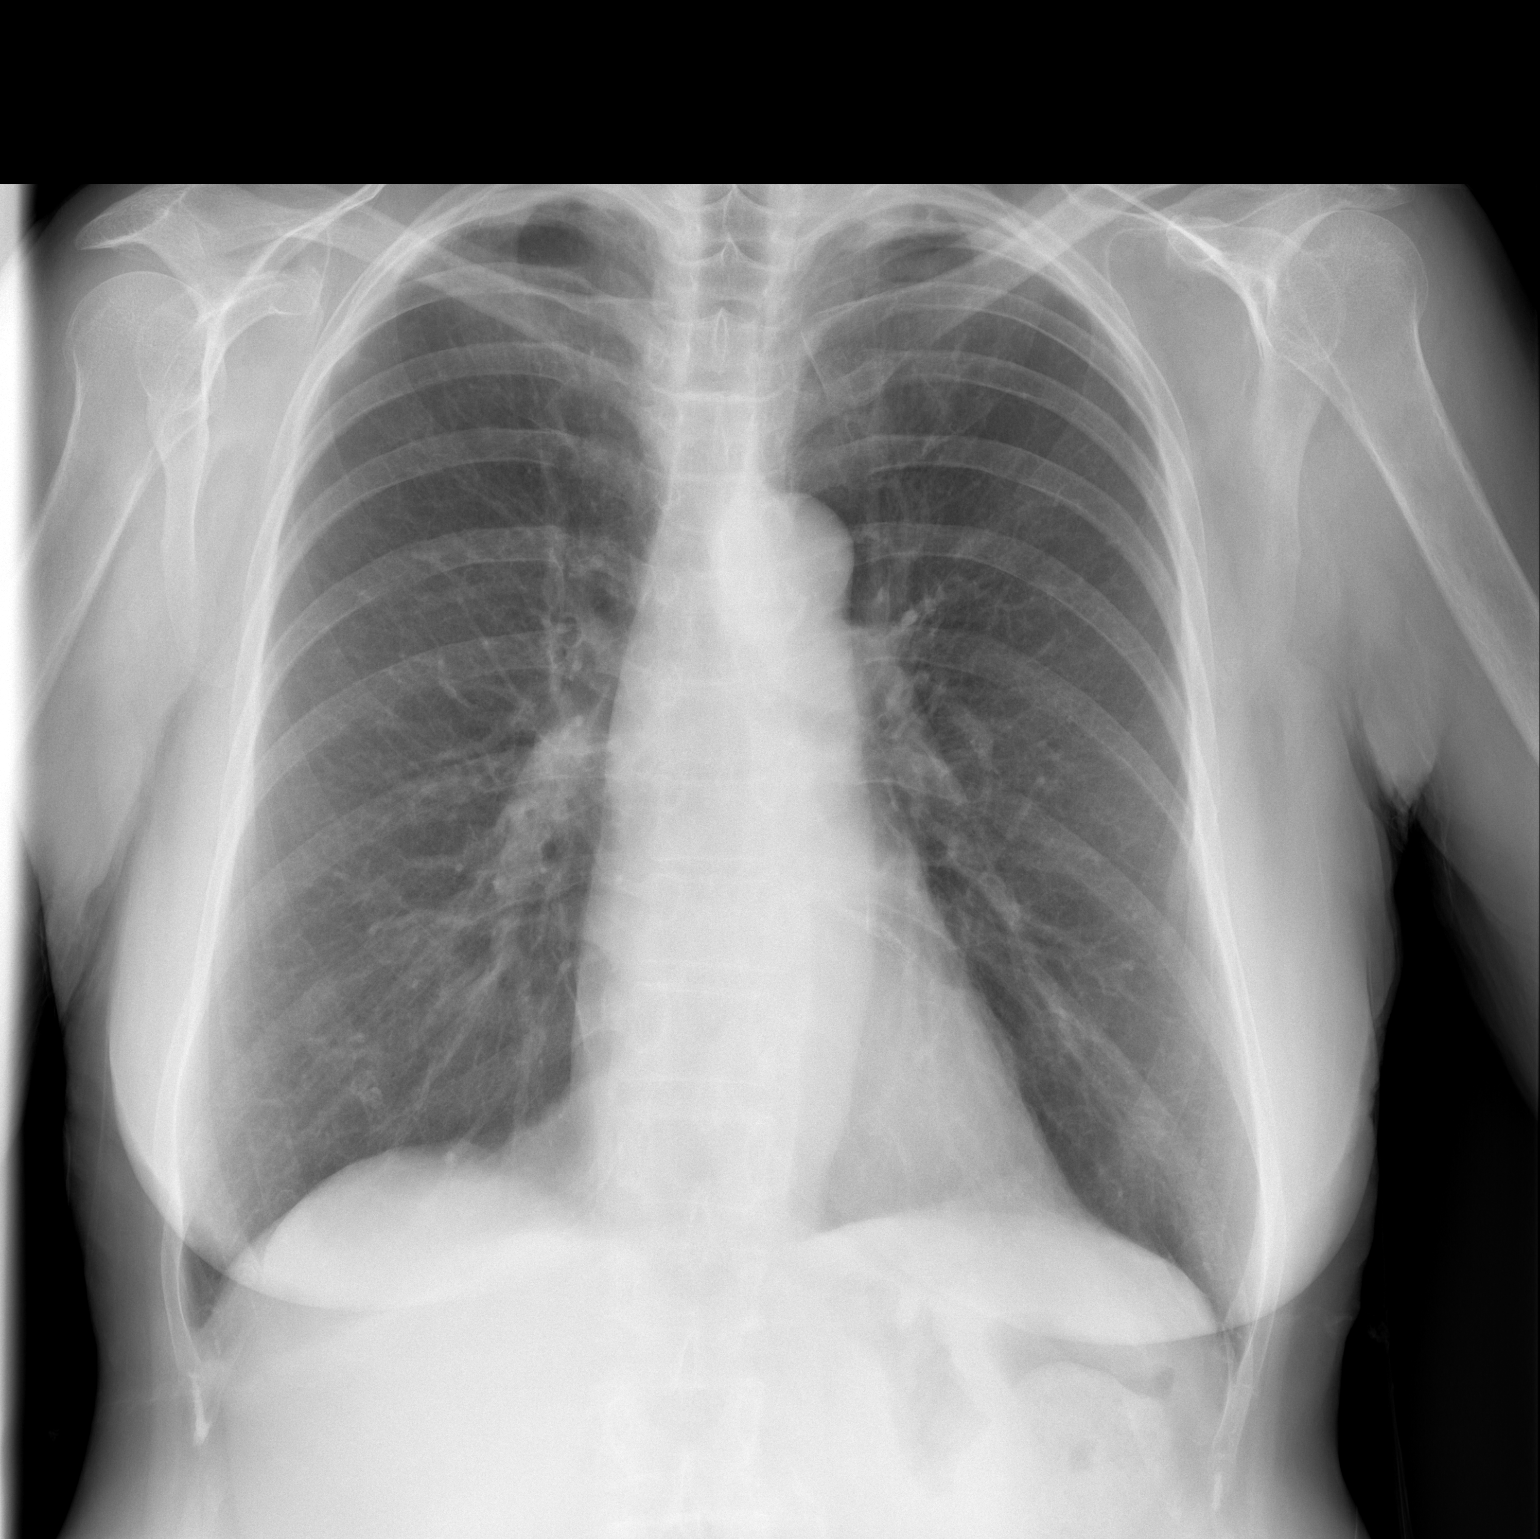

[2 of 2 positions shown; findings below may reference images not displayed]

FINDINGS: Normal sized heart. Clear lungs. The lungs remain hyperexpanded.
Minimal thoracic spine degenerative changes.
IMPRESSION: No acute abnormality.  Stable changes of COPD.

## 2015-11-23 ENCOUNTER — Other Ambulatory Visit: Payer: Self-pay | Admitting: Cardiovascular Disease

## 2015-11-23 DIAGNOSIS — I6523 Occlusion and stenosis of bilateral carotid arteries: Secondary | ICD-10-CM

## 2015-11-28 DIAGNOSIS — H16223 Keratoconjunctivitis sicca, not specified as Sjogren's, bilateral: Secondary | ICD-10-CM | POA: Diagnosis not present

## 2015-12-01 ENCOUNTER — Encounter (HOSPITAL_COMMUNITY): Payer: PPO

## 2015-12-15 ENCOUNTER — Ambulatory Visit (HOSPITAL_COMMUNITY)
Admission: RE | Admit: 2015-12-15 | Discharge: 2015-12-15 | Disposition: A | Discharge status: Home / Self Care | Payer: PPO | Source: Ambulatory Visit | Attending: Cardiology | Admitting: Cardiology

## 2015-12-15 DIAGNOSIS — Z87891 Personal history of nicotine dependence: Secondary | ICD-10-CM | POA: Diagnosis not present

## 2015-12-15 DIAGNOSIS — E785 Hyperlipidemia, unspecified: Secondary | ICD-10-CM | POA: Insufficient documentation

## 2015-12-15 DIAGNOSIS — I2510 Atherosclerotic heart disease of native coronary artery without angina pectoris: Secondary | ICD-10-CM | POA: Diagnosis not present

## 2015-12-15 DIAGNOSIS — I6523 Occlusion and stenosis of bilateral carotid arteries: Secondary | ICD-10-CM | POA: Insufficient documentation

## 2015-12-15 DIAGNOSIS — R938 Abnormal findings on diagnostic imaging of other specified body structures: Secondary | ICD-10-CM | POA: Insufficient documentation

## 2015-12-15 DIAGNOSIS — I10 Essential (primary) hypertension: Secondary | ICD-10-CM | POA: Insufficient documentation

## 2015-12-15 DIAGNOSIS — I739 Peripheral vascular disease, unspecified: Secondary | ICD-10-CM | POA: Diagnosis not present

## 2015-12-20 ENCOUNTER — Telehealth: Payer: Self-pay | Admitting: Cardiovascular Disease

## 2015-12-20 DIAGNOSIS — I739 Peripheral vascular disease, unspecified: Secondary | ICD-10-CM

## 2015-12-20 DIAGNOSIS — I779 Disorder of arteries and arterioles, unspecified: Secondary | ICD-10-CM

## 2015-12-20 NOTE — Telephone Encounter (Signed)
Results given to patient as follows. She verbalized understanding. Repeat orders entered.  Notes Recorded by Runell GessJonathan J Berry, MD on 12/20/2015 at 1:05 PM EST Patent right SFA stent with otherwise normal ABIs. Repeat 12 months  Notes Recorded by Runell GessJonathan J Berry, MD on 12/18/2015 at 4:59 PM EST Mild progression of RICA stenosis. Repeat 12 months

## 2015-12-20 NOTE — Telephone Encounter (Signed)
Pt returning call regarding test results-pl call

## 2015-12-20 NOTE — Telephone Encounter (Signed)
No answer. Left message to call back.   

## 2015-12-22 ENCOUNTER — Other Ambulatory Visit: Payer: Self-pay

## 2015-12-22 MED ORDER — CLOPIDOGREL BISULFATE 75 MG PO TABS: 75.0000 mg | ORAL_TABLET | Freq: Every day | ORAL | 0 refills | Status: DC

## 2015-12-23 ENCOUNTER — Telehealth: Payer: Self-pay | Admitting: Cardiovascular Disease

## 2015-12-23 NOTE — Telephone Encounter (Signed)
Closed encounter °

## 2015-12-29 ENCOUNTER — Encounter: Payer: Self-pay | Admitting: Cardiovascular Disease

## 2015-12-29 ENCOUNTER — Ambulatory Visit (INDEPENDENT_AMBULATORY_CARE_PROVIDER_SITE_OTHER): Payer: PPO | Admitting: Cardiovascular Disease

## 2015-12-29 VITALS — BP 100/60 | HR 80 | Ht 67.0 in | Wt 151.4 lb

## 2015-12-29 DIAGNOSIS — M542 Cervicalgia: Secondary | ICD-10-CM | POA: Diagnosis not present

## 2015-12-29 DIAGNOSIS — M549 Dorsalgia, unspecified: Secondary | ICD-10-CM

## 2015-12-29 DIAGNOSIS — I739 Peripheral vascular disease, unspecified: Secondary | ICD-10-CM | POA: Diagnosis not present

## 2015-12-29 DIAGNOSIS — I779 Disorder of arteries and arterioles, unspecified: Secondary | ICD-10-CM | POA: Diagnosis not present

## 2015-12-29 NOTE — Patient Instructions (Addendum)
Medication Instructions: Your physician recommends that you continue on your current medications as directed. Please refer to the Current Medication list given to you today.   Labwork: I will request lab work from your primary care provider.  Testing/Procedures: Your physician has requested that you have a lexiscan myoview. For further information please visit [email or web address].   Your physician has requested that you have a carotid duplex in 12 mionths. This test is an ultrasound of the carotid arteries in your neck. It looks at blood flow through these arteries that supply the brain with blood. Allow one hour for this exam. There are no restrictions or special instructions.  Your physician has requested that you have an ankle brachial index (ABI)12 months. During this test an ultrasound and blood pressure cuff are used to evaluate the arteries that supply the arms and legs with blood. Allow thirty minutes for this exam. There are no restrictions or special instructions.  Your physician has requested that you have a lower extremity arterial duplex 12 months. During this test, ultrasound is used to evaluate arterial blood flow in the legs. Allow one hour for this exam. There are no restrictions or special instructions.   Follow-Up: We request that you follow-up in: 6 months with an extender and in 12 months with Dr San MorelleBerry  You will receive a reminder letter in the mail two months in advance. If you don't receive a letter, please call our office to schedule the follow-up appointment.  If you need a refill on your cardiac medications before your next appointment, please call your pharmacy.

## 2015-12-29 NOTE — Assessment & Plan Note (Signed)
History of peripheral arterial disease status post right SFA stenting by myself in 2006. Because of "instant restenosis and a drop in her ABIs she had a Viabahn covered stent placed in 2013. Once again because of recurrent symptoms and abnormal Dopplers I restudied her 11/02/14 revealing a 95% proximal edge restenosis in her right SFA which I restudied with a 6 mm x 2.5 cm Viabahn covered stent. Her most recent lower extremity Dopplers performed 12/15/15 revealed a right ABI 0.91 with a widely patent stent and left ABI 1.1. She currently has no claudication and remains on dual antiplatelet therapy.

## 2015-12-29 NOTE — Assessment & Plan Note (Signed)
History of hyperlipidemia on statin therapy followed by her PCP. 

## 2015-12-29 NOTE — Assessment & Plan Note (Signed)
Recent Dopplers performed 12/15/15 revealed moderate right and mild left ICA stenosis. We will continue to follow this on an annual basis.

## 2015-12-29 NOTE — Progress Notes (Signed)
12/29/2015 [facility]   06/16/1940  [id number]  Primary Physician Neldon LabellaMILLER,LISA LYNN, MD Primary Cardiologist: Runell GessJonathan J Yolonda Purtle MD Roseanne RenoFACP, FACC, FAHA, FSCAI  HPI:  The patient is a delightful, 75 year old, thin-appearing, married Caucasian female, mother of 2, grandmother to 3 grandchildren who I last saw in the office 10/13/14. She has a history of CAD status post LAD stenting by myself in 2006, as well as right SFA stenting several months thereafter. She just stopped smoking in July of last year. I catheterized her August 05, 2009, revealing a patent LAD stent and a chronically occluded OM branch with high-grade mid RCA lesion which I stented with a Taxus Ion drug-eluting stent. At that time, I demonstrated a small abdominal aortic aneurysm measuring 2.3 x 2.4 cm. In addition, she developed recurrent lower extremity pain with Dopplers that showed a decrease in the right ABI from 0.99 to 0.75 with a high frequency signal in her mid right SFA. I angiogram'd her Jul 05, 2011, revealing proximal right SFA lesion along with in-stent restenosis. I revascularized her percutaneously which resulted in marked improvement in her ability to ambulate and in her Dopplers. [facility] and [facility] by Dr. Ellis Parentsruluck radially who placed 2 drug-eluting stents in the right coronary artery.  Because of recurrent claudication and abnormal Doppler studies I re-angiogramed to her 05/14/13 revealing high-grade disease in the mid right SFA between 2 previously placed stents. I restarted her with a 6 mm x 100 mm long Viabahn covered stent with excellent angiographic and clinical result. Her follow-up lower extremity arterial Doppler studies performed 12/04/13 revealed ABIs of 1 bilaterally with a widely patent stent. Since I saw her last she is complaining of some right lateral thigh pain and tingling in her right foot as well as dysesthesia in  both upper extremities. She does have a palpable pedal pulse on the right side. I performed a lower extremity ultrasound on her 09/27/14 revealing a high-frequency signal in the mid right SFA with a decline in her right ABI from 1.0 -.79. I angiogramed her 11/04/14 revealing a 95% stenosis of the leading edge of the previously placed covered stent which I restented using a 6 mm x 25 mm long Viabahn covered stent. She will also has claudication of her recent Dopplers revealed a right ABI 0.991 and a left of 1.1 with a widely patent SFA stent performed 12/15/15. She does have new symptoms of neck right shoulder and arm pain as well as hand tingling. She's never had chest pain associated with her coronary artery disease.   Current Outpatient Prescriptions  Medication Sig Dispense Refill  . aspirin 81 MG chewable tablet Chew 1 tablet (81 mg total) by mouth daily.    Marland Kitchen. atorvastatin (LIPITOR) 40 MG tablet Take 1 tablet (40 mg total) by mouth daily. Please schedule appointment for refills. 90 tablet 1  . clopidogrel (PLAVIX) 75 MG tablet Take 1 tablet (75 mg total) by mouth daily. PLEASE MAKE APPOINTMENT FOR FURTHER REFILLS 90 tablet 0  . estradiol (CLIMARA - DOSED IN MG/24 HR) 0.05 mg/24hr Place 1 patch onto the skin once a week.    . losartan (COZAAR) 100 MG tablet Take 1 tablet (100 mg total) by mouth daily. 30 tablet 0  . Omega-3 Fatty Acids (FISH OIL) 1200 MG CAPS Take 1,200 mg by mouth daily.     No current facility-administered medications for  this visit.     No Known Allergies  Social History   Social History  . Marital status: Married    Spouse name: N/A  . Number of children: N/A  . Years of education: N/A   Occupational History  . Not on file.   Social History Main Topics  . Smoking status: Former Smoker    Packs/day: 1.00    Years: 50.00    Types: Cigarettes    Quit date: 07/14/2010  . Smokeless tobacco: Never Used  . Alcohol use 3.6 oz/week    6 Glasses of wine per week      Comment: 05/14/2013  "socially; 2-3 glasses of wine twice/wk"  . Drug use: No  . Sexual activity: Not Currently   Other Topics Concern  . Not on file   Social History Narrative  . No narrative on file     Review of Systems: General: negative for chills, fever, night sweats or weight changes.  Cardiovascular: negative for chest pain, dyspnea on exertion, edema, orthopnea, palpitations, paroxysmal nocturnal dyspnea or shortness of breath Dermatological: negative for rash Respiratory: negative for cough or wheezing Urologic: negative for hematuria Abdominal: negative for nausea, vomiting, diarrhea, bright red blood per rectum, melena, or hematemesis Neurologic: negative for visual changes, syncope, or dizziness All other systems reviewed and are otherwise negative except as noted above.    Blood pressure 100/60, pulse 80, height 5\' 7"  (1.702 m), weight 151 lb 6.4 oz (68.7 kg), SpO2 97 %.  General appearance: alert and no distress Neck: no adenopathy, no carotid bruit, no JVD, supple, symmetrical, trachea midline and thyroid not enlarged, symmetric, no tenderness/mass/nodules Lungs: clear to auscultation bilaterally Heart: regular rate and rhythm, S1, S2 normal, no murmur, click, rub or gallop Extremities: extremities normal, atraumatic, no cyanosis or edema  EKG normal sinus rhythm at 80 without ST or T-wave changes. There was an isolated PVC noted. I personally reviewed this EKG  ASSESSMENT AND PLAN:   CAD- LAD stent in 2006, chronically occluded OM branch with hx. of Stent to RCA  History of CAD status post LAD stenting by myself in 2006. She had 2 drug-eluting stents placed in her right coronary artery by Dr. York Grice at [facility] July 2015. Over the last several months she's noticed some discomfort in her neck, right shoulder and back as well as tingling in her hands. She's never had chest pain related to her coronary disease. I'm going. Neurologic Myoview stress test to  rule out an ischemic etiology  Hyperlipidemia with target LDL less than 70 History of hyperlipidemia on statin therapy followed by her PCP  Essential hypertension History of hypertension blood pressure measures at 100/60. She is on losartan. Continue current meds at current dosing  PAD Rt SFA stent 2006 with ISR 5/13. New RSFA stent 05/14/13 History of peripheral arterial disease status post right SFA stenting by myself in 2006. Because of "instant restenosis and a drop in her ABIs she had a Viabahn covered stent placed in 2013. Once again because of recurrent symptoms and abnormal Dopplers I restudied her 11/02/14 revealing a 95% proximal edge restenosis in her right SFA which I restudied with a 6 mm x 2.5 cm Viabahn covered stent. Her most recent lower extremity Dopplers performed 12/15/15 revealed a right ABI 0.91 with a widely patent stent and left ABI 1.1. She currently has no claudication and remains on dual antiplatelet therapy.  Carotid artery disease (HCC) Recent Dopplers performed 12/15/15 revealed moderate right and mild left ICA  stenosis. We will continue to follow this on an annual basis.      Runell GessJonathan J. Nykia Turko MD FACP,FACC,FAHA, [facility] 12/29/2015 10:54 AM

## 2015-12-29 NOTE — Assessment & Plan Note (Signed)
History of CAD status post LAD stenting by myself in 2006. She had 2 drug-eluting stents placed in her right coronary artery by [facility] July 2015. Over the last several months she's noticed some discomfort in her neck, right shoulder and back as well as tingling in her hands. She's never had chest pain related to her coronary disease. I'm going. Neurologic Myoview stress test to rule out an ischemic etiology

## 2015-12-29 NOTE — Assessment & Plan Note (Signed)
History of hypertension blood pressure measures at 100/60. She is on losartan. Continue current meds at current dosing

## 2016-01-03 ENCOUNTER — Telehealth (HOSPITAL_COMMUNITY): Payer: Self-pay

## 2016-01-03 NOTE — Telephone Encounter (Signed)
Encounter complete. 

## 2016-01-10 ENCOUNTER — Ambulatory Visit (HOSPITAL_COMMUNITY)
Admission: RE | Admit: 2016-01-10 | Discharge: 2016-01-10 | Disposition: A | Discharge status: Home / Self Care | Payer: PPO | Source: Ambulatory Visit | Attending: Cardiovascular Disease | Admitting: Cardiovascular Disease

## 2016-01-10 DIAGNOSIS — M542 Cervicalgia: Secondary | ICD-10-CM

## 2016-01-10 DIAGNOSIS — M79601 Pain in right arm: Secondary | ICD-10-CM | POA: Insufficient documentation

## 2016-01-10 DIAGNOSIS — Z8249 Family history of ischemic heart disease and other diseases of the circulatory system: Secondary | ICD-10-CM | POA: Insufficient documentation

## 2016-01-10 DIAGNOSIS — Z87891 Personal history of nicotine dependence: Secondary | ICD-10-CM | POA: Diagnosis not present

## 2016-01-10 DIAGNOSIS — I779 Disorder of arteries and arterioles, unspecified: Secondary | ICD-10-CM | POA: Diagnosis not present

## 2016-01-10 DIAGNOSIS — M549 Dorsalgia, unspecified: Secondary | ICD-10-CM | POA: Diagnosis not present

## 2016-01-10 DIAGNOSIS — I739 Peripheral vascular disease, unspecified: Secondary | ICD-10-CM | POA: Diagnosis not present

## 2016-01-10 DIAGNOSIS — M25511 Pain in right shoulder: Secondary | ICD-10-CM | POA: Insufficient documentation

## 2016-01-10 DIAGNOSIS — I10 Essential (primary) hypertension: Secondary | ICD-10-CM | POA: Insufficient documentation

## 2016-01-10 DIAGNOSIS — R079 Chest pain, unspecified: Secondary | ICD-10-CM | POA: Insufficient documentation

## 2016-01-10 DIAGNOSIS — R5383 Other fatigue: Secondary | ICD-10-CM | POA: Insufficient documentation

## 2016-01-10 DIAGNOSIS — M545 Low back pain: Secondary | ICD-10-CM | POA: Diagnosis not present

## 2016-01-10 LAB — MYOCARDIAL PERFUSION IMAGING
CHL CUP NUCLEAR SRS: 5
CHL CUP NUCLEAR SSS: 12
CSEPPHR: 112 {beats}/min
LV dias vol: 93 mL (ref 46–106)
LVSYSVOL: 33 mL
Rest HR: 63 {beats}/min
SDS: 7
TID: 1.12

## 2016-01-10 MED ORDER — TECHNETIUM TC 99M TETROFOSMIN IV KIT
10.1000 | PACK | Freq: Once | INTRAVENOUS | Status: AC | PRN
  Administered 2016-01-10: 10.1 via INTRAVENOUS
  Filled 2016-01-10: qty 11

## 2016-01-10 MED ORDER — REGADENOSON 0.4 MG/5ML IV SOLN
0.4000 mg | Freq: Once | INTRAVENOUS | Status: AC
  Administered 2016-01-10: 0.4 mg via INTRAVENOUS

## 2016-01-10 MED ORDER — TECHNETIUM TC 99M TETROFOSMIN IV KIT
29.3000 | PACK | Freq: Once | INTRAVENOUS | Status: AC | PRN
  Administered 2016-01-10: 29.3 via INTRAVENOUS
  Filled 2016-01-10: qty 30

## 2016-02-01 DIAGNOSIS — M542 Cervicalgia: Secondary | ICD-10-CM | POA: Diagnosis not present

## 2016-02-01 DIAGNOSIS — M25511 Pain in right shoulder: Secondary | ICD-10-CM | POA: Diagnosis not present

## 2016-02-21 DIAGNOSIS — N898 Other specified noninflammatory disorders of vagina: Secondary | ICD-10-CM | POA: Diagnosis not present

## 2016-03-08 ENCOUNTER — Other Ambulatory Visit: Payer: Self-pay | Admitting: Family Medicine

## 2016-03-08 DIAGNOSIS — Z1231 Encounter for screening mammogram for malignant neoplasm of breast: Secondary | ICD-10-CM

## 2016-03-21 ENCOUNTER — Other Ambulatory Visit: Payer: Self-pay | Admitting: Cardiovascular Disease

## 2016-04-09 ENCOUNTER — Telehealth: Payer: Self-pay | Admitting: Cardiovascular Disease

## 2016-04-09 DIAGNOSIS — M7541 Impingement syndrome of right shoulder: Secondary | ICD-10-CM | POA: Diagnosis not present

## 2016-04-09 DIAGNOSIS — M542 Cervicalgia: Secondary | ICD-10-CM | POA: Diagnosis not present

## 2016-04-09 NOTE — Telephone Encounter (Signed)
Right shoulder MRI for rotator cuff tear -vs- impingement.

## 2016-04-09 NOTE — Telephone Encounter (Signed)
Dr Darrelyn HillockGioffre wants to know if pt can have a right shoulder MRI? He needs to know this since patient have stents.Please fax this to [phone].

## 2016-04-11 DIAGNOSIS — Z86010 Personal history of colonic polyps: Secondary | ICD-10-CM | POA: Diagnosis not present

## 2016-04-12 ENCOUNTER — Ambulatory Visit
Admission: RE | Admit: 2016-04-12 | Discharge: 2016-04-12 | Disposition: A | Discharge status: Home / Self Care | Payer: PPO | Source: Ambulatory Visit | Attending: Family Medicine | Admitting: Family Medicine

## 2016-04-12 DIAGNOSIS — Z1231 Encounter for screening mammogram for malignant neoplasm of breast: Secondary | ICD-10-CM | POA: Diagnosis not present

## 2016-04-13 ENCOUNTER — Telehealth: Payer: Self-pay | Admitting: Cardiovascular Disease

## 2016-04-13 NOTE — Telephone Encounter (Signed)
Requesting surgical clearance:  1. Type of surgery: Colonoscopy  2. Surgeon: not specified  3.Surgical Date:05/08/16  4. Medications that need to be held: Plavix and ASA 81--7 days prior   5. CAD: Yes  6. I will defer to:  Dr. Conception OmsBerry    Contact Information:  [facility] Phone:  [phone] Fax:  [phone]

## 2016-04-14 DIAGNOSIS — M7541 Impingement syndrome of right shoulder: Secondary | ICD-10-CM | POA: Diagnosis not present

## 2016-04-14 NOTE — Telephone Encounter (Signed)
OK to interrupt anti platelet RX for colonsocpoy

## 2016-04-14 NOTE — Telephone Encounter (Signed)
Not a prob to have an MRI of her shoulder

## 2016-04-16 NOTE — Telephone Encounter (Signed)
Clearance routed to number provided via EPIC. 

## 2016-04-16 NOTE — Telephone Encounter (Signed)
Sent (faxed)via EPIC

## 2016-04-19 DIAGNOSIS — M7541 Impingement syndrome of right shoulder: Secondary | ICD-10-CM | POA: Diagnosis not present

## 2016-05-08 DIAGNOSIS — Z86010 Personal history of colonic polyps: Secondary | ICD-10-CM | POA: Diagnosis not present

## 2016-06-05 DIAGNOSIS — K219 Gastro-esophageal reflux disease without esophagitis: Secondary | ICD-10-CM | POA: Diagnosis not present

## 2016-06-05 DIAGNOSIS — K2970 Gastritis, unspecified, without bleeding: Secondary | ICD-10-CM | POA: Diagnosis not present

## 2016-06-06 ENCOUNTER — Telehealth: Payer: Self-pay | Admitting: Cardiovascular Disease

## 2016-06-06 ENCOUNTER — Encounter (HOSPITAL_COMMUNITY): Payer: Self-pay

## 2016-06-06 ENCOUNTER — Inpatient Hospital Stay (HOSPITAL_COMMUNITY)
Admission: EM | Admit: 2016-06-06 | Discharge: 2016-06-07 | DRG: 378 | Disposition: A | Discharge status: Home / Self Care | Payer: PPO | Attending: Internal Medicine | Admitting: Internal Medicine

## 2016-06-06 DIAGNOSIS — K449 Diaphragmatic hernia without obstruction or gangrene: Secondary | ICD-10-CM | POA: Diagnosis not present

## 2016-06-06 DIAGNOSIS — Z955 Presence of coronary angioplasty implant and graft: Secondary | ICD-10-CM | POA: Diagnosis not present

## 2016-06-06 DIAGNOSIS — K219 Gastro-esophageal reflux disease without esophagitis: Secondary | ICD-10-CM | POA: Diagnosis not present

## 2016-06-06 DIAGNOSIS — E785 Hyperlipidemia, unspecified: Secondary | ICD-10-CM | POA: Diagnosis present

## 2016-06-06 DIAGNOSIS — K254 Chronic or unspecified gastric ulcer with hemorrhage: Secondary | ICD-10-CM | POA: Diagnosis not present

## 2016-06-06 DIAGNOSIS — D62 Acute posthemorrhagic anemia: Secondary | ICD-10-CM | POA: Diagnosis not present

## 2016-06-06 DIAGNOSIS — D649 Anemia, unspecified: Secondary | ICD-10-CM | POA: Diagnosis not present

## 2016-06-06 DIAGNOSIS — I739 Peripheral vascular disease, unspecified: Secondary | ICD-10-CM | POA: Diagnosis present

## 2016-06-06 DIAGNOSIS — K921 Melena: Secondary | ICD-10-CM | POA: Diagnosis not present

## 2016-06-06 DIAGNOSIS — I714 Abdominal aortic aneurysm, without rupture: Secondary | ICD-10-CM | POA: Diagnosis present

## 2016-06-06 DIAGNOSIS — I2510 Atherosclerotic heart disease of native coronary artery without angina pectoris: Secondary | ICD-10-CM | POA: Diagnosis not present

## 2016-06-06 DIAGNOSIS — K5900 Constipation, unspecified: Secondary | ICD-10-CM | POA: Diagnosis present

## 2016-06-06 DIAGNOSIS — Z95820 Peripheral vascular angioplasty status with implants and grafts: Secondary | ICD-10-CM

## 2016-06-06 DIAGNOSIS — Z87891 Personal history of nicotine dependence: Secondary | ICD-10-CM

## 2016-06-06 DIAGNOSIS — K222 Esophageal obstruction: Secondary | ICD-10-CM | POA: Diagnosis present

## 2016-06-06 DIAGNOSIS — R531 Weakness: Secondary | ICD-10-CM | POA: Diagnosis not present

## 2016-06-06 DIAGNOSIS — K922 Gastrointestinal hemorrhage, unspecified: Secondary | ICD-10-CM | POA: Diagnosis present

## 2016-06-06 DIAGNOSIS — I10 Essential (primary) hypertension: Secondary | ICD-10-CM | POA: Diagnosis not present

## 2016-06-06 DIAGNOSIS — Z79899 Other long term (current) drug therapy: Secondary | ICD-10-CM

## 2016-06-06 LAB — CBC WITH DIFFERENTIAL/PLATELET
BASOS PCT: 1 %
Basophils Absolute: 0.1 10*3/uL (ref 0.0–0.1)
Eosinophils Absolute: 0.1 10*3/uL (ref 0.0–0.7)
Eosinophils Relative: 1 %
HEMATOCRIT: 24.6 % — AB (ref 36.0–46.0)
Hemoglobin: 7.8 g/dL — ABNORMAL LOW (ref 12.0–15.0)
Lymphocytes Relative: 25 %
Lymphs Abs: 2.3 10*3/uL (ref 0.7–4.0)
MCH: 26.4 pg (ref 26.0–34.0)
MCHC: 31.7 g/dL (ref 30.0–36.0)
MCV: 83.1 fL (ref 78.0–100.0)
MONO ABS: 0.5 10*3/uL (ref 0.1–1.0)
MONOS PCT: 6 %
NEUTROS ABS: 6.2 10*3/uL (ref 1.7–7.7)
NEUTROS PCT: 67 %
Platelets: 345 10*3/uL (ref 150–400)
RBC: 2.96 MIL/uL — ABNORMAL LOW (ref 3.87–5.11)
RDW: 18.3 % — AB (ref 11.5–15.5)
WBC: 9.2 10*3/uL (ref 4.0–10.5)

## 2016-06-06 LAB — COMPREHENSIVE METABOLIC PANEL
ALT: 14 U/L (ref 14–54)
AST: 20 U/L (ref 15–41)
Albumin: 4 g/dL (ref 3.5–5.0)
Alkaline Phosphatase: 74 U/L (ref 38–126)
Anion gap: 8 (ref 5–15)
BUN: 18 mg/dL (ref 6–20)
CHLORIDE: 104 mmol/L (ref 101–111)
CO2: 24 mmol/L (ref 22–32)
CREATININE: 0.89 mg/dL (ref 0.44–1.00)
Calcium: 9.3 mg/dL (ref 8.9–10.3)
GFR calc non Af Amer: >60 mL/min (ref >60)
Glucose, Bld: 116 mg/dL — ABNORMAL HIGH (ref 65–99)
Potassium: 4.4 mmol/L (ref 3.5–5.1)
SODIUM: 136 mmol/L (ref 135–145)
Total Bilirubin: 0.3 mg/dL (ref 0.3–1.2)
Total Protein: 7.4 g/dL (ref 6.5–8.1)

## 2016-06-06 LAB — PREPARE RBC (CROSSMATCH)

## 2016-06-06 LAB — PROTIME-INR
INR: 0.97
Prothrombin Time: 12.9 seconds (ref 11.4–15.2)

## 2016-06-06 LAB — ABO/RH: ABO/RH(D): B POS

## 2016-06-06 MED ORDER — ONDANSETRON HCL 4 MG/2ML IJ SOLN: 4.0000 mg | Freq: Four times a day (QID) | INTRAMUSCULAR | Status: DC | PRN

## 2016-06-06 MED ORDER — ACETAMINOPHEN 325 MG PO TABS: 650.0000 mg | ORAL_TABLET | Freq: Four times a day (QID) | ORAL | Status: DC | PRN

## 2016-06-06 MED ORDER — PANTOPRAZOLE SODIUM 40 MG IV SOLR
40.0000 mg | Freq: Once | INTRAVENOUS | Status: AC
  Administered 2016-06-06: 40 mg via INTRAVENOUS
  Filled 2016-06-06: qty 40

## 2016-06-06 MED ORDER — ONDANSETRON HCL 4 MG PO TABS: 4.0000 mg | ORAL_TABLET | Freq: Four times a day (QID) | ORAL | Status: DC | PRN

## 2016-06-06 MED ORDER — LOSARTAN POTASSIUM 50 MG PO TABS: 100.0000 mg | ORAL_TABLET | Freq: Every day | ORAL | Status: DC

## 2016-06-06 MED ORDER — POLYETHYLENE GLYCOL 3350 17 G PO PACK: 17.0000 g | PACK | Freq: Every day | ORAL | Status: DC | PRN

## 2016-06-06 MED ORDER — SODIUM CHLORIDE 0.9 % IV SOLN: Freq: Once | INTRAVENOUS | Status: DC

## 2016-06-06 MED ORDER — LACTATED RINGERS IV SOLN
INTRAVENOUS | Status: DC
  Administered 2016-06-06 – 2016-06-07 (×2): via INTRAVENOUS

## 2016-06-06 MED ORDER — SODIUM CHLORIDE 0.9 % IV BOLUS (SEPSIS)
1000.0000 mL | Freq: Once | INTRAVENOUS | Status: AC
  Administered 2016-06-06: 1000 mL via INTRAVENOUS

## 2016-06-06 MED ORDER — PANTOPRAZOLE SODIUM 40 MG IV SOLR
40.0000 mg | Freq: Two times a day (BID) | INTRAVENOUS | Status: DC
  Administered 2016-06-06: 40 mg via INTRAVENOUS
  Filled 2016-06-06: qty 40

## 2016-06-06 MED ORDER — ATORVASTATIN CALCIUM 40 MG PO TABS: 40.0000 mg | ORAL_TABLET | Freq: Every day | ORAL | Status: DC

## 2016-06-06 MED ORDER — ACETAMINOPHEN 650 MG RE SUPP
650.0000 mg | Freq: Four times a day (QID) | RECTAL | Status: DC | PRN
Start: 2016-06-06 — End: 2016-06-07

## 2016-06-06 MED ORDER — FAMOTIDINE IN NACL 20-0.9 MG/50ML-% IV SOLN
20.0000 mg | Freq: Once | INTRAVENOUS | Status: AC
  Administered 2016-06-06: 20 mg via INTRAVENOUS
  Filled 2016-06-06: qty 50

## 2016-06-06 NOTE — ED Notes (Signed)
Will give bedside report to [facility] for assigned room 1430.

## 2016-06-06 NOTE — ED Triage Notes (Signed)
Dr. Luciana Axe saw her at her office [facility] al) for c/o epigastric discomfort. Dr. Luciana Axe noted her hemoglobin today to be in the upper 7's; and historically it has been as high as 13. She also mentions pt. Is on Plavix; and has a positive Hemoccult today.

## 2016-06-06 NOTE — ED Triage Notes (Signed)
Pt states she was feeling weak yesterday and tired.  Epigastric burning with leg pain.  Pt went to md and had labs.  Told hgb 8 then dropped to 7's.  Told to come to ED.  Hemoccult positive at MD office.

## 2016-06-06 NOTE — Telephone Encounter (Signed)
New message      Pt c/o that her legs are very weak. She has a stent in her leg. Please advise

## 2016-06-06 NOTE — H&P (Addendum)
History and Physical    Zyrah Wiswell [facility] WJX:[id number] DOB: 30-Nov-1940  DOA: 06/06/2016 PCP: Neldon Labella, MD  Patient coming from: Home  Chief Complaint: Generalized weakness and shortness of breath  HPI: NELLY SCRIVEN is a 76 y.o. female with medical history significant of CAD with stent x 4, hypertension, PAD with stent 3 on aspirin and Plavix, AAA, hyperlipidemia, presented from her PCP office after lab workup showed hemoglobin of 8. At her PCP FOBT was done which was positive. Patient reported that she has been weak and have acutely become short of breath and more fatigued for the past week. She reported about 2-3 days ago noticed dark stools. Associated symptoms include heartburn, she PCP is being treated with Protonix. Denies any pain medication. She reported that she drinks socially. Patient denies chest pain, palpitation,dizziness, nausea, vomiting and abdominal pain    ED Course: Hemoglobin 7.8, normal INR, BMP unremarkable. GI team was consulted who came and evaluated plans to do EGD in the morning.  Review of Systems:   General: See HPI  HEENT: Use glasses , hearing changes or sore throat Respiratory: + SOB, no coughing or wheezing CV: no chest pain, no palpitations GI: no nausea, vomiting, abdominal pain, diarrhea, constipation GU: no dysuria, burning on urination, increased urinary frequency, hematuria  Ext:. No deformities,  Neuro: no unilateral weakness, numbness, or tingling, no vision change or hearing loss Skin: No rashes, lesions or wounds. MSK: No muscle spasm, no deformity, no limitation of range of movement in spin Heme: No easy bruising.  Travel history: No recent long distant travel.   Past Medical History:  Diagnosis Date  . AAA (abdominal aortic aneurysm), small, history of 2.3 X 2.4 07/06/2011  . Abnormal ankle brachial index 07/06/2011  . Anxiety   . CAD (coronary artery disease), with LAD stent in 2006, chronically occluded OM branch with  hx. of Stent to RCA  07/06/2011  . Claudication in peripheral vascular disease, life style limiting 07/06/2011  . Hyperlipidemia LDL goal < 70 09/08/2012  . Hypertension   . PAD (peripheral artery disease) (HCC)     Past Surgical History:  Procedure Laterality Date  . ABDOMINAL AORTAGRAM N/A 07/05/2011   Procedure: ABDOMINAL Ronny Flurry;  Surgeon: Runell Gess, MD;  Location: [facility] CATH LAB;  Service: Cardiovascular;  Laterality: N/A;  . ABDOMINAL HYSTERECTOMY  1992  . CATARACT EXTRACTION W/ INTRAOCULAR LENS  IMPLANT, BILATERAL Bilateral ~ 2006  . CORONARY ANGIOPLASTY WITH STENT PLACEMENT  2006; 2009; 2013   "1 + 1 + 2";  total of 4"  . FEMORAL ARTERY STENT Right 2006; 07/05/2011; 05/14/2013  . FEMORAL ARTERY STENT Right 05/14/13   between previous stens  . LOWER EXTREMITY ANGIOGRAM N/A 07/05/2011   Procedure: LOWER EXTREMITY ANGIOGRAM;  Surgeon: Runell Gess, MD;  Location: [facility] CATH LAB;  Service: Cardiovascular;  Laterality: N/A;  . LOWER EXTREMITY ANGIOGRAM N/A 05/14/2013   Procedure: LOWER EXTREMITY ANGIOGRAM;  Surgeon: Runell Gess, MD;  Location: [facility] CATH LAB;  Service: Cardiovascular;  Laterality: N/A;  . LUMBAR DISC SURGERY  1980;; 1982  . PERCUTANEOUS STENT INTERVENTION Right 07/05/2011   Procedure: PERCUTANEOUS STENT INTERVENTION;  Surgeon: Runell Gess, MD;  Location: [facility] CATH LAB;  Service: Cardiovascular;  Laterality: Right;  . PERIPHERAL VASCULAR CATHETERIZATION N/A 11/04/2014   Procedure: Lower Extremity Angiography;  Surgeon: Runell Gess, MD;  Location: [facility] INVASIVE CV LAB;  Service: Cardiovascular;  Laterality: N/A;  . PERIPHERAL VASCULAR CATHETERIZATION Right 11/04/2014   Procedure: Peripheral Vascular  Intervention;  Surgeon: Runell Gess, MD;  Location: [facility] INVASIVE CV LAB;  Service: Cardiovascular;  Laterality: Right;  SFA  . SHOULDER OPEN ROTATOR CUFF REPAIR Left ~ 2004     reports that she quit smoking about 5 years ago. Her smoking use included Cigarettes. She  has a 50.00 pack-year smoking history. She has never used smokeless tobacco. She reports that she drinks about 3.6 oz of alcohol per week . She reports that she does not use drugs.  No Known Allergies  Family History  Problem Relation Age of Onset  . Cancer Mother 47  . Heart attack Father     died 73  . Parkinson's disease Sister   . Heart attack Maternal Grandmother   . Heart attack Maternal Grandfather 50    died at 32  . Stroke Paternal Grandfather   . Heart disease Sister   . Deep vein thrombosis Daughter    Family history reviewed and non-pertinent  Prior to Admission medications   Medication Sig Start Date End Date Taking? Authorizing Provider  aspirin EC 81 MG tablet Take 81 mg by mouth daily.   Yes Historical Provider, MD  atorvastatin (LIPITOR) 40 MG tablet Take 1 tablet (40 mg total) by mouth daily. Please schedule appointment for refills. 10/28/15  Yes Runell Gess, MD  clopidogrel (PLAVIX) 75 MG tablet Take 1 tablet (75 mg total) by mouth daily. 03/21/16  Yes Runell Gess, MD  losartan (COZAAR) 100 MG tablet Take 1 tablet (100 mg total) by mouth daily. 05/06/15  Yes Runell Gess, MD  Omega-3 Fatty Acids (FISH OIL) 1200 MG CAPS Take 1,200 mg by mouth daily.   Yes Historical Provider, MD  pantoprazole (PROTONIX) 20 MG tablet Take 20 mg by mouth 2 (two) times daily.   Yes Historical Provider, MD    Physical Exam: Vitals:   06/06/16 1244 06/06/16 1552 06/06/16 1651  BP: 121/65 125/63 (!) 116/50  Pulse: (!) 105 63 79  Resp: Temp: 98.1 F (36.7 C)  97.6 F (36.4 C)  TempSrc: Oral  Oral  SpO2: 100% 99% 99%  Weight: 67.6 kg (149 lb)    Height:  (1.702 m)      Constitutional: NAD, calm, comfortable Eyes: PERRL,  pale conjunctivae  ENMT: Mucous membranes are moist. Posterior pharynx clear of any exudate or lesions.  Neck: normal, supple, no masses, no thyromegaly Respiratory: clear to auscultation bilaterally, no wheezing, no crackles. Normal  respiratory effort.  Cardiovascular: Regular rate and rhythm, no murmurs / rubs / gallops. No extremity edema. 2+ pedal pulses. Abdomen: no tenderness, no masses palpated. No hepatosplenomegaly. Bowel sounds positive.  Musculoskeletal: no clubbing / cyanosis. No joint deformity upper and lower extremities.  Skin: no rashes, lesions, ulcers. No induration Neurologic: CN 2-12 grossly intact.  Psychiatric: Normal judgment and insight. Alert and oriented x 3.   Labs on Admission: I have personally reviewed following labs and imaging studies  CBC:  Recent Labs Lab 06/06/16 1325  WBC 9.2  NEUTROABS 6.2  HGB 7.8*  HCT 24.6*  MCV 83.1  PLT 345   Basic Metabolic Panel:  Recent Labs Lab 06/06/16 1325  NA 136  K 4.4  CL 104  CO2 24  GLUCOSE 116*  BUN 18  CREATININE 0.89  CALCIUM 9.3   GFR: Estimated Creatinine Clearance: 53.1 mL/min (by C-G formula based on SCr of 0.89 mg/dL). Liver Function Tests:  Recent Labs Lab 06/06/16 1325  AST 20  ALT 14  ALKPHOS 74  BILITOT 0.3  PROT 7.4  ALBUMIN 4.0   Coagulation Profile:  Recent Labs Lab 06/06/16 1325  INR 0.97    Radiological Exams on Admission: No results found.  EKG: Independently reviewed. Normal sinus rhythm no acute changes from previous  Assessment/Plan GI bleed - suspected upper GI source, given patient with history of acid reflux and use of aspirin and Plavix. Patient report black stools for the past week, FOBT positive on patient's PCP. Denies chronic use of pain medication. Admitted to telemetry PPI IV 40 mg twice a day Will start IV fluid with LR  Monitor for signs of fluid overload  Avoid NSAID's  GI consulted planning for endoscopy in the morning  Anemia of acute blood loss - per patient she runs around 13 of hemoglobin, today 7.8 Symptomatic anemia, we'll transfuse 1 unit of PRBCs Check CBC in the morning Anemia panel Will not repeat FOBT given per patient was done at her PCP no records  available  History of CAD - asymptomatic We'll hold aspirin and Plavix due to GI bleed  Hypertension - stable Continue home medications  DVT prophylaxis: SCDs Code Status: Full code  Family Communication: Husband at bedside  Disposition Plan: Anticipate discharge to previous home environment.  Consults called: GI consulted Admission status: Telemetry/inpatient  Latrelle Dodrill MD [facility] Pager: Text Page via [email or web address]  [phone]  If 7PM-7AM, please contact night-coverage [email or web address] Password [facility]  06/06/2016, 5:29 PM

## 2016-06-06 NOTE — Consult Note (Signed)
Referring Provider:  Dr. Zapata ([facility]) Primary Care Physician:  MILLER,LISA LYNN, MD Primary Gastroenterologist:  Dr. John Hayes  Reason for Consultation:  Heme positive dark stool and anemia  HPI: Sara Dominguez is a 75 y.o. female who is several weeks status post colonoscopy by Dr. Hayes, which was entirely normal, no polyps or vascular ectasia (has prior history of polyps).   More recently, she began to notice dark, but not tarry stools, associated with weakness and dyspepsia in recent days. At her primary physician's office yesterday, hemoglobin was 8.3, as compared to 13.12 years earlier.  She is on 81 mg aspirin daily for coronary and peripheral vascular arterial stents, and she also uses to ibuprofen most days because of shoulder pain, and has done so for 6 months.   No prior history of ulcer disease or GI bleeding or endoscopic evaluation.   The patient is a previous smoker, not currently. Social ethanol.   On presentation to the emergency room, hemoglobin was 7.8 with an MCV of 83. She is ordered already for 1 unit of packed red cells.   Past Medical History:  Diagnosis Date  . AAA (abdominal aortic aneurysm), small, history of 2.3 X 2.4 07/06/2011  . Abnormal ankle brachial index 07/06/2011  . Anxiety   . CAD (coronary artery disease), with LAD stent in 2006, chronically occluded OM branch with hx. of Stent to RCA  07/06/2011  . Claudication in peripheral vascular disease, life style limiting 07/06/2011  . Hyperlipidemia LDL goal < 70 09/08/2012  . Hypertension   . PAD (peripheral artery disease) (HCC)     Past Surgical History:  Procedure Laterality Date  . ABDOMINAL AORTAGRAM N/A 07/05/2011   Procedure: ABDOMINAL AORTAGRAM;  Surgeon: Jonathan J Berry, MD;  Location: [facility] CATH LAB;  Service: Cardiovascular;  Laterality: N/A;  . ABDOMINAL HYSTERECTOMY  1992  . CATARACT EXTRACTION W/ INTRAOCULAR LENS  IMPLANT, BILATERAL Bilateral ~ 2006  . CORONARY ANGIOPLASTY  WITH STENT PLACEMENT  2006; 2009; 2013   "1 + 1 + 2";  total of 4"  . FEMORAL ARTERY STENT Right 2006; 07/05/2011; 05/14/2013  . FEMORAL ARTERY STENT Right 05/14/13   between previous stens  . LOWER EXTREMITY ANGIOGRAM N/A 07/05/2011   Procedure: LOWER EXTREMITY ANGIOGRAM;  Surgeon: Jonathan J Berry, MD;  Location: [facility] CATH LAB;  Service: Cardiovascular;  Laterality: N/A;  . LOWER EXTREMITY ANGIOGRAM N/A 05/14/2013   Procedure: LOWER EXTREMITY ANGIOGRAM;  Surgeon: Jonathan J Berry, MD;  Location: [facility] CATH LAB;  Service: Cardiovascular;  Laterality: N/A;  . LUMBAR DISC SURGERY  1980;; 1982  . PERCUTANEOUS STENT INTERVENTION Right 07/05/2011   Procedure: PERCUTANEOUS STENT INTERVENTION;  Surgeon: Jonathan J Berry, MD;  Location: [facility] CATH LAB;  Service: Cardiovascular;  Laterality: Right;  . PERIPHERAL VASCULAR CATHETERIZATION N/A 11/04/2014   Procedure: Lower Extremity Angiography;  Surgeon: Jonathan J Berry, MD;  Location: [facility] INVASIVE CV LAB;  Service: Cardiovascular;  Laterality: N/A;  . PERIPHERAL VASCULAR CATHETERIZATION Right 11/04/2014   Procedure: Peripheral Vascular Intervention;  Surgeon: Jonathan J Berry, MD;  Location: [facility] INVASIVE CV LAB;  Service: Cardiovascular;  Laterality: Right;  SFA  . SHOULDER OPEN ROTATOR CUFF REPAIR Left ~ 2004    Prior to Admission medications   Medication Sig Start Date End Date Taking? Authorizing Provider  aspirin EC 81 MG tablet Take 81 mg by mouth daily.   Yes Historical Provider, MD  atorvastatin (LIPITOR) 40 MG tablet Take 1 tablet (40 mg total) by mouth daily. Please schedule   appointment for refills. 10/28/15  Yes Jonathan J Berry, MD  clopidogrel (PLAVIX) 75 MG tablet Take 1 tablet (75 mg total) by mouth daily. 03/21/16  Yes Jonathan J Berry, MD  losartan (COZAAR) 100 MG tablet Take 1 tablet (100 mg total) by mouth daily. 05/06/15  Yes Jonathan J Berry, MD  Omega-3 Fatty Acids (FISH OIL) 1200 MG CAPS Take 1,200 mg by mouth daily.   Yes Historical Provider, MD   pantoprazole (PROTONIX) 20 MG tablet Take 20 mg by mouth 2 (two) times daily.   Yes Historical Provider, MD    Current Facility-Administered Medications  Medication Dose Route Frequency Provider Last Rate Last Dose  . 0.9 %  sodium chloride infusion   Intravenous Once Christopher J Tegeler, MD       Current Outpatient Prescriptions  Medication Sig Dispense Refill  . aspirin EC 81 MG tablet Take 81 mg by mouth daily.    . atorvastatin (LIPITOR) 40 MG tablet Take 1 tablet (40 mg total) by mouth daily. Please schedule appointment for refills. 90 tablet 1  . clopidogrel (PLAVIX) 75 MG tablet Take 1 tablet (75 mg total) by mouth daily. 90 tablet 2  . losartan (COZAAR) 100 MG tablet Take 1 tablet (100 mg total) by mouth daily. 30 tablet 0  . Omega-3 Fatty Acids (FISH OIL) 1200 MG CAPS Take 1,200 mg by mouth daily.    . pantoprazole (PROTONIX) 20 MG tablet Take 20 mg by mouth 2 (two) times daily.      Allergies as of 06/06/2016  . (No Known Allergies)    Family History  Problem Relation Age of Onset  . Cancer Mother 55  . Heart attack Father     died 69  . Parkinson's disease Sister   . Heart attack Maternal Grandmother   . Heart attack Maternal Grandfather 50    died at 50  . Stroke Paternal Grandfather   . Heart disease Sister   . Deep vein thrombosis Daughter     Social History   Social History  . Marital status: Married    Spouse name: N/A  . Number of children: N/A  . Years of education: N/A   Occupational History  . Not on file.   Social History Main Topics  . Smoking status: Former Smoker    Packs/day: 1.00    Years: 50.00    Types: Cigarettes    Quit date: 07/14/2010  . Smokeless tobacco: Never Used  . Alcohol use 3.6 oz/week    6 Glasses of wine per week     Comment: 05/14/2013  "socially; 2-3 glasses of wine twice/wk"  . Drug use: No  . Sexual activity: Not Currently   Other Topics Concern  . Not on file   Social History Narrative  . No narrative on  file    Review of Systems: The patient does report some chest tightness with exertion recently. She also has had shoulder pain attributed to "bursitis" or "arthritis." Poor appetite recently, no weight loss. Rare dysphagia. No chronic abdominal pain or chronic dyspeptic symptoms. Not on PPI therapy. No shortness of breath. No peripheral edema. No joint swelling. Her voice has been somewhat more hoarse recently than at baseline.  Physical Exam: Vital signs in last 24 hours: Temp:  [98.1 F (36.7 C)] 98.1 F (36.7 C) (04/25 1244) Pulse Rate:  [63-105] 63 (04/25 1552) Resp:  [17-18] 18 (04/25 1552) BP: (121-125)/(63-65) 125/63 (04/25 1552) SpO2:  [99 %-100 %] 99 % (04/25 1552) Weight:  [67.6   kg (149 lb)] 67.6 kg (149 lb) (04/25 1244)   General:   Alert,  Well-developed, well-nourished, pleasant and cooperative in NAD Head:  Normocephalic and atraumatic. Eyes:  Sclera clear, no icterus.   Conjunctiva pink. Mouth:   No ulcerations or lesions.  Oropharynx pink & moist. Neck:   No masses or thyromegaly.  Voice is hoarse and deep, c/w prior smoking hx. Lungs:  Clear throughout to auscultation.   No wheezes, crackles, or rhonchi. No evident respiratory distress. Heart:   Regular rate and rhythm with occasional premature beat; no murmurs, clicks, rubs,  or gallops. Abdomen:  Soft, nontender, and nondistended. No masses, hepatosplenomegaly or ventral hernias noted.  Msk:   Symmetrical without gross deformities. Pulses:  Normal radial pulse is noted. Extremities:   Without clubbing, cyanosis, or edema. Neurologic:  Alert and coherent;  grossly normal neurologically. Skin:  Intact without significant lesions or rashes. Palms are pale. Cervical Nodes:  No significant cervical adenopathy. Psych:   Alert and cooperative. Normal mood and affect.  Intake/Output from previous day: No intake/output data recorded. Intake/Output this shift: No intake/output data recorded.  Lab Results:  Recent  Labs  06/06/16 1325  WBC 9.2  HGB 7.8*  HCT 24.6*  PLT 345   BMET  Recent Labs  06/06/16 1325  NA 136  K 4.4  CL 104  CO2 24  GLUCOSE 116*  BUN 18  CREATININE 0.89  CALCIUM 9.3   LFT  Recent Labs  06/06/16 1325  PROT 7.4  ALBUMIN 4.0  AST 20  ALT 14  ALKPHOS 74  BILITOT 0.3   PT/INR  Recent Labs  06/06/16 1325  LABPROT 12.9  INR 0.97    Studies/Results: No results found.  Impression: 1. Subacute GI blood loss, possibly superimposed on chronic GI blood loss (MCV 83) with heme positive stool and significant anemia, in a patient with significant aspirin and nonsteroidal anti-inflammatory drug exposure, also on Plavix which would magnify any degree of blood loss from ulcerogenic medications. 2. Dyspeptic symptomatology, recent onset  Plan: Endoscopic evaluation tomorrow to clarify the picture. Nature, purpose, risks reviewed and patient and husband at bedside agreeable. PPI therapy in the meantime.   LOS: 0 days   Cyris Maalouf V  06/06/2016, 4:03 PM   Pager [phone] If no answer or after 5 PM call [phone] 

## 2016-06-06 NOTE — Telephone Encounter (Signed)
Spoke with pt states that she has been having cramping and weakness in her Left-LE for the last few days happens about 3 times a day. She states that she also had stomach cramping, hot flashes, numbness in feet and coldness in feet(this is not new-feet stay cold) she states that she went to PCP yesterday and she said that it was just reflux and has started taking medicine. Her BP at PCP office was 120/60 and today is 134/69 and  has been running in the 130's.denies any sob or chest pain or pressure, redness/heat in LE. Pt states that she is taking medication as ordered. Pt is asking if there is anything she can do for this. Appt scheduled Monday with Brittainy simmons Monday. Please advise, anything to do until then?

## 2016-06-06 NOTE — ED Provider Notes (Signed)
[facility]-EMERGENCY DEPT Provider Note   CSN: [id number] Arrival date & time: 06/06/16  1236     History   Chief Complaint Chief Complaint  Patient presents with  . Abnormal Lab    HPI Sara Dominguez is a 76 y.o. female.  The history is provided by the patient and the spouse.  Rectal Bleeding  Quality:  Black and tarry Amount:  Moderate Duration:  1 week Timing:  Constant Chronicity:  New Context: constipation   Similar prior episodes: no   Relieved by:  Nothing Worsened by:  Nothing Ineffective treatments:  None tried Associated symptoms: abdominal pain and light-headedness   Associated symptoms: no fever, no loss of consciousness (presyncopal) and no vomiting   Abdominal pain:    Location:  Generalized   Quality: cramping     Severity:  Moderate   Onset quality:  Gradual   Duration:  1 week   Timing:  Intermittent   Progression:  Waxing and waning   Chronicity:  New Risk factors: anticoagulant use (ASA and plavix)     Past Medical History:  Diagnosis Date  . AAA (abdominal aortic aneurysm), small, history of 2.3 X 2.4 07/06/2011  . Abnormal ankle brachial index 07/06/2011  . Anxiety   . CAD (coronary artery disease), with LAD stent in 2006, chronically occluded OM branch with hx. of Stent to RCA  07/06/2011  . Claudication in peripheral vascular disease, life style limiting 07/06/2011  . Hyperlipidemia LDL goal < 70 09/08/2012  . Hypertension   . PAD (peripheral artery disease) [facility])     Patient Active Problem List   Diagnosis Date Noted  . Carotid artery disease (HCC) 12/29/2015  . Claudication (HCC) 05/14/2013  . Essential hypertension 04/28/2013  . Hyperlipidemia with target LDL less than 70 09/08/2012  . Claudication in peripheral vascular disease, life style limiting 07/06/2011  . Abnormal ankle brachial index 07/06/2011  . PAD Rt SFA stent 2006 with ISR 5/13. New RSFA stent 05/14/13 07/06/2011  . CAD- LAD stent in 2006, chronically occluded OM branch  with hx. of Stent to RCA  07/06/2011  . AAA (abdominal aortic aneurysm), small, history of 2.3 X 2.4 07/06/2011    Past Surgical History:  Procedure Laterality Date  . ABDOMINAL AORTAGRAM N/A 07/05/2011   Procedure: ABDOMINAL Ronny Flurry;  Surgeon: Runell Gess, MD;  Location: [facility] CATH LAB;  Service: Cardiovascular;  Laterality: N/A;  . ABDOMINAL HYSTERECTOMY  1992  . CATARACT EXTRACTION W/ INTRAOCULAR LENS  IMPLANT, BILATERAL Bilateral ~ 2006  . CORONARY ANGIOPLASTY WITH STENT PLACEMENT  2006; 2009; 2013   "1 + 1 + 2";  total of 4"  . FEMORAL ARTERY STENT Right 2006; 07/05/2011; 05/14/2013  . FEMORAL ARTERY STENT Right 05/14/13   between previous stens  . LOWER EXTREMITY ANGIOGRAM N/A 07/05/2011   Procedure: LOWER EXTREMITY ANGIOGRAM;  Surgeon: Runell Gess, MD;  Location: [facility] CATH LAB;  Service: Cardiovascular;  Laterality: N/A;  . LOWER EXTREMITY ANGIOGRAM N/A 05/14/2013   Procedure: LOWER EXTREMITY ANGIOGRAM;  Surgeon: Runell Gess, MD;  Location: [facility] CATH LAB;  Service: Cardiovascular;  Laterality: N/A;  . LUMBAR DISC SURGERY  1980;; 1982  . PERCUTANEOUS STENT INTERVENTION Right 07/05/2011   Procedure: PERCUTANEOUS STENT INTERVENTION;  Surgeon: Runell Gess, MD;  Location: [facility] CATH LAB;  Service: Cardiovascular;  Laterality: Right;  . PERIPHERAL VASCULAR CATHETERIZATION N/A 11/04/2014   Procedure: Lower Extremity Angiography;  Surgeon: Runell Gess, MD;  Location: [facility] INVASIVE CV LAB;  Service: Cardiovascular;  Laterality:  N/A;  . PERIPHERAL VASCULAR CATHETERIZATION Right 11/04/2014   Procedure: Peripheral Vascular Intervention;  Surgeon: Runell Gess, MD;  Location: [facility] INVASIVE CV LAB;  Service: Cardiovascular;  Laterality: Right;  SFA  . SHOULDER OPEN ROTATOR CUFF REPAIR Left ~ 2004    OB History    No data available       Home Medications    Prior to Admission medications   Medication Sig Start Date End Date Taking? Authorizing Provider  aspirin 81 MG chewable  tablet Chew 1 tablet (81 mg total) by mouth daily. 05/15/13   Abelino Derrick, PA-C  atorvastatin (LIPITOR) 40 MG tablet Take 1 tablet (40 mg total) by mouth daily. Please schedule appointment for refills. 10/28/15   Runell Gess, MD  clopidogrel (PLAVIX) 75 MG tablet Take 1 tablet (75 mg total) by mouth daily. 03/21/16   Runell Gess, MD  estradiol (CLIMARA - DOSED IN MG/24 HR) 0.05 mg/24hr Place 1 patch onto the skin once a week.    Historical Provider, MD  losartan (COZAAR) 100 MG tablet Take 1 tablet (100 mg total) by mouth daily. 05/06/15   Runell Gess, MD  Omega-3 Fatty Acids (FISH OIL) 1200 MG CAPS Take 1,200 mg by mouth daily.    Historical Provider, MD    Family History Family History  Problem Relation Age of Onset  . Cancer Mother 44  . Heart attack Father     died 80  . Parkinson's disease Sister   . Heart attack Maternal Grandmother   . Heart attack Maternal Grandfather 50    died at 53  . Stroke Paternal Grandfather   . Heart disease Sister   . Deep vein thrombosis Daughter     Social History Social History  Substance Use Topics  . Smoking status: Former Smoker    Packs/day: 1.00    Years: 50.00    Types: Cigarettes    Quit date: 07/14/2010  . Smokeless tobacco: Never Used  . Alcohol use 3.6 oz/week    6 Glasses of wine per week     Comment: 05/14/2013  "socially; 2-3 glasses of wine twice/wk"     Allergies   Patient has no known allergies.   Review of Systems Review of Systems  Constitutional: Positive for fatigue. Negative for diaphoresis and fever.  HENT: Negative for congestion and rhinorrhea.   Respiratory: Negative for cough, chest tightness, shortness of breath, wheezing and stridor.   Cardiovascular: Negative for chest pain and palpitations.  Gastrointestinal: Positive for abdominal pain, hematochezia and nausea. Negative for constipation, diarrhea and vomiting.  Genitourinary: Negative for dysuria and flank pain.  Musculoskeletal: Negative for  back pain, neck pain and neck stiffness.  Skin: Negative for rash and wound.  Neurological: Positive for weakness (generalized weakness) and light-headedness. Negative for loss of consciousness (presyncopal), syncope and headaches.  Psychiatric/Behavioral: Negative for agitation and confusion.  All other systems reviewed and are negative.    Physical Exam Updated Vital Signs BP 121/65 (BP Location: Left Arm)   Pulse (!) 105   Temp 98.1 F (36.7 C) (Oral)   Resp 17   Ht  (1.702 m)   Wt 149 lb (67.6 kg)   SpO2 100%   BMI 23.34 kg/m   Physical Exam  Constitutional: She is oriented to person, place, and time. She appears well-developed and well-nourished. No distress.  HENT:  Head: Normocephalic and atraumatic.  Right Ear: External ear normal.  Left Ear: External ear normal.  Nose: Nose normal.  Mouth/Throat: Oropharynx is clear and moist. No oropharyngeal exudate.  Eyes: Conjunctivae and EOM are normal. Pupils are equal, round, and reactive to light.  Neck: Normal range of motion. Neck supple.  Cardiovascular: Regular rhythm and intact distal pulses.  Tachycardia present.   No murmur heard. Pulmonary/Chest: No stridor. No respiratory distress. She has no wheezes. She exhibits no tenderness.  Abdominal: Soft. She exhibits no distension. There is no tenderness. There is no rebound.  Genitourinary: Guaiac stool: reported guiac positive at PCP today.  Musculoskeletal: She exhibits no tenderness.  Neurological: She is alert and oriented to person, place, and time. She has normal reflexes. No sensory deficit. She exhibits normal muscle tone.  Skin: Skin is warm. No rash noted. She is not diaphoretic. No erythema.  Psychiatric: She has a normal mood and affect.  Nursing note and vitals reviewed.    ED Treatments / Results  Labs (all labs ordered are listed, but only abnormal results are displayed) Labs Reviewed  COMPREHENSIVE METABOLIC PANEL - Abnormal; Notable for the  following:       Result Value   Glucose, Bld 116 (*)    All other components within normal limits  CBC WITH DIFFERENTIAL/PLATELET - Abnormal; Notable for the following:    RBC 2.96 (*)    Hemoglobin 7.8 (*)    HCT 24.6 (*)    RDW 18.3 (*)    All other components within normal limits  COMPREHENSIVE METABOLIC PANEL - Abnormal; Notable for the following:    Total Protein 6.3 (*)    Albumin 3.4 (*)    ALT 12 (*)    All other components within normal limits  CBC - Abnormal; Notable for the following:    RBC 3.46 (*)    Hemoglobin 9.1 (*)    HCT 28.2 (*)    RDW 17.4 (*)    All other components within normal limits  IRON AND TIBC - Abnormal; Notable for the following:    Saturation Ratios 7 (*)    All other components within normal limits  FERRITIN - Abnormal; Notable for the following:    Ferritin 8 (*)    All other components within normal limits  RETICULOCYTES - Abnormal; Notable for the following:    RBC. 3.46 (*)    All other components within normal limits  PROTIME-INR  VITAMIN B12  FOLATE  TYPE AND SCREEN  ABO/RH  PREPARE RBC (CROSSMATCH)  SURGICAL PATHOLOGY    EKG  EKG Interpretation  Date/Time:  Wednesday June 06 2016 14:05:16 EDT Ventricular Rate:  79 PR Interval:    QRS Duration: 57 QT Interval:  375 QTC Calculation: 430 R Axis:   70 Text Interpretation:  Sinus rhythm Baseline wander in lead(s) V1 When compared to prior, no significant changes seen. No STEMI Confirmed by Rush Landmark MD, Tyrel Lex [phone]) on 06/06/2016 2:33:19 PM       Radiology No results found.  Procedures Procedures (including critical care time)  CRITICAL CARE Performed by: Canary Brim Pleasant Bensinger Total critical care time: 45 minutes Critical care time was exclusive of separately billable procedures and treating other patients. Critical care was necessary to treat or prevent imminent or life-threatening deterioration. Critical care was time spent personally by me on the following  activities: development of treatment plan with patient and/or surrogate as well as nursing, discussions with consultants, evaluation of patient's response to treatment, examination of patient, obtaining history from patient or surrogate, ordering and performing treatments and interventions, ordering and review of laboratory studies, ordering and review  of radiographic studies, pulse oximetry and re-evaluation of patient's condition.   Medications Ordered in ED Medications  0.9 %  sodium chloride infusion ( Intravenous MAR Unhold 06/07/16 1137)  atorvastatin (LIPITOR) tablet 40 mg ( Oral MAR Unhold 06/07/16 1137)  losartan (COZAAR) tablet 100 mg ( Oral MAR Unhold 06/07/16 1137)  acetaminophen (TYLENOL) tablet 650 mg ( Oral MAR Unhold 06/07/16 1137)    Or  acetaminophen (TYLENOL) suppository 650 mg ( Rectal MAR Unhold 06/07/16 1137)  polyethylene glycol (MIRALAX / GLYCOLAX) packet 17 g ( Oral MAR Unhold 06/07/16 1137)  ondansetron (ZOFRAN) tablet 4 mg ( Oral MAR Unhold 06/07/16 1137)    Or  ondansetron (ZOFRAN) injection 4 mg ( Intravenous MAR Unhold 06/07/16 1137)  pantoprazole (PROTONIX) EC tablet 40 mg (not administered)  pantoprazole (PROTONIX) injection 40 mg (40 mg Intravenous Given 06/06/16 1556)  sodium chloride 0.9 % bolus 1,000 mL (0 mLs Intravenous Stopped 06/06/16 1754)  famotidine (PEPCID) IVPB 20 mg premix (0 mg Intravenous Stopped 06/06/16 2112)     Initial Impression / Assessment and Plan / ED Course  I have reviewed the triage vital signs and the nursing notes.  Pertinent labs & imaging results that were available during my care of the patient were reviewed by me and considered in my medical decision making (see chart for details).      Sara Dominguez is a 76 y.o. female with a past medical history significant for CAD with PCI 4, hypertension, hyperlipidemia, PAD with stenting 3 in right leg on aspirin and Plavix, and AAA who presents from her PCP office for GI bleed. Patient  reports that for the last 2 weeks, she has had dark stools. She reports she has also had some constipation. She reports intermittent episodes of abdominal cramping generalized in location. She describes the pain as moderate and nonradiating. She says nothing made it better. She reports feeling very fatigued and weakness all over including her legs bilaterally. She says that she has been laying around with her fatigue. She reports that 3 episodes in the last few days she has felt near syncopal due to the fatigue. This improved with rest.  Patient saw her doctor yesterday and had a hemoglobin in the 8 range. According to patient, she is normally in the 13 range. She then saw her doctor again today who did a Hemoccult test was positive and her hemoglobin was now in the sevens. As she is on aspirin and Plavix, patient was sent to the ED for further workup and likely admission.  Patient denies any chest pain or palpitations. She denies any shortness of breath. She has no focal neurologic deficits. She says she has episodes of tingling in her extremities that is not present currently. She says she has been having right leg pain that has also resolved currently.  Patient had screening laboratory tests on arrival show a hemoglobin of 7.8. INR normal at 0.97. CMP grossly unremarkable.  Patient's gastric urology team, equal GI will be called. Patient will then likely be admitted for symptomatic anemia from a GI bleed on antiplatelet medications.   3:14 PM Gastroenterology returned the page and they requested patient be given Protonix, fluids for rehydration, and they will see the patient. They feel that she will likely need an upper endoscopy tomorrow. They did not feel she needed acute transfusion at this time.   They recommended admission to the hospitalist service. Hospitalist team called for admission.    Final Clinical Impressions(s) / ED Diagnoses  Final diagnoses:  Symptomatic anemia    Gastrointestinal hemorrhage, unspecified gastrointestinal hemorrhage type     Clinical Impression: 1. Symptomatic anemia   2. Gastrointestinal hemorrhage, unspecified gastrointestinal hemorrhage type     Disposition: Admit to Hospitalist service    Heide Scales, MD 06/07/16 1249

## 2016-06-06 NOTE — ED Notes (Signed)
PATIENT SIGNED INFORMED CONSENT FOR BLOOD PRODUCTS TRANSFUSION. WITNESSED BY Terrilyn Saver RN.

## 2016-06-07 ENCOUNTER — Encounter (HOSPITAL_COMMUNITY)
Admission: EM | Disposition: A | Discharge status: Home / Self Care | Payer: Self-pay | Source: Home / Self Care | Attending: Family Medicine

## 2016-06-07 ENCOUNTER — Inpatient Hospital Stay (HOSPITAL_COMMUNITY): Payer: PPO | Admitting: Anesthesiology

## 2016-06-07 ENCOUNTER — Encounter (HOSPITAL_COMMUNITY): Payer: Self-pay | Admitting: Anesthesiology

## 2016-06-07 DIAGNOSIS — R195 Other fecal abnormalities: Secondary | ICD-10-CM | POA: Diagnosis not present

## 2016-06-07 DIAGNOSIS — R531 Weakness: Secondary | ICD-10-CM | POA: Diagnosis not present

## 2016-06-07 DIAGNOSIS — I2510 Atherosclerotic heart disease of native coronary artery without angina pectoris: Secondary | ICD-10-CM | POA: Diagnosis not present

## 2016-06-07 DIAGNOSIS — K921 Melena: Secondary | ICD-10-CM | POA: Diagnosis not present

## 2016-06-07 DIAGNOSIS — K274 Chronic or unspecified peptic ulcer, site unspecified, with hemorrhage: Secondary | ICD-10-CM

## 2016-06-07 DIAGNOSIS — K254 Chronic or unspecified gastric ulcer with hemorrhage: Secondary | ICD-10-CM | POA: Diagnosis not present

## 2016-06-07 DIAGNOSIS — K259 Gastric ulcer, unspecified as acute or chronic, without hemorrhage or perforation: Secondary | ICD-10-CM | POA: Diagnosis not present

## 2016-06-07 DIAGNOSIS — D62 Acute posthemorrhagic anemia: Secondary | ICD-10-CM | POA: Diagnosis not present

## 2016-06-07 HISTORY — PX: ESOPHAGOGASTRODUODENOSCOPY (EGD) WITH PROPOFOL: SHX5813

## 2016-06-07 LAB — BPAM RBC
BLOOD PRODUCT EXPIRATION DATE: 201805182359
ISSUE DATE / TIME: 201804252044
UNIT TYPE AND RH: 7300

## 2016-06-07 LAB — COMPREHENSIVE METABOLIC PANEL
ALT: 12 U/L — ABNORMAL LOW (ref 14–54)
ANION GAP: 9 (ref 5–15)
AST: 17 U/L (ref 15–41)
Albumin: 3.4 g/dL — ABNORMAL LOW (ref 3.5–5.0)
Alkaline Phosphatase: 62 U/L (ref 38–126)
BUN: 11 mg/dL (ref 6–20)
CHLORIDE: 108 mmol/L (ref 101–111)
CO2: 22 mmol/L (ref 22–32)
Calcium: 9 mg/dL (ref 8.9–10.3)
Creatinine, Ser: 0.81 mg/dL (ref 0.44–1.00)
GFR calc non Af Amer: >60 mL/min (ref >60)
Glucose, Bld: 93 mg/dL (ref 65–99)
POTASSIUM: 4.1 mmol/L (ref 3.5–5.1)
Sodium: 139 mmol/L (ref 135–145)
Total Bilirubin: 0.6 mg/dL (ref 0.3–1.2)
Total Protein: 6.3 g/dL — ABNORMAL LOW (ref 6.5–8.1)

## 2016-06-07 LAB — FERRITIN: FERRITIN: 8 ng/mL — AB (ref 11–307)

## 2016-06-07 LAB — IRON AND TIBC
IRON: 31 ug/dL (ref 28–170)
Saturation Ratios: 7 % — ABNORMAL LOW (ref 10.4–31.8)
TIBC: 414 ug/dL (ref 250–450)
UIBC: 383 ug/dL

## 2016-06-07 LAB — CBC
HEMATOCRIT: 28.2 % — AB (ref 36.0–46.0)
HEMOGLOBIN: 9.1 g/dL — AB (ref 12.0–15.0)
MCH: 26.3 pg (ref 26.0–34.0)
MCHC: 32.3 g/dL (ref 30.0–36.0)
MCV: 81.5 fL (ref 78.0–100.0)
Platelets: 286 10*3/uL (ref 150–400)
RBC: 3.46 MIL/uL — AB (ref 3.87–5.11)
RDW: 17.4 % — ABNORMAL HIGH (ref 11.5–15.5)
WBC: 8.8 10*3/uL (ref 4.0–10.5)

## 2016-06-07 LAB — TYPE AND SCREEN
ABO/RH(D): B POS
Antibody Screen: NEGATIVE
UNIT DIVISION: 0

## 2016-06-07 LAB — FOLATE: FOLATE: 11.3 ng/mL (ref >5.9)

## 2016-06-07 LAB — RETICULOCYTES
RBC.: 3.46 MIL/uL — ABNORMAL LOW (ref 3.87–5.11)
Retic Count, Absolute: 76.1 10*3/uL (ref 19.0–186.0)
Retic Ct Pct: 2.2 % (ref 0.4–3.1)

## 2016-06-07 LAB — VITAMIN B12: Vitamin B-12: 298 pg/mL (ref 180–914)

## 2016-06-07 SURGERY — ESOPHAGOGASTRODUODENOSCOPY (EGD) WITH PROPOFOL
Anesthesia: Monitor Anesthesia Care

## 2016-06-07 MED ORDER — LIDOCAINE 2% (20 MG/ML) 5 ML SYRINGE
INTRAMUSCULAR | Status: DC | PRN
  Administered 2016-06-07: 100 mg via INTRAVENOUS

## 2016-06-07 MED ORDER — PANTOPRAZOLE SODIUM 40 MG PO TBEC: 40.0000 mg | DELAYED_RELEASE_TABLET | Freq: Two times a day (BID) | ORAL | 0 refills | Status: DC

## 2016-06-07 MED ORDER — PROPOFOL 500 MG/50ML IV EMUL
INTRAVENOUS | Status: DC | PRN
  Administered 2016-06-07: 140 ug/kg/min via INTRAVENOUS

## 2016-06-07 MED ORDER — FERROUS SULFATE 325 (65 FE) MG PO TBEC: 325.0000 mg | DELAYED_RELEASE_TABLET | Freq: Every day | ORAL | 0 refills | Status: DC

## 2016-06-07 MED ORDER — PROPOFOL 10 MG/ML IV BOLUS
INTRAVENOUS | Status: DC | PRN
  Administered 2016-06-07 (×4): 20 mg via INTRAVENOUS

## 2016-06-07 MED ORDER — PROPOFOL 10 MG/ML IV BOLUS
INTRAVENOUS | Status: AC
  Filled 2016-06-07: qty 40

## 2016-06-07 MED ORDER — PANTOPRAZOLE SODIUM 40 MG PO TBEC: 40.0000 mg | DELAYED_RELEASE_TABLET | Freq: Two times a day (BID) | ORAL | Status: DC

## 2016-06-07 MED ORDER — LIDOCAINE 2% (20 MG/ML) 5 ML SYRINGE
INTRAMUSCULAR | Status: AC
  Filled 2016-06-07: qty 5

## 2016-06-07 SURGICAL SUPPLY — 15 items

## 2016-06-07 NOTE — Progress Notes (Signed)
D/w Dr. Alvino Chapel - in light of the patient's bleeding ulcer, I'm in agreement to hold aspirin and plavix until the ulcer heals. Stent was placed in 2015 but should be well-healed.  Thanks for notifying us.  Chrystie Nose, MD, [facility]  [place]  [facility]  Attending Cardiologist  Direct Dial: [phone]  Fax: [phone]  Website:  [email or web address]

## 2016-06-07 NOTE — Anesthesia Preprocedure Evaluation (Signed)
Anesthesia Evaluation  Patient identified by MRN, date of birth, ID band Patient awake    Reviewed: Allergy & Precautions, NPO status , Patient's Chart, lab work & pertinent test results  Airway Mallampati: II  TM Distance: >3 FB Neck ROM: Full    Dental no notable dental hx.    Pulmonary neg pulmonary ROS, former smoker,    Pulmonary exam normal breath sounds clear to auscultation       Cardiovascular hypertension, + CAD, + Cardiac Stents and + Peripheral Vascular Disease  Normal cardiovascular exam Rhythm:Regular Rate:Normal     Neuro/Psych negative neurological ROS  negative psych ROS   GI/Hepatic negative GI ROS, Neg liver ROS,   Endo/Other  negative endocrine ROS  Renal/GU negative Renal ROS  negative genitourinary   Musculoskeletal negative musculoskeletal ROS (+)   Abdominal   Peds negative pediatric ROS (+)  Hematology  (+) anemia ,   Anesthesia Other Findings   Reproductive/Obstetrics negative OB ROS                             Anesthesia Physical Anesthesia Plan  ASA: III  Anesthesia Plan: MAC   Post-op Pain Management:    Induction: Intravenous  Airway Management Planned: Nasal Cannula  Additional Equipment:   Intra-op Plan:   Post-operative Plan:   Informed Consent: I have reviewed the patients History and Physical, chart, labs and discussed the procedure including the risks, benefits and alternatives for the proposed anesthesia with the patient or authorized representative who has indicated his/her understanding and acceptance.   Dental advisory given  Plan Discussed with: CRNA and Surgeon  Anesthesia Plan Comments:         Anesthesia Quick Evaluation

## 2016-06-07 NOTE — Op Note (Signed)
[facility] Patient Name: Sara Dominguez Procedure Date: 06/07/2016 MRN: [id number] Attending MD: Bernette Redbird , MD Date of Birth: 11-01-1940 CSN: [id number] Age: 76 Admit Type: Inpatient Procedure:                Upper GI endoscopy Indications:              Acute post hemorrhagic anemia with dark stools in a                            patient on ASA and Plavix Providers:                Bernette Redbird, MD, Janae Sauce. Steele Berg, RN, [employer], Technician, Zoila Shutter, Technician, Leroy Libman, CRNA Referring MD:              Medicines:                Monitored Anesthesia Care Complications:            No immediate complications. Estimated Blood Loss:     Estimated blood loss was minimal. Procedure:                Pre-Anesthesia Assessment:                           - Prior to the procedure, a History and Physical                            was performed, and patient medications and                            allergies were reviewed. The patient's tolerance of                            previous anesthesia was also reviewed. The risks                            and benefits of the procedure and the sedation                            options and risks were discussed with the patient.                            All questions were answered, and informed consent                            was obtained. Prior Anticoagulants: The patient has                            taken aspirin, last dose was 1 day prior to  procedure. ASA Grade Assessment: III - A patient                            with severe systemic disease. After reviewing the                            risks and benefits, the patient was deemed in                            satisfactory condition to undergo the procedure.                           After obtaining informed consent, the endoscope was                            passed  under direct vision. Throughout the                            procedure, the patient's blood pressure, pulse, and                            oxygen saturations were monitored continuously. The                            [id number] scope was introduced through the                            mouth, and advanced to the second part of duodenum.                            The upper GI endoscopy was accomplished without                            difficulty. The patient tolerated the procedure                            well. Scope In: Scope Out: Findings:      A mild Schatzki ring (acquired) was found at the gastroesophageal       junction.      A 3 cm hiatal hernia was present.      The exam of the esophagus was otherwise normal.      The stomach contained a small amount of bile, but no blood or coffee       grounds.      One non-bleeding cratered gastric ulcer with no stigmata of bleeding was       found on the lesser curvature of the stomach and in the prepyloric       region of the stomach. The lesion was 8 mm in largest dimension.       Biopsies were taken with a cold forceps for histology from the antrum of       the stomach.      The exam of the stomach was otherwise normal.      The cardia and gastric fundus were normal on retroflexion.      The examined duodenum  was normal. Impression:               - Mild Schatzki ring.                           - 3 cm hiatal hernia.                           - Non-bleeding gastric ulcer with no stigmata of                            bleeding. Biopsied.                           - Normal examined duodenum. Moderate Sedation:      This patient was sedated with monitored anesthesia care, not moderate       sedation. Recommendation:           - Await pathology results.                           - Continue present medications.                           - Repeat upper endoscopy in 3 months to check                            healing.                            - Resume regular diet today. Procedure Code(s):        --- Professional ---                           [phone], Esophagogastroduodenoscopy, flexible,                            transoral; with biopsy, single or multiple Diagnosis Code(s):        --- Professional ---                           K25.9, Gastric ulcer, unspecified as acute or                            chronic, without hemorrhage or perforation                           D62, Acute posthemorrhagic anemia CPT copyright 2016 American Medical Association. All rights reserved. The codes documented in this report are preliminary and upon coder review may  be revised to meet current compliance requirements. Bernette Redbird, MD 06/07/2016 11:21:08 AM This report has been signed electronically. Number of Addenda: 0

## 2016-06-07 NOTE — Progress Notes (Signed)
Pt's denture and partial given to patient, who placed in her mouth and glasses placed back on patients face. Weston Settle, RN

## 2016-06-07 NOTE — H&P (View-Only) (Signed)
Referring Provider:  Dr. Sharion Dove ([facility]) Primary Care Physician:  Neldon Labella, MD Primary Gastroenterologist:  Dr. Dorena Cookey  Reason for Consultation:  Heme positive dark stool and anemia  HPI: Sara Dominguez is a 76 y.o. female who is several weeks status post colonoscopy by Dr. Madilyn Fireman, which was entirely normal, no polyps or vascular ectasia (has prior history of polyps).   More recently, she began to notice dark, but not tarry stools, associated with weakness and dyspepsia in recent days. At her primary physician's office yesterday, hemoglobin was 8.3, as compared to 13.12 years earlier.  She is on 81 mg aspirin daily for coronary and peripheral vascular arterial stents, and she also uses to ibuprofen most days because of shoulder pain, and has done so for 6 months.   No prior history of ulcer disease or GI bleeding or endoscopic evaluation.   The patient is a previous smoker, not currently. Social ethanol.   On presentation to the emergency room, hemoglobin was 7.8 with an MCV of 83. She is ordered already for 1 unit of packed red cells.   Past Medical History:  Diagnosis Date  . AAA (abdominal aortic aneurysm), small, history of 2.3 X 2.4 07/06/2011  . Abnormal ankle brachial index 07/06/2011  . Anxiety   . CAD (coronary artery disease), with LAD stent in 2006, chronically occluded OM branch with hx. of Stent to RCA  07/06/2011  . Claudication in peripheral vascular disease, life style limiting 07/06/2011  . Hyperlipidemia LDL goal < 70 09/08/2012  . Hypertension   . PAD (peripheral artery disease) (HCC)     Past Surgical History:  Procedure Laterality Date  . ABDOMINAL AORTAGRAM N/A 07/05/2011   Procedure: ABDOMINAL Ronny Flurry;  Surgeon: Runell Gess, MD;  Location: [facility] CATH LAB;  Service: Cardiovascular;  Laterality: N/A;  . ABDOMINAL HYSTERECTOMY  1992  . CATARACT EXTRACTION W/ INTRAOCULAR LENS  IMPLANT, BILATERAL Bilateral ~ 2006  . CORONARY ANGIOPLASTY  WITH STENT PLACEMENT  2006; 2009; 2013   "1 + 1 + 2";  total of 4"  . FEMORAL ARTERY STENT Right 2006; 07/05/2011; 05/14/2013  . FEMORAL ARTERY STENT Right 05/14/13   between previous stens  . LOWER EXTREMITY ANGIOGRAM N/A 07/05/2011   Procedure: LOWER EXTREMITY ANGIOGRAM;  Surgeon: Runell Gess, MD;  Location: [facility] CATH LAB;  Service: Cardiovascular;  Laterality: N/A;  . LOWER EXTREMITY ANGIOGRAM N/A 05/14/2013   Procedure: LOWER EXTREMITY ANGIOGRAM;  Surgeon: Runell Gess, MD;  Location: [facility] CATH LAB;  Service: Cardiovascular;  Laterality: N/A;  . LUMBAR DISC SURGERY  1980;; 1982  . PERCUTANEOUS STENT INTERVENTION Right 07/05/2011   Procedure: PERCUTANEOUS STENT INTERVENTION;  Surgeon: Runell Gess, MD;  Location: [facility] CATH LAB;  Service: Cardiovascular;  Laterality: Right;  . PERIPHERAL VASCULAR CATHETERIZATION N/A 11/04/2014   Procedure: Lower Extremity Angiography;  Surgeon: Runell Gess, MD;  Location: [facility] INVASIVE CV LAB;  Service: Cardiovascular;  Laterality: N/A;  . PERIPHERAL VASCULAR CATHETERIZATION Right 11/04/2014   Procedure: Peripheral Vascular Intervention;  Surgeon: Runell Gess, MD;  Location: [facility] INVASIVE CV LAB;  Service: Cardiovascular;  Laterality: Right;  SFA  . SHOULDER OPEN ROTATOR CUFF REPAIR Left ~ 2004    Prior to Admission medications   Medication Sig Start Date End Date Taking? Authorizing Provider  aspirin EC 81 MG tablet Take 81 mg by mouth daily.   Yes Historical Provider, MD  atorvastatin (LIPITOR) 40 MG tablet Take 1 tablet (40 mg total) by mouth daily. Please schedule  appointment for refills. 10/28/15  Yes Runell Gess, MD  clopidogrel (PLAVIX) 75 MG tablet Take 1 tablet (75 mg total) by mouth daily. 03/21/16  Yes Runell Gess, MD  losartan (COZAAR) 100 MG tablet Take 1 tablet (100 mg total) by mouth daily. 05/06/15  Yes Runell Gess, MD  Omega-3 Fatty Acids (FISH OIL) 1200 MG CAPS Take 1,200 mg by mouth daily.   Yes Historical Provider, MD   pantoprazole (PROTONIX) 20 MG tablet Take 20 mg by mouth 2 (two) times daily.   Yes Historical Provider, MD    Current Facility-Administered Medications  Medication Dose Route Frequency Provider Last Rate Last Dose  . 0.9 %  sodium chloride infusion   Intravenous Once Heide Scales, MD       Current Outpatient Prescriptions  Medication Sig Dispense Refill  . aspirin EC 81 MG tablet Take 81 mg by mouth daily.    Marland Kitchen atorvastatin (LIPITOR) 40 MG tablet Take 1 tablet (40 mg total) by mouth daily. Please schedule appointment for refills. 90 tablet 1  . clopidogrel (PLAVIX) 75 MG tablet Take 1 tablet (75 mg total) by mouth daily. 90 tablet 2  . losartan (COZAAR) 100 MG tablet Take 1 tablet (100 mg total) by mouth daily. 30 tablet 0  . Omega-3 Fatty Acids (FISH OIL) 1200 MG CAPS Take 1,200 mg by mouth daily.    . pantoprazole (PROTONIX) 20 MG tablet Take 20 mg by mouth 2 (two) times daily.      Allergies as of 06/06/2016  . (No Known Allergies)    Family History  Problem Relation Age of Onset  . Cancer Mother 25  . Heart attack Father     died 59  . Parkinson's disease Sister   . Heart attack Maternal Grandmother   . Heart attack Maternal Grandfather 50    died at 21  . Stroke Paternal Grandfather   . Heart disease Sister   . Deep vein thrombosis Daughter     Social History   Social History  . Marital status: Married    Spouse name: N/A  . Number of children: N/A  . Years of education: N/A   Occupational History  . Not on file.   Social History Main Topics  . Smoking status: Former Smoker    Packs/day: 1.00    Years: 50.00    Types: Cigarettes    Quit date: 07/14/2010  . Smokeless tobacco: Never Used  . Alcohol use 3.6 oz/week    6 Glasses of wine per week     Comment: 05/14/2013  "socially; 2-3 glasses of wine twice/wk"  . Drug use: No  . Sexual activity: Not Currently   Other Topics Concern  . Not on file   Social History Narrative  . No narrative on  file    Review of Systems: The patient does report some chest tightness with exertion recently. She also has had shoulder pain attributed to "bursitis" or "arthritis." Poor appetite recently, no weight loss. Rare dysphagia. No chronic abdominal pain or chronic dyspeptic symptoms. Not on PPI therapy. No shortness of breath. No peripheral edema. No joint swelling. Her voice has been somewhat more hoarse recently than at baseline.  Physical Exam: Vital signs in last 24 hours: Temp:  [98.1 F (36.7 C)] 98.1 F (36.7 C) (04/25 1244) Pulse Rate:  [63-105] 63 (04/25 1552) Resp:  [17-18] 18 (04/25 1552) BP: (121-125)/(63-65) 125/63 (04/25 1552) SpO2:  [99 %-100 %] 99 % (04/25 1552) Weight:  [67.6  kg (149 lb)] 67.6 kg (149 lb) (04/25 1244)   General:   Alert,  Well-developed, well-nourished, pleasant and cooperative in NAD Head:  Normocephalic and atraumatic. Eyes:  Sclera clear, no icterus.   Conjunctiva pink. Mouth:   No ulcerations or lesions.  Oropharynx pink & moist. Neck:   No masses or thyromegaly.  Voice is hoarse and deep, c/w prior smoking hx. Lungs:  Clear throughout to auscultation.   No wheezes, crackles, or rhonchi. No evident respiratory distress. Heart:   Regular rate and rhythm with occasional premature beat; no murmurs, clicks, rubs,  or gallops. Abdomen:  Soft, nontender, and nondistended. No masses, hepatosplenomegaly or ventral hernias noted.  Msk:   Symmetrical without gross deformities. Pulses:  Normal radial pulse is noted. Extremities:   Without clubbing, cyanosis, or edema. Neurologic:  Alert and coherent;  grossly normal neurologically. Skin:  Intact without significant lesions or rashes. Palms are pale. Cervical Nodes:  No significant cervical adenopathy. Psych:   Alert and cooperative. Normal mood and affect.  Intake/Output from previous day: No intake/output data recorded. Intake/Output this shift: No intake/output data recorded.  Lab Results:  Recent  Labs  06/06/16 1325  WBC 9.2  HGB 7.8*  HCT 24.6*  PLT 345   BMET  Recent Labs  06/06/16 1325  NA 136  K 4.4  CL 104  CO2 24  GLUCOSE 116*  BUN 18  CREATININE 0.89  CALCIUM 9.3   LFT  Recent Labs  06/06/16 1325  PROT 7.4  ALBUMIN 4.0  AST 20  ALT 14  ALKPHOS 74  BILITOT 0.3   PT/INR  Recent Labs  06/06/16 1325  LABPROT 12.9  INR 0.97    Studies/Results: No results found.  Impression: 1. Subacute GI blood loss, possibly superimposed on chronic GI blood loss (MCV 83) with heme positive stool and significant anemia, in a patient with significant aspirin and nonsteroidal anti-inflammatory drug exposure, also on Plavix which would magnify any degree of blood loss from ulcerogenic medications. 2. Dyspeptic symptomatology, recent onset  Plan: Endoscopic evaluation tomorrow to clarify the picture. Ashby Dawes, purpose, risks reviewed and patient and husband at bedside agreeable. PPI therapy in the meantime.   LOS: 0 days   Ezequiel Macauley V  06/06/2016, 4:03 PM   Pager [phone] If no answer or after 5 PM call [phone]

## 2016-06-07 NOTE — Progress Notes (Signed)
Patient's endoscopy was well tolerated, and is pertinent for a deep, but clean-based antral benign- appearing ulcer (no blood in stomach, no bleeding, no stigmata of hemorrhage).  I have ordered a diet for the patient.  I think the patient, based on endoscopic appearance of her ulcer, and the stability of her hemoglobin overnight, is at low risk for further bleeding. Therefore, consideration could be given to discharge later today, or certainly tomorrow.  I will contact the patient with the biopsies I obtained checking for Helicobacter pylori infection, and I will also arrange office follow-up with me, because the patient will need follow-up endoscopy in a couple of months to confirm ulcer healing.  Florencia Reasons, M.D. Pager [phone] If no answer or after 5 PM call [phone]

## 2016-06-07 NOTE — Discharge Instructions (Signed)
Recommendations for Outpatient Follow-up:  1. Follow up with PCP in 1 week 2. Follow up with Dr. [name]. His office and arrange follow up.  3. Please obtain CBC in 1 week  4. Please follow up on the following pending results: biopsy result 5. Stop all NSAID, ibuprofen use 6. Take Plavix as usual. Stop aspirin for 2 weeks    Peptic Ulcer A peptic ulcer is a sore in the lining of the esophagus (esophageal ulcer), the stomach (gastric ulcer), or the first part of the small intestine (duodenal ulcer). The ulcer causes gradual wearing away (erosion) into the deeper tissue. What are the causes? Normally, the lining of the stomach and the small intestine protects itself from the acid that digests food. The protective lining can be damaged by:  An infection caused by a germ (bacterium) called Helicobacter pylori or H. pylori.  Regular use of NSAIDs, such as ibuprofen or aspirin.  Rare tumors in the stomach, small intestine, or pancreas (Zollinger-Ellison syndrome). What increases the risk? The following factors may make you more likely to develop this condition:  Smoking.  Having a family history of ulcer disease. What are the signs or symptoms? Symptoms of this condition include:  Burning pain or gnawing in the area between the chest and the belly button. The pain may be worse on an empty stomach and at night.  Heartburn.  Nausea and vomiting.  Bloating. If the ulcer results in bleeding, it can cause:  Black, tarry stools.  Vomiting of bright red blood.  Vomiting of material that looks like coffee grounds. How is this diagnosed? This condition may be diagnosed based on:  Medical history and physical exam.  Various tests or procedures, such as:  Blood tests, stool tests, or breath tests to check for the H. pylori bacterium.  An X-ray exam (upper gastrointestinal series) of the esophagus, stomach, and small intestine.  Upper endoscopy. The health care provider examines  the esophagus, stomach, and small intestine using a small flexible tube that has a video camera at the end.  Biopsy. A tissue sample is removed to be examined under a microscope. How is this treated? Treatment for this condition may include:  Eliminating the cause of the ulcer, such as smoking or the use of NSAIDs or alcohol.  Medicines to reduce the amount of acid in your digestive tract.  Antibiotic medicines, if the ulcer is caused by the H. pylori bacterium.  An upper endoscopy to treat a bleeding ulcer.  Surgery, if the bleeding is severe or if the ulcer created a hole somewhere in the digestive system. Follow these instructions at home:  Avoid alcohol and caffeine.  Do not use any tobacco products, such as cigarettes, chewing tobacco, and e-cigarettes. If you need help quitting, ask your health care provider.  Take over-the-counter and prescription medicines only as told by your health care provider. Do not use over-the-counter medicines in place of prescription medicines unless your health care provider approves.  Keep all follow-up visits as told by your health care provider. This is important. Contact a health care provider if:  Your symptoms do not improve within 7 days of starting treatment.  You have ongoing indigestion or heartburn. Get help right away if:  You have sudden, sharp, or persistent pain in your abdomen.  You have bloody or dark black, tarry stools.  You vomit blood or material that looks like coffee grounds.  You become light-headed or you feel faint.  You become weak.  You become sweaty  or clammy. This information is not intended to replace advice given to you by your health care provider. Make sure you discuss any questions you have with your health care provider. Document Released: 01/27/2000 Document Revised: 07/04/2015 Document Reviewed: 10/30/2014 Elsevier Interactive Patient Education  2017 [employer].

## 2016-06-07 NOTE — Progress Notes (Signed)
Addendum to earlier note:  I discussed with Dr. Alvino Chapel what should be done with the patient's Plavix and aspirin.   I have recommended that Dr.Choi check with Dr. Nanetta Batty to see if it would be okay for the patient to be off aspirin for approximately 2-3 weeks, to allow at least preliminary ulcer healing before being reexposed to an ulcerogenic medication.   Since the patient is not currently bleeding, and appears to be at low risk for further bleeding, I would be comfortable with her having antiplatelet therapy (i.e., Plavix) at this time.   Please call me if you would like to discuss the patient's case further.  Florencia Reasons, M.D. Pager [phone] If no answer or after 5 PM call [phone]

## 2016-06-07 NOTE — Transfer of Care (Signed)
Immediate Anesthesia Transfer of Care Note  Patient: Sara Dominguez [facility]  Procedure(s) Performed: Procedure(s): ESOPHAGOGASTRODUODENOSCOPY (EGD) WITH PROPOFOL (N/A)  Patient Location: Endoscopy Unit  Anesthesia Type:MAC  Level of Consciousness: awake  Airway & Oxygen Therapy: Patient Spontanous Breathing and Patient connected to nasal cannula oxygen  Post-op Assessment: Report given to RN and Post -op Vital signs reviewed and stable  Post vital signs: Reviewed and stable  Last Vitals:  Vitals:   06/07/16 0604 06/07/16 1012  BP: 113/65 (!) 181/50  Pulse: 65 67  Resp: 18 14  Temp: 36.7 C 36.9 C    Last Pain:  Vitals:   06/07/16 1012  TempSrc: Oral  PainSc:       Patients Stated Pain Goal: 2 (41/66/06 3016)  Complications: No apparent anesthesia complications

## 2016-06-07 NOTE — Interval H&P Note (Signed)
History and Physical Interval Note:  06/07/2016 10:49 AM  Sara Dominguez  has presented today for surgery, with the diagnosis of heme positive stool and anemia  The various methods of treatment have been discussed with the patient and family. After consideration of risks, benefits and other options for treatment, the patient has consented to  Procedure(s): ESOPHAGOGASTRODUODENOSCOPY (EGD) WITH PROPOFOL (N/A) as a surgical intervention .  The patient's history has been reviewed, patient examined, no change in status, stable for surgery.  I have reviewed the patient's chart and labs.  Questions were answered to the patient's satisfaction.     Sara Dominguez

## 2016-06-07 NOTE — Discharge Summary (Addendum)
Physician Discharge Summary  Sara Dominguez [facility] GEX:[id number] DOB: [birth year] DOA: 06/06/2016  PCP: Neldon Labella, MD  Admit date: 06/06/2016 Discharge date: 06/07/2016  Admitted From: Home Disposition:  Home  Recommendations for Outpatient Follow-up:  1. Follow up with PCP in 1 week 2. Follow up with Dr. [name]. His office and arrange follow up.  3. Please obtain CBC in 1 week  4. Please follow up on the following pending results: biopsy result 5. Stop all NSAID, ibuprofen use 6. Take Plavix as usual. Stop aspirin for 2 weeks   Home Health: No  Equipment/Devices: None   Discharge Condition: Stable CODE STATUS: Full  Diet recommendation: Heart healthy   Brief/Interim Summary: From H&P by Dr. Sharion Dove: Sara Dominguez is a 76 y.o. female with medical history significant of CAD with stent x 4, hypertension, PAD with stent 3 on aspirin and Plavix, AAA, hyperlipidemia, presented from her PCP office after lab workup showed hemoglobin of 8. At her PCP FOBT was done which was positive. Patient reported that she has been weak and have acutely become short of breath and more fatigued for the past week. She reported about 2-3 days ago noticed dark stools. Associated symptoms include heartburn, she PCP is being treated with Protonix. Denies any pain medication. She reported that she drinks socially. Patient denies chest pain, palpitation, dizziness, nausea, vomiting and abdominal pain. GI was consulted and patient underwent EGD on 4/26. They found deep but clean-based antral benign appearing ulcer without blood in stomach or stigmata of hemorrhage. She is instructed to stop all NSAID, ibuprofen use.   Discharge Diagnoses:  Active Problems:   Essential hypertension   GI bleed   Acute blood loss anemia  Upper GI Bleed with acute blood loss anemia  -S/p EGD 4/26 with non-bleeding gastric ulcer   -Received 1u pRBC  -Instructed to stop NSAID, ibuprofen -Spoke with Dr. Rennis Golden, one of Dr.  Hazle Coca partners. Patient's stents were placed in 2015, he thinks it would be okay to hold aspirin for a few weeks.  -Will need outpatient follow up with Dr. [name] and repeat EGD in the near future -Repeat CBC as outpatient   CAD -Follows Dr. [name]  -Continue plavix. Aspirin on hold for 2 weeks as above   Hypertension  -Continue home medications   Discharge Instructions  Discharge Instructions    Call MD for:  extreme fatigue    Complete by:  As directed    Call MD for:  persistant dizziness or light-headedness    Complete by:  As directed    Call MD for:  persistant nausea and vomiting    Complete by:  As directed    Call MD for:  severe uncontrolled pain    Complete by:  As directed    Diet - low sodium heart healthy    Complete by:  As directed    Discharge instructions    Complete by:  As directed    You were cared for by a hospitalist during your hospital stay. If you have any questions about your discharge medications or the care you received while you were in the hospital after you are discharged, you can call the unit and asked to speak with the hospitalist on call if the hospitalist that took care of you is not available. Once you are discharged, your primary care physician will handle any further medical issues. Please note that NO REFILLS for any discharge medications will be authorized once you are discharged, as it is imperative that  you return to your primary care physician (or establish a relationship with a primary care physician if you do not have one) for your aftercare needs so that they can reassess your need for medications and monitor your lab values.   Discharge instructions    Complete by:  As directed    Stop all NSAID, ibuprofen use. You may take tylenol for pain.   Discharge instructions    Complete by:  As directed    Take Plavix as usual. Stop aspirin for 2 weeks.   Increase activity slowly    Complete by:  As directed      Allergies as of  06/07/2016   No Known Allergies     Medication List    STOP taking these medications   aspirin EC 81 MG tablet     TAKE these medications   atorvastatin 40 MG tablet Commonly known as:  LIPITOR Take 1 tablet (40 mg total) by mouth daily. Please schedule appointment for refills.   clopidogrel 75 MG tablet Commonly known as:  PLAVIX Take 1 tablet (75 mg total) by mouth daily.   ferrous sulfate 325 (65 FE) MG EC tablet Take 1 tablet (325 mg total) by mouth daily with breakfast.   Fish Oil 1200 MG Caps Take 1,200 mg by mouth daily.   losartan 100 MG tablet Commonly known as:  COZAAR Take 1 tablet (100 mg total) by mouth daily.   pantoprazole 40 MG tablet Commonly known as:  PROTONIX Take 1 tablet (40 mg total) by mouth 2 (two) times daily. What changed:  medication strength  how much to take      Follow-up Information    Neldon Labella, MD. Schedule an appointment as soon as possible for a visit in 1 week(s).   Specialty:  Family Medicine Contact information: [address] [place] [address] [phone]        Sara Reasons, MD Follow up.   Specialty:  Gastroenterology Why:  Office will call to arrange for appointment  Contact information: [address] [place] [address] [phone]          No Known Allergies  Consultations:  GI   Procedures/Studies: No results found.  EGD 4/26 - Mild Schatzki ring. - 3 cm hiatal hernia. - Non-bleeding gastric ulcer with no stigmata of bleeding. Biopsied. - Normal examined duodenum.   Discharge Exam: Vitals:   06/07/16 1130 06/07/16 1146  BP: (!) 121/40 131/75  Pulse: 80 79  Resp: 20 18  Temp:  98.1 F (36.7 C)   Vitals:   06/07/16 1012 06/07/16 1120 06/07/16 1130 06/07/16 1146  BP: (!) 181/50 (!) 132/43 (!) 121/40 131/75  Pulse: 67 85 80 79  Resp: 14 (!) Temp: 98.4 F (36.9 C) 98.9 F (37.2 C)  98.1 F (36.7 C)  TempSrc: Oral Oral  Oral  SpO2:  98% 97% 97% 96%  Weight:      Height:        General: Pt is alert, awake, not in acute distress Cardiovascular: RRR, S1/S2 +, no rubs, no gallops Respiratory: CTA bilaterally, no wheezing, no rhonchi Abdominal: Soft, NT, ND, bowel sounds + Extremities: no edema, no cyanosis    The results of significant diagnostics from this hospitalization (including imaging, microbiology, ancillary and laboratory) are listed below for reference.     Microbiology: No results found for this or any previous visit (from the past 240 hour(s)).   Labs: BNP (last 3 results) No  results for input(s): BNP in the last 8760 hours. Basic Metabolic Panel:  Recent Labs Lab 06/06/16 1325 06/07/16 0609  NA 136 139  K 4.4 4.1  CL 104 108  CO2 24 22  GLUCOSE 116* 93  BUN 18 11  CREATININE 0.89 0.81  CALCIUM 9.3 9.0   Liver Function Tests:  Recent Labs Lab 06/06/16 1325 06/07/16 0609  AST 20 17  ALT 14 12*  ALKPHOS 74 62  BILITOT 0.3 0.6  PROT 7.4 6.3*  ALBUMIN 4.0 3.4*   No results for input(s): LIPASE, AMYLASE in the last 168 hours. No results for input(s): AMMONIA in the last 168 hours. CBC:  Recent Labs Lab 06/06/16 1325 06/07/16 0609  WBC 9.2 8.8  NEUTROABS 6.2  --   HGB 7.8* 9.1*  HCT 24.6* 28.2*  MCV 83.1 81.5  PLT 345 286   Cardiac Enzymes: No results for input(s): CKTOTAL, CKMB, CKMBINDEX, TROPONINI in the last 168 hours. BNP: Invalid input(s): POCBNP CBG: No results for input(s): GLUCAP in the last 168 hours. D-Dimer No results for input(s): DDIMER in the last 72 hours. Hgb A1c No results for input(s): HGBA1C in the last 72 hours. Lipid Profile No results for input(s): CHOL, HDL, LDLCALC, TRIG, CHOLHDL, LDLDIRECT in the last 72 hours. Thyroid function studies No results for input(s): TSH, T4TOTAL, T3FREE, THYROIDAB in the last 72 hours.  Invalid input(s): FREET3 Anemia work up  Recent Labs  06/07/16 0609  VITAMINB12 298  FOLATE 11.3  FERRITIN 8*  TIBC  414  IRON 31  RETICCTPCT 2.2   Urinalysis No results found for: COLORURINE, APPEARANCEUR, LABSPEC, PHURINE, GLUCOSEU, HGBUR, BILIRUBINUR, KETONESUR, PROTEINUR, UROBILINOGEN, NITRITE, LEUKOCYTESUR Sepsis Labs Invalid input(s): PROCALCITONIN,  WBC,  LACTICIDVEN Microbiology No results found for this or any previous visit (from the past 240 hour(s)).   Time coordinating discharge: 45 minutes  SIGNED:  Noralee Stain, DO [facility] Pager [phone]  If 7PM-7AM, please contact night-coverage [email or web address] Password [email or web address] 06/07/2016, 2:26 PM

## 2016-06-10 NOTE — Telephone Encounter (Signed)
Nothing further to do.

## 2016-06-11 ENCOUNTER — Ambulatory Visit: Payer: PPO | Admitting: Cardiology

## 2016-06-11 ENCOUNTER — Encounter (HOSPITAL_COMMUNITY): Payer: Self-pay | Admitting: Gastroenterology

## 2016-06-11 NOTE — Anesthesia Postprocedure Evaluation (Addendum)
Anesthesia Post Note  Patient: Symiah Nowotny [facility]  Procedure(s) Performed: Procedure(s) (LRB): ESOPHAGOGASTRODUODENOSCOPY (EGD) WITH PROPOFOL (N/A)  Patient location during evaluation: PACU Anesthesia Type: MAC Level of consciousness: awake and alert Pain management: pain level controlled Vital Signs Assessment: post-procedure vital signs reviewed and stable Respiratory status: spontaneous breathing, nonlabored ventilation, respiratory function stable and patient connected to nasal cannula oxygen Cardiovascular status: stable and blood pressure returned to baseline Anesthetic complications: no       Last Vitals:  Vitals:   06/07/16 1130 06/07/16 1146  BP: (!) 121/40 131/75  Pulse: 80 79  Resp: 20 18  Temp:  36.7 C    Last Pain:  Vitals:   06/07/16 1146  TempSrc: Oral  PainSc: 0-No pain                 Halei Hanover S

## 2016-06-12 DIAGNOSIS — K250 Acute gastric ulcer with hemorrhage: Secondary | ICD-10-CM | POA: Diagnosis not present

## 2016-06-25 ENCOUNTER — Telehealth: Payer: Self-pay | Admitting: Physician Assistant

## 2016-06-26 ENCOUNTER — Ambulatory Visit: Payer: PPO | Admitting: Physician Assistant

## 2016-06-26 DIAGNOSIS — S86112A Strain of other muscle(s) and tendon(s) of posterior muscle group at lower leg level, left leg, initial encounter: Secondary | ICD-10-CM | POA: Diagnosis not present

## 2016-06-28 NOTE — Telephone Encounter (Signed)
close encounter °

## 2016-07-03 ENCOUNTER — Ambulatory Visit (INDEPENDENT_AMBULATORY_CARE_PROVIDER_SITE_OTHER): Payer: PPO | Admitting: Physician Assistant

## 2016-07-03 ENCOUNTER — Encounter: Payer: Self-pay | Admitting: Physician Assistant

## 2016-07-03 VITALS — BP 132/70 | HR 74 | Ht 67.0 in | Wt 147.4 lb

## 2016-07-03 DIAGNOSIS — I2510 Atherosclerotic heart disease of native coronary artery without angina pectoris: Secondary | ICD-10-CM

## 2016-07-03 DIAGNOSIS — I739 Peripheral vascular disease, unspecified: Secondary | ICD-10-CM | POA: Diagnosis not present

## 2016-07-03 DIAGNOSIS — I779 Disorder of arteries and arterioles, unspecified: Secondary | ICD-10-CM

## 2016-07-03 NOTE — Patient Instructions (Addendum)
Medication Instructions:  Your physician recommends that you continue on your current medications as directed. Please refer to the Current Medication list given to you today.  Labwork: NONE  Testing/Procedures: NONE AT THIS TIME, WILL DISCUSS WITH DR BERRY AND CALL IF ANYTHING NEEDS TO BE ORDERED   Follow-Up: YOU SHOULD GET A LETTER IN October TO CALL AND GET APPOINTMENT WITH DR Allyson SabalBERRY SCHEDULED   If you need a refill on your cardiac medications before your next appointment, please call your pharmacy.

## 2016-07-03 NOTE — Progress Notes (Signed)
Cardiology Office Note   Date:  07/03/2016   ID:  Sara Dominguez, DOB 04/23/1940, MRN [id number]  PCP:  Sigmund HazelMiller, Lisa, MD  Cardiologist:  Dr. Allyson SabalBerry, 12/29/2015 Theodore DemarkBarrett, Nawaf Strange, PA-C   Chief Complaint  Patient presents with  . 6 month visit    pt states no Sx.    History of Present Illness: Sara Dominguez is a 76 y.o. female with a history of LAD stent 2006 w/ OM 100% and stent RCA 2011 & 2015, HTN, HLD, PAD w/ mild L and mod R carotid dz, R-SFA stent 2006, 2013 & 2016, anxiety  12/29/2015 office visit, Myoview ordered and was normal Admit 04/25-04/26/2018 for ABL anemia w/ +FOBT, antral ulcer found on EGD and aspirin/Plavix on hold for 2 weeks. Pt restarted the Plavix.  Sara Dominguez presents for cardiology follow up.  Sara Dominguez admits that she got into trouble taking ibuprofen with Plavix. She thought that would be ok.   She has a torn tendon in her L foot, is in a walking boot. That is getting better.   Before the injury, she was doing water aerobics twice a week and attending other exercise classes as well. Since her injury, she has not gone to the gym, plans to start back when she can. She has not had chest pain or SOB with exertion. She has had some R thigh cramping, especially at night. It is not every night.   She has a little tingling in her R toes, has had that for a long time. Her right foot is a little cooler than the left, that is normal for her.   Past Medical History:  Diagnosis Date  . AAA (abdominal aortic aneurysm), small, history of 2.3 X 2.4 07/06/2011  . Abnormal ankle brachial index 07/06/2011  . Anxiety   . CAD (coronary artery disease), with LAD stent in 2006, chronically occluded OM branch with hx. of Stent to RCA  07/06/2011  . Claudication in peripheral vascular disease, life style limiting 07/06/2011  . Hyperlipidemia LDL goal < 70 09/08/2012  . Hypertension   . PAD (peripheral artery disease) (HCC)     Past Surgical History:    Procedure Laterality Date  . ABDOMINAL AORTAGRAM N/A 07/05/2011   Procedure: ABDOMINAL Ronny FlurryAORTAGRAM;  Surgeon: Runell GessJonathan J Berry, MD;  [facility];  Service: Cardiovascular;  Laterality: N/A;  . ABDOMINAL HYSTERECTOMY  1992  . CATARACT EXTRACTION W/ INTRAOCULAR LENS  IMPLANT, BILATERAL Bilateral ~ 2006  . CORONARY ANGIOPLASTY WITH STENT PLACEMENT  2006; 2009; 2013   "1 + 1 + 2";  total of 4"  . ESOPHAGOGASTRODUODENOSCOPY (EGD) WITH PROPOFOL N/A 06/07/2016   Procedure: ESOPHAGOGASTRODUODENOSCOPY (EGD) WITH PROPOFOL;  Surgeon: Bernette Redbirdobert Buccini, MD;  Location: [facility] ENDOSCOPY;  Service: Endoscopy;  Laterality: N/A;  . FEMORAL ARTERY STENT Right 2006; 07/05/2011; 05/14/2013  . FEMORAL ARTERY STENT Right 05/14/13   between previous stens  . LOWER EXTREMITY ANGIOGRAM N/A 07/05/2011   Procedure: LOWER EXTREMITY ANGIOGRAM;  Surgeon: Runell GessJonathan J Berry, MD;  [facility];  Service: Cardiovascular;  Laterality: N/A;  . LOWER EXTREMITY ANGIOGRAM N/A 05/14/2013   Procedure: LOWER EXTREMITY ANGIOGRAM;  Surgeon: Runell GessJonathan J Berry, MD;  [facility];  Service: Cardiovascular;  Laterality: N/A;  . LUMBAR DISC SURGERY  1980;; 1982  . PERCUTANEOUS STENT INTERVENTION Right 07/05/2011   Procedure: PERCUTANEOUS STENT INTERVENTION;  Surgeon: Runell GessJonathan J Berry, MD;  [facility];  Service: Cardiovascular;  Laterality: Right;  . PERIPHERAL VASCULAR  CATHETERIZATION N/A 11/04/2014   Procedure: Lower Extremity Angiography;  Surgeon: Runell Gess, MD;  Location[facility] IN[facility]AS[facility] CV LAB;  Service: Cardiovascular;  Laterality: N/A;  . PERIPHERAL VASCULAR CATHETERIZATION Right 11/04/2014   Procedure: Peripheral Vascular Intervention;  Surgeon: Runell Gess, MD;  Location: [facility] INVASIVE CV LAB;  Service: Cardiovascular;  Laterality: Right;  SFA  . SHOULDER OPEN ROTATOR CUFF REPAIR Left ~ 2004    Current Outpatient Prescriptions  Medication Sig Dispense Refill  . atorvastatin (LIPITOR) 40 MG tablet Take 1  tablet (40 mg total) by mouth daily. Please schedule appointment for refills. 90 tablet 1  . clopidogrel (PLAVIX) 75 MG tablet Take 1 tablet (75 mg total) by mouth daily. 90 tablet 2  . ferrous sulfate 325 (65 FE) MG EC tablet Take 1 tablet (325 mg total) by mouth daily with breakfast. 30 tablet 0  . losartan (COZAAR) 100 MG tablet Take 1 tablet (100 mg total) by mouth daily. 30 tablet 0  . Omega-3 Fatty Acids (FISH OIL) 1200 MG CAPS Take 1,200 mg by mouth daily.    . pantoprazole (PROTONIX) 40 MG tablet Take 1 tablet (40 mg total) by mouth 2 (two) times daily. 60 tablet 0   No current facility-administered medications for this visit.     Allergies:   Patient has no known allergies.    Social History:  The patient  reports that she quit smoking about 5 years ago. Her smoking use included Cigarettes. She has a 50.00 pack-year smoking history. She has never used smokeless tobacco. She reports that she drinks about 3.6 oz of alcohol per week . She reports that she does not use drugs.   Family History:  The patient's family history includes Cancer (age of onset: 5) in her mother; Deep vein thrombosis in her daughter; Heart attack in her father and maternal grandmother; Heart attack (age of onset: 79) in her maternal grandfather; Heart disease in her sister; Parkinson's disease in her sister; Stroke in her paternal grandfather.    ROS:  Please see the history of present illness. All other systems are reviewed and negative.    PHYSICAL EXAM: VS:  BP 132/70 (BP Location: Left Arm, Patient Position: Sitting, Cuff Size: Normal)   Pulse 74   Ht 5\' 7"  (1.702 m)   Wt 147 lb 6.4 oz (66.9 kg)   BMI 23.09 kg/m  , BMI Body mass index is 23.09 kg/m. GEN: Well nourished, well developed, female in no acute distress  HEENT: normal for age  Neck: no JVD, bilateral carotid bruits, no masses Cardiac: RRR; no murmur, no rubs, or gallops Respiratory: Few scattered rales bilaterally, normal work of  breathing GI: soft, nontender, nondistended, + BS Sara: no deformity or atrophy; no edema; distal pulses are 2+ in upper extremities; decreased DP/PT pulses, R more than L, cap refill borderline long on R, ok on L  Skin: warm and dry, no rash Neuro:  Strength and sensation are intact Psych: euthymic mood, full affect   EKG:  EKG is not ordered today.  MYOVIEW: 01/10/2016  The left ventricular ejection fraction is normal (55-65%).  Nuclear stress EF: 65%.  The study is normal.  This is a low risk study.  Normal perfusion with minimal soft tissue attenuation (breast)  Recent Labs: 06/07/2016: ALT 12; BUN 11; Creatinine, Ser 0.81; Hemoglobin 9.1; Platelets 286; Potassium 4.1; Sodium 139    Lipid Panel    Component Value Date/Time   CHOL 128 05/07/2013 0934   TRIG 71 05/07/2013  0934   HDL 57 05/07/2013 0934   CHOLHDL 2.2 05/07/2013 0934   VLDL 14 05/07/2013 0934   LDLCALC 57 05/07/2013 0934     Wt Readings from Last 3 Encounters:  07/03/16 147 lb 6.4 oz (66.9 kg)  06/06/16 149 lb (67.6 kg)  01/10/16 151 lb (68.5 kg)     Other studies Reviewed: Additional studies/ records that were reviewed today include: office notes, hospital records and testing.  ASSESSMENT AND PLAN:  1.  PAD: She is having some right thigh discomfort. ABIs were good when last checked in November 2017. Discuss with Dr. [name] if she should have them rechecked now or wait until they are due in November 2018. She is encouraged to continue low-cholesterol diet. She is encouraged to continue to abstain from smoking. Continue Plavix  2. CAD: She is on good medical therapy with Plavix, a statin, but no aspirin secondary to her recent GI bleed. Discuss with Dr. [name] if she should continue on Plavix alone or if we should try to restart the aspirin as well. She is not on a beta blocker but with her smoking history, she is at high risk for COPD. Discuss with Dr. [name] if we should try to start a low-dose beta  blocker.  3. Carotid artery disease: She has no symptoms indicating this disease has progressed. Follow up as scheduled.   Current medicines are reviewed at length with the patient today.  The patient does not have concerns regarding medicines.  The following changes have been made:  no change  Labs/ tests ordered today include:  No orders of the defined types were placed in this encounter.    Disposition:   FU with Dr. [name]  Signed, Yeriel Mineo, Deneen Harts  07/03/2016 2:48 PM    [facility] Phone: [phone]; Fax: [phone]  This note was written with the assistance of speech recognition software. Please excuse any transcriptional errors.

## 2016-07-16 NOTE — Addendum Note (Signed)
Addendum  created 07/16/16 1349 by Keeara Frees, MD   Sign clinical note    

## 2016-07-18 ENCOUNTER — Other Ambulatory Visit: Payer: Self-pay | Admitting: Cardiovascular Disease

## 2016-07-19 ENCOUNTER — Other Ambulatory Visit: Payer: Self-pay | Admitting: Cardiovascular Disease

## 2016-07-19 DIAGNOSIS — S86112D Strain of other muscle(s) and tendon(s) of posterior muscle group at lower leg level, left leg, subsequent encounter: Secondary | ICD-10-CM | POA: Diagnosis not present

## 2016-07-19 DIAGNOSIS — I739 Peripheral vascular disease, unspecified: Secondary | ICD-10-CM

## 2016-07-25 ENCOUNTER — Ambulatory Visit (HOSPITAL_COMMUNITY)
Admission: RE | Admit: 2016-07-25 | Discharge: 2016-07-25 | Disposition: A | Discharge status: Home / Self Care | Payer: PPO | Source: Ambulatory Visit | Attending: Cardiovascular Disease | Admitting: Cardiovascular Disease

## 2016-07-25 DIAGNOSIS — K259 Gastric ulcer, unspecified as acute or chronic, without hemorrhage or perforation: Secondary | ICD-10-CM | POA: Diagnosis not present

## 2016-07-25 DIAGNOSIS — R9439 Abnormal result of other cardiovascular function study: Secondary | ICD-10-CM | POA: Diagnosis not present

## 2016-07-25 DIAGNOSIS — I739 Peripheral vascular disease, unspecified: Secondary | ICD-10-CM | POA: Insufficient documentation

## 2016-07-25 DIAGNOSIS — I779 Disorder of arteries and arterioles, unspecified: Secondary | ICD-10-CM | POA: Diagnosis not present

## 2016-07-25 DIAGNOSIS — D649 Anemia, unspecified: Secondary | ICD-10-CM | POA: Diagnosis not present

## 2016-07-30 ENCOUNTER — Encounter (HOSPITAL_COMMUNITY): Payer: Self-pay

## 2016-07-30 ENCOUNTER — Other Ambulatory Visit: Payer: Self-pay | Admitting: Cardiovascular Disease

## 2016-07-30 DIAGNOSIS — I739 Peripheral vascular disease, unspecified: Secondary | ICD-10-CM

## 2016-07-30 NOTE — Progress Notes (Signed)
Today's right lower extremity venous duplex is negative for DVT. Preliminary results given to Lisa. 

## 2016-08-02 DIAGNOSIS — K250 Acute gastric ulcer with hemorrhage: Secondary | ICD-10-CM | POA: Diagnosis not present

## 2016-08-02 DIAGNOSIS — D500 Iron deficiency anemia secondary to blood loss (chronic): Secondary | ICD-10-CM | POA: Diagnosis not present

## 2016-08-02 DIAGNOSIS — K219 Gastro-esophageal reflux disease without esophagitis: Secondary | ICD-10-CM | POA: Diagnosis not present

## 2016-08-27 DIAGNOSIS — E7800 Pure hypercholesterolemia, unspecified: Secondary | ICD-10-CM | POA: Diagnosis not present

## 2016-08-27 DIAGNOSIS — M859 Disorder of bone density and structure, unspecified: Secondary | ICD-10-CM | POA: Diagnosis not present

## 2016-08-27 DIAGNOSIS — I739 Peripheral vascular disease, unspecified: Secondary | ICD-10-CM | POA: Diagnosis not present

## 2016-08-27 DIAGNOSIS — K259 Gastric ulcer, unspecified as acute or chronic, without hemorrhage or perforation: Secondary | ICD-10-CM | POA: Diagnosis not present

## 2016-08-27 DIAGNOSIS — I10 Essential (primary) hypertension: Secondary | ICD-10-CM | POA: Diagnosis not present

## 2016-08-27 DIAGNOSIS — I2510 Atherosclerotic heart disease of native coronary artery without angina pectoris: Secondary | ICD-10-CM | POA: Diagnosis not present

## 2016-08-27 DIAGNOSIS — Z0000 Encounter for general adult medical examination without abnormal findings: Secondary | ICD-10-CM | POA: Diagnosis not present

## 2016-08-27 DIAGNOSIS — M5030 Other cervical disc degeneration, unspecified cervical region: Secondary | ICD-10-CM | POA: Diagnosis not present

## 2016-08-27 DIAGNOSIS — Z87891 Personal history of nicotine dependence: Secondary | ICD-10-CM | POA: Diagnosis not present

## 2016-08-27 DIAGNOSIS — M85852 Other specified disorders of bone density and structure, left thigh: Secondary | ICD-10-CM | POA: Diagnosis not present

## 2016-08-28 ENCOUNTER — Other Ambulatory Visit: Payer: Self-pay | Admitting: Acute Care

## 2016-08-28 DIAGNOSIS — K250 Acute gastric ulcer with hemorrhage: Secondary | ICD-10-CM | POA: Diagnosis not present

## 2016-08-28 DIAGNOSIS — Z87891 Personal history of nicotine dependence: Secondary | ICD-10-CM

## 2016-08-28 DIAGNOSIS — K222 Esophageal obstruction: Secondary | ICD-10-CM | POA: Diagnosis not present

## 2016-08-28 DIAGNOSIS — K3189 Other diseases of stomach and duodenum: Secondary | ICD-10-CM | POA: Diagnosis not present

## 2016-10-23 ENCOUNTER — Other Ambulatory Visit: Payer: Self-pay | Admitting: Cardiovascular Disease

## 2016-10-23 NOTE — Telephone Encounter (Signed)
REFILL 

## 2016-10-30 ENCOUNTER — Telehealth: Payer: Self-pay | Admitting: Cardiovascular Disease

## 2016-10-30 ENCOUNTER — Other Ambulatory Visit: Payer: Self-pay | Admitting: *Deleted

## 2016-10-30 NOTE — Telephone Encounter (Signed)
New message      *STAT* If patient is at the pharmacy, call can be transferred to refill team.   1. Which medications need to be refilled? (please list name of each medication and dose if known)  plavix   2. Which pharmacy/location (including street and city if local pharmacy) is medication to be sent to? [facility]   3. Do they need a 30 day or 90 day supply?  2 week prescription mail order has not been sent yet

## 2016-10-31 MED ORDER — CLOPIDOGREL BISULFATE 75 MG PO TABS: 75.0000 mg | ORAL_TABLET | Freq: Every day | ORAL | 0 refills | Status: DC

## 2016-10-31 NOTE — Telephone Encounter (Signed)
S/w pt states that she is out of Plavix and cannot get a refill until next week she will be out of town and not be able, rx was sent [facility].. Rx sent to local pharmacy

## 2016-11-28 ENCOUNTER — Ambulatory Visit (INDEPENDENT_AMBULATORY_CARE_PROVIDER_SITE_OTHER)
Admission: RE | Admit: 2016-11-28 | Discharge: 2016-11-28 | Disposition: A | Discharge status: Home / Self Care | Payer: PPO | Source: Ambulatory Visit | Attending: Acute Care | Admitting: Acute Care

## 2016-11-28 ENCOUNTER — Ambulatory Visit (INDEPENDENT_AMBULATORY_CARE_PROVIDER_SITE_OTHER): Payer: PPO | Admitting: Acute Care

## 2016-11-28 ENCOUNTER — Encounter: Payer: Self-pay | Admitting: Acute Care

## 2016-11-28 DIAGNOSIS — F17210 Nicotine dependence, cigarettes, uncomplicated: Secondary | ICD-10-CM | POA: Diagnosis not present

## 2016-11-28 DIAGNOSIS — Z87891 Personal history of nicotine dependence: Secondary | ICD-10-CM

## 2016-11-28 NOTE — Progress Notes (Signed)
Shared Decision Making Visit Lung Cancer Screening Program [phone])   Eligibility:  Age 76 y.o.  Pack Years Smoking History Calculation 46-pack-year smoking history (# packs/per year x # years smoked)  Recent History of coughing up blood  no  Unexplained weight loss? no ( >Than 15 pounds within the last 6 months )  Prior History Lung / other cancer no (Diagnosis within the last 5 years already requiring surveillance chest CT Scans).  Smoking Status Current Smoker  Former Smokers: Years since quit: NA  Quit Date: NA  Visit Components:  Discussion included one or more decision making aids. yes  Discussion included risk/benefits of screening. yes  Discussion included potential follow up diagnostic testing for abnormal scans. yes  Discussion included meaning and risk of over diagnosis. yes  Discussion included meaning and risk of False Positives. yes  Discussion included meaning of total radiation exposure. yes  Counseling Included:  Importance of adherence to annual lung cancer LDCT screening. yes  Impact of comorbidities on ability to participate in the program. yes  Ability and willingness to under diagnostic treatment. yes  Smoking Cessation Counseling:  Current Smokers:   Discussed importance of smoking cessation. yes  Information about tobacco cessation classes and interventions provided to patient. yes  Patient provided with "ticket" for LDCT Scan. yes  Symptomatic Patient. no  CounselingNA  Diagnosis Code: Tobacco Use Z72.0  Asymptomatic Patient yes  Counseling (Intermediate counseling: > three minutes counseling) O9629  Former Smokers:   Discussed the importance of maintaining cigarette abstinence. yes  Diagnosis Code: Personal History of Nicotine Dependence. B28.413  Information about tobacco cessation classes and interventions provided to patient. Yes  Patient provided with "ticket" for LDCT Scan. yes  Written Order for Lung Cancer  Screening with LDCT placed in Epic. Yes (CT Chest Lung Cancer Screening Low Dose W/O CM) [id number] Z12.2-Screening of respiratory organs Z87.891-Personal history of nicotine dependence  I have spent 25 minutes of face to face time with Mrs. Castronovo discussing the risks and benefits of lung cancer screening. We viewed a power point together that explained in detail the above noted topics. We paused at intervals to allow for questions to be asked and answered to ensure understanding.We discussed that the single most powerful action that she can take to decrease her risk of developing lung cancer is to quit smoking. We discussed whether or not she is ready to commit to setting a quit date. She is currently not ready to set a quit date, however she is working on it. We discussed options for tools to aid in quitting smoking including nicotine replacement therapy, non-nicotine medications, support groups, Quit Smart classes, and behavior modification. We discussed that often times setting smaller, more achievable goals, such as eliminating 1 cigarette a day for a week and then 2 cigarettes a day for a week can be helpful in slowly decreasing the number of cigarettes smoked. This allows for a sense of accomplishment as well as providing a clinical benefit. I gave her the " Be Stronger Than Your Excuses" card with contact information for community resources, classes, free nicotine replacement therapy, and access to mobile apps, text messaging, and on-line smoking cessation help. I have also given her my card and contact information in the event she needs to contact me. We discussed the time and location of the scan, and that either Abigail Miyamoto RN or I will call with the results within 24-48 hours of receiving them. I have offered her  a copy of the power  point we viewed  as a resource in the event they need reinforcement of the concepts we discussed today in the office. The patient verbalized understanding of all of   the above and had no further questions upon leaving the office. They have my contact information in the event they have any further questions.  I spent 4 minutes counseling on smoking cessation and the health risks of continued tobacco abuse.  I explained to the patient that there has been a high incidence of coronary artery disease noted on these exams. I explained that this is a non-gated exam therefore degree or severity cannot be determined. This patient is currently on statin therapy. I have asked the patient to follow-up with their PCP regarding any incidental finding of coronary artery disease and management with diet or medication as their PCP  feels is clinically indicated. The patient verbalized understanding of the above and had no further questions upon completion of the visit.      Bevelyn NgoSarah F Groce, NP 11/28/2016

## 2016-12-03 ENCOUNTER — Telehealth: Payer: Self-pay | Admitting: Acute Care

## 2016-12-03 NOTE — Telephone Encounter (Signed)
Will route to Denise 

## 2016-12-06 ENCOUNTER — Other Ambulatory Visit: Payer: Self-pay | Admitting: Acute Care

## 2016-12-06 ENCOUNTER — Other Ambulatory Visit: Payer: Self-pay | Admitting: Cardiovascular Disease

## 2016-12-06 DIAGNOSIS — F17210 Nicotine dependence, cigarettes, uncomplicated: Principal | ICD-10-CM

## 2016-12-06 DIAGNOSIS — Z122 Encounter for screening for malignant neoplasm of respiratory organs: Secondary | ICD-10-CM

## 2016-12-06 DIAGNOSIS — I739 Peripheral vascular disease, unspecified: Principal | ICD-10-CM

## 2016-12-06 DIAGNOSIS — I779 Disorder of arteries and arterioles, unspecified: Secondary | ICD-10-CM

## 2016-12-06 NOTE — Telephone Encounter (Signed)
Pt informed of CT results per Kandice RobinsonsSarah Groce, NP.  PT verbalized understanding.  Copy sent to PCP.  Order placed for 1 yr f/u CT.  Pt was offered pulmonary consult to assess enlarged pulmonary arteries.  Pt states she is not having any symptoms at this time and will call for appt if needed.  Pt was also advised to f/u with PCP for physical breast exam and comparison of recent mammogram. Pt verbalized understanding.

## 2016-12-06 NOTE — Telephone Encounter (Signed)
LMTC x 1  

## 2016-12-11 DIAGNOSIS — R9389 Abnormal findings on diagnostic imaging of other specified body structures: Secondary | ICD-10-CM | POA: Diagnosis not present

## 2016-12-11 DIAGNOSIS — R079 Chest pain, unspecified: Secondary | ICD-10-CM | POA: Diagnosis not present

## 2016-12-26 ENCOUNTER — Ambulatory Visit (HOSPITAL_COMMUNITY)
Admission: RE | Admit: 2016-12-26 | Discharge: 2016-12-26 | Disposition: A | Discharge status: Home / Self Care | Payer: PPO | Source: Ambulatory Visit | Attending: Cardiovascular Disease | Admitting: Cardiovascular Disease

## 2016-12-26 DIAGNOSIS — I2510 Atherosclerotic heart disease of native coronary artery without angina pectoris: Secondary | ICD-10-CM | POA: Diagnosis not present

## 2016-12-26 DIAGNOSIS — E785 Hyperlipidemia, unspecified: Secondary | ICD-10-CM | POA: Insufficient documentation

## 2016-12-26 DIAGNOSIS — I779 Disorder of arteries and arterioles, unspecified: Secondary | ICD-10-CM

## 2016-12-26 DIAGNOSIS — I739 Peripheral vascular disease, unspecified: Secondary | ICD-10-CM

## 2016-12-26 DIAGNOSIS — I6523 Occlusion and stenosis of bilateral carotid arteries: Secondary | ICD-10-CM | POA: Insufficient documentation

## 2016-12-26 DIAGNOSIS — F17200 Nicotine dependence, unspecified, uncomplicated: Secondary | ICD-10-CM | POA: Diagnosis not present

## 2016-12-26 DIAGNOSIS — I10 Essential (primary) hypertension: Secondary | ICD-10-CM | POA: Insufficient documentation

## 2016-12-28 ENCOUNTER — Ambulatory Visit: Payer: PPO | Admitting: Cardiovascular Disease

## 2017-01-08 ENCOUNTER — Encounter: Payer: Self-pay | Admitting: Cardiovascular Disease

## 2017-01-08 ENCOUNTER — Ambulatory Visit: Payer: PPO | Admitting: Cardiovascular Disease

## 2017-01-08 VITALS — BP 130/64 | HR 82 | Ht 67.0 in | Wt 154.4 lb

## 2017-01-08 DIAGNOSIS — I739 Peripheral vascular disease, unspecified: Secondary | ICD-10-CM

## 2017-01-08 DIAGNOSIS — I10 Essential (primary) hypertension: Secondary | ICD-10-CM | POA: Diagnosis not present

## 2017-01-08 DIAGNOSIS — I2510 Atherosclerotic heart disease of native coronary artery without angina pectoris: Secondary | ICD-10-CM | POA: Diagnosis not present

## 2017-01-08 DIAGNOSIS — I6523 Occlusion and stenosis of bilateral carotid arteries: Secondary | ICD-10-CM

## 2017-01-08 DIAGNOSIS — E785 Hyperlipidemia, unspecified: Secondary | ICD-10-CM

## 2017-01-08 NOTE — Assessment & Plan Note (Signed)
History of pe status post right SFA intervention in 2006 with re-intervention 07/05/11 and again most recently 9/22/16with a small Viabahn covered stent at the leading edge of a previously placed stent. She denies claudication. Her most recent Dopplers performed 07/25/16 revealed a right ABI 0.97 with a widely patent stent. We'll continue to follow her by duplex ultrasound on an annual basis

## 2017-01-08 NOTE — Assessment & Plan Note (Signed)
History of essential hypertension with blood pressure measured today at 130/64. She is on losartan. Continue current meds at current dosing.

## 2017-01-08 NOTE — Patient Instructions (Signed)
Medication Instructions: Your physician recommends that you continue on your current medications as directed. Please refer to the Current Medication list given to you today.   Labwork: I will request recent blood work from Dr. Rondel BatonMiller's office.  Testing/Procedures: Your physician has requested that you have a carotid duplex. This test is an ultrasound of the carotid arteries in your neck. It looks at blood flow through these arteries that supply the brain with blood. Allow one hour for this exam. There are no restrictions or special instructions.  Your physician has requested that you have a lower extremity arterial duplex. During this test, ultrasound is used to evaluate arterial blood flow in the legs. Allow one hour for this exam. There are no restrictions or special instructions.  Your physician has requested that you have an ankle brachial index (ABI). During this test an ultrasound and blood pressure cuff are used to evaluate the arteries that supply the arms and legs with blood. Allow thirty minutes for this exam. There are no restrictions or special instructions.  (Please schedule above tests for June 2019)   Follow-Up: Your physician wants you to follow-up in: 1 year with Dr. Allyson SabalBerry. You will receive a reminder letter in the mail two months in advance. If you don't receive a letter, please call our office to schedule the follow-up appointment.  If you need a refill on your cardiac medications before your next appointment, please call your pharmacy.

## 2017-01-08 NOTE — Assessment & Plan Note (Signed)
History of CAD status post LAD stenting by myself in 2006. I catheterized her 08/05/09 revealed a patent LAD stent, chronically occluded OM branch with a high-grade mid RCA lesion which I stented with a Taxus ion drug-eluting stent. She had 2 drug-eluting stents placed in the RCA by Dr. York Gricerulock radially at [facility] July 2016. She denies chest pain or shortness of breath.

## 2017-01-08 NOTE — Assessment & Plan Note (Signed)
History of hyperlipidemia on statin therapy followed by her PCP. 

## 2017-01-08 NOTE — Progress Notes (Signed)
01/08/2017 Jennilee Demarco [facility]   1940-03-31  [id number]  Primary Physician [name], MD Primary Cardiologist: Runell Gess MD FACP, [place], [place], [place]  HPI:  Sara Dominguez is a 76 y.o.  thin-appearing, married Caucasian female, mother of 2, grandmother to 3 grandchildren who I last saw in the office 12/29/15. She has a history of CAD status post LAD stenting by myself in 2006, as well as right SFA stenting several months thereafter. She just stopped smoking in July of last year. I catheterized her August 05, 2009, revealing a patent LAD stent and a chronically occluded OM branch with high-grade mid RCA lesion which I stented with a Taxus Ion drug-eluting stent. At that time, I demonstrated a small abdominal aortic aneurysm measuring 2.3 x 2.4 cm. In addition, she developed recurrent lower extremity pain with Dopplers that showed a decrease in the right ABI from 0.99 to 0.75 with a high frequency signal in her mid right SFA. I angiogram'd her Jul 05, 2011, revealing proximal right SFA lesion along with in-stent restenosis. I revascularized her percutaneously which resulted in marked improvement in her ability to ambulate and in her Dopplers. She did develop arm pain while at [facility] in July of last year and was cath'd at [facility] by Dr. Ellis Parents radially who placed 2 drug-eluting stents in the right coronary artery.  Because of recurrent claudication and abnormal Doppler studies I re-angiogramed to her 05/14/13 revealing high-grade disease in the mid right SFA between 2 previously placed stents. I restarted her with a 6 mm x 100 mm long Viabahn covered stent with excellent angiographic and clinical result. Her follow-up lower extremity arterial Doppler studies performed 12/04/13 revealed ABIs of 1 bilaterally with a widely patent stent. Since I saw her last she is complaining of some right lateral thigh pain and tingling in her right foot as well as dysesthesia in both upper  extremities. She does have a palpable pedal pulse on the right side. I performed a lower extremity ultrasound on her 09/27/14 revealing a high-frequency signal in the mid right SFA with a decline in her right ABI from 1.0 -.79. I angiogramed her 11/04/14 revealing a 95% stenosis of the leading edge of the previously placed covered stent which I restented using a 6 mm x 25 mm long Viabahn covered stent recent lower extremity arterial Doppler studies performed 07/25/16 revealed widely patent stents in her right SFA with ABIs of approximately 1 bilaterally. She denies claudication. In addition, she denies chest pain or shortness of breath. She did have endoscopy performed back in April because of anemia and a positive occult fecal blood test and this demonstrated a gastric ulcer. Her antiplatelet locations where held for a short period of time.     Current Meds  Medication Sig  . atorvastatin (LIPITOR) 40 MG tablet Take 1 tablet (40 mg total) by mouth daily. Please schedule appointment for refills.  . clopidogrel (PLAVIX) 75 MG tablet Take 1 tablet (75 mg total) by mouth daily.  Marland Kitchen losartan (COZAAR) 100 MG tablet Take 1 tablet (100 mg total) by mouth daily.  . Omega-3 Fatty Acids (FISH OIL) 1200 MG CAPS Take 1,200 mg by mouth daily.  . pantoprazole (PROTONIX) 40 MG tablet Take 1 tablet (40 mg total) by mouth 2 (two) times daily. (Patient taking differently: Take 40 mg by mouth daily. )     No Known Allergies  Social History   Socioeconomic History  . Marital status: Married  Spouse name: Not on file  . Number of children: Not on file  . Years of education: Not on file  . Highest education level: Not on file  Social Needs  . Financial resource strain: Not on file  . Food insecurity - worry: Not on file  . Food insecurity - inability: Not on file  . Transportation needs - medical: Not on file  . Transportation needs - non-medical: Not on file  Occupational History  . Not on file  Tobacco Use   . Smoking status: Current Every Day Smoker    Packs/day: 1.00    Years: 50.00    Pack years: 50.00    Types: Cigarettes    Last attempt to quit: 07/14/2010    Years since quitting: 6.4  . Smokeless tobacco: Never Used  . Tobacco comment: Resumed smoking 8 months ago, smokes approximately 7 cigarettes daily.. Counseling given  Substance and Sexual Activity  . Alcohol use: Yes    Alcohol/week: 3.6 oz    Types: 6 Glasses of wine per week    Comment: 05/14/2013  "socially; 2-3 glasses of wine twice/wk"  . Drug use: No  . Sexual activity: Not Currently  Other Topics Concern  . Not on file  Social History Narrative  . Not on file     Review of Systems: General: negative for chills, fever, night sweats or weight changes.  Cardiovascular: negative for chest pain, dyspnea on exertion, edema, orthopnea, palpitations, paroxysmal nocturnal dyspnea or shortness of breath Dermatological: negative for rash Respiratory: negative for cough or wheezing Urologic: negative for hematuria Abdominal: negative for nausea, vomiting, diarrhea, bright red blood per rectum, melena, or hematemesis Neurologic: negative for visual changes, syncope, or dizziness All other systems reviewed and are otherwise negative except as noted above.    Blood pressure 130/64, pulse 82, height 5\' 7"  (1.702 m), weight 154 lb 6.4 oz (70 kg).  General appearance: alert and no distress Neck: no adenopathy, no carotid bruit, no JVD, supple, symmetrical, trachea midline and thyroid not enlarged, symmetric, no tenderness/mass/nodules Lungs: clear to auscultation bilaterally Heart: regular rate and rhythm, S1, S2 normal, no murmur, click, rub or gallop Extremities: extremities normal, atraumatic, no cyanosis or edema Pulses: 2+ and symmetric Skin: Skin color, texture, turgor normal. No rashes or lesions Neurologic: Alert and oriented X 3, normal strength and tone. Normal symmetric reflexes. Normal coordination and gait  EKG NSR  at 82  ASSESSMENT AND PLAN:   PAD Rt SFA stent 2006 with ISR 5/13. New RSFA stent 05/14/13 History of pe status post right SFA intervention in 2006 with re-intervention 07/05/11 and again most recently 9/22/16with a small Viabahn covered stent at the leading edge of a previously placed stent. She denies claudication. Her most recent Dopplers performed 07/25/16 revealed a right ABI 0.97 with a widely patent stent. We'll continue to follow her by duplex ultrasound on an annual basis  CAD- LAD stent in 2006, chronically occluded OM branch with hx. of Stent to RCA  History of CAD status post LAD stenting by myself in 2006. I catheterized her 08/05/09 revealed a patent LAD stent, chronically occluded OM branch with a high-grade mid RCA lesion which I stented with a Taxus ion drug-eluting stent. She had 2 drug-eluting stents placed in the RCA by Dr. York Gricerulock radially at [facility] July 2016. She denies chest pain or shortness of breath.  Hyperlipidemia with target LDL less than 70 History of hyperlipidemia on statin therapy followed by her PCP  Essential hypertension  History of essential hypertension with blood pressure measured today at 130/64. She is on losartan. Continue current meds at current dosing.      Runell GessJonathan J. Kathaleya Mcduffee MD FACP,FACC,FAHA, [facility] 01/08/2017 9:28 AM

## 2017-04-02 ENCOUNTER — Other Ambulatory Visit: Payer: Self-pay | Admitting: Cardiovascular Disease

## 2017-04-02 ENCOUNTER — Other Ambulatory Visit: Payer: Self-pay | Admitting: Family Medicine

## 2017-04-02 DIAGNOSIS — Z1231 Encounter for screening mammogram for malignant neoplasm of breast: Secondary | ICD-10-CM

## 2017-04-02 MED ORDER — CLOPIDOGREL BISULFATE 75 MG PO TABS: 75.0000 mg | ORAL_TABLET | Freq: Every day | ORAL | 2 refills | Status: DC

## 2017-04-02 NOTE — Telephone Encounter (Signed)
New Message    Please send new prescription to [facility] due to insurance change she has to use them now   *STAT* If patient is at the pharmacy, call can be transferred to refill team.   1. Which medications need to be refilled? (please list name of each medication and dose if known) Plavix   2. Which pharmacy/location (including street and city if local pharmacy) is medication to be sent to? [facility]   3. Do they need a 30 day or 90 day supply?  90

## 2017-04-02 NOTE — Telephone Encounter (Signed)
Rx(s) sent to pharmacy electronically.  

## 2017-04-04 ENCOUNTER — Other Ambulatory Visit: Payer: Self-pay | Admitting: *Deleted

## 2017-04-04 MED ORDER — CLOPIDOGREL BISULFATE 75 MG PO TABS: 75.0000 mg | ORAL_TABLET | Freq: Every day | ORAL | 3 refills | Status: DC

## 2017-04-19 ENCOUNTER — Ambulatory Visit
Admission: RE | Admit: 2017-04-19 | Discharge: 2017-04-19 | Disposition: A | Discharge status: Home / Self Care | Payer: Medicare Other | Source: Ambulatory Visit | Attending: Family Medicine | Admitting: Family Medicine

## 2017-04-19 DIAGNOSIS — Z1231 Encounter for screening mammogram for malignant neoplasm of breast: Secondary | ICD-10-CM

## 2017-06-26 ENCOUNTER — Telehealth: Payer: Self-pay | Admitting: Cardiovascular Disease

## 2017-06-26 NOTE — Telephone Encounter (Signed)
Returned the call to the patient. She stated that for the last 3-4 weeks she has been having increased pain in both of her legs from the calf down. The pain gets worse when she ambulates and is relieved when she rests. She has also been having problems with edema bilaterally on her feet and ankles. This gets worse throughout the day but does get better by morning. The patient stated that she has mild shortness of breath on exertion.   She recently started glucosamine around the same time that the symptoms started and wonders if this could be related. Message routed to the provider.

## 2017-06-26 NOTE — Telephone Encounter (Addendum)
New message  Patient declined next available with Dr [name] and APP    Pt c/o swelling: STAT is pt has developed SOB within 24 hours  1) How much weight have you gained and in what time span? N/A  2) If swelling, where is the swelling located?  ANKLES, FEET  3) Are you currently taking a fluid pill? NO  4) Are you currently SOB? A LITTLE  5) Do you have a log of your daily weights (if so, list)? NO  6) Have you gained 3 pounds in a day or 5 pounds in a week? N/A  7) Have you traveled recently? NO

## 2017-06-27 NOTE — Telephone Encounter (Signed)
Needs LEAs then ROV

## 2017-06-28 ENCOUNTER — Other Ambulatory Visit: Payer: Self-pay | Admitting: Cardiovascular Disease

## 2017-06-28 DIAGNOSIS — I739 Peripheral vascular disease, unspecified: Secondary | ICD-10-CM

## 2017-06-28 NOTE — Telephone Encounter (Signed)
Orders entered. Message sent to scheduling to arrange.

## 2017-06-28 NOTE — Telephone Encounter (Signed)
Informed pt who was agreeable to this. Awaiting call from schedulers.

## 2017-07-03 ENCOUNTER — Inpatient Hospital Stay (HOSPITAL_COMMUNITY)
Admission: EM | Admit: 2017-07-03 | Discharge: 2017-07-05 | DRG: 812 | Disposition: A | Discharge status: Home / Self Care | Payer: Medicare Other | Attending: Internal Medicine | Admitting: Internal Medicine

## 2017-07-03 ENCOUNTER — Encounter (HOSPITAL_COMMUNITY): Payer: Self-pay | Admitting: *Deleted

## 2017-07-03 ENCOUNTER — Other Ambulatory Visit: Payer: Self-pay

## 2017-07-03 DIAGNOSIS — D62 Acute posthemorrhagic anemia: Principal | ICD-10-CM | POA: Diagnosis present

## 2017-07-03 DIAGNOSIS — I714 Abdominal aortic aneurysm, without rupture, unspecified: Secondary | ICD-10-CM | POA: Diagnosis present

## 2017-07-03 DIAGNOSIS — I10 Essential (primary) hypertension: Secondary | ICD-10-CM | POA: Diagnosis not present

## 2017-07-03 DIAGNOSIS — D509 Iron deficiency anemia, unspecified: Secondary | ICD-10-CM | POA: Diagnosis present

## 2017-07-03 DIAGNOSIS — Z8711 Personal history of peptic ulcer disease: Secondary | ICD-10-CM

## 2017-07-03 DIAGNOSIS — Z7902 Long term (current) use of antithrombotics/antiplatelets: Secondary | ICD-10-CM | POA: Diagnosis not present

## 2017-07-03 DIAGNOSIS — Z955 Presence of coronary angioplasty implant and graft: Secondary | ICD-10-CM

## 2017-07-03 DIAGNOSIS — Z79899 Other long term (current) drug therapy: Secondary | ICD-10-CM

## 2017-07-03 DIAGNOSIS — I739 Peripheral vascular disease, unspecified: Secondary | ICD-10-CM | POA: Diagnosis present

## 2017-07-03 DIAGNOSIS — Z7982 Long term (current) use of aspirin: Secondary | ICD-10-CM | POA: Diagnosis not present

## 2017-07-03 DIAGNOSIS — F17210 Nicotine dependence, cigarettes, uncomplicated: Secondary | ICD-10-CM | POA: Diagnosis present

## 2017-07-03 DIAGNOSIS — D649 Anemia, unspecified: Secondary | ICD-10-CM | POA: Diagnosis present

## 2017-07-03 DIAGNOSIS — I2510 Atherosclerotic heart disease of native coronary artery without angina pectoris: Secondary | ICD-10-CM | POA: Diagnosis present

## 2017-07-03 DIAGNOSIS — D500 Iron deficiency anemia secondary to blood loss (chronic): Secondary | ICD-10-CM | POA: Diagnosis not present

## 2017-07-03 DIAGNOSIS — E785 Hyperlipidemia, unspecified: Secondary | ICD-10-CM | POA: Diagnosis present

## 2017-07-03 DIAGNOSIS — I779 Disorder of arteries and arterioles, unspecified: Secondary | ICD-10-CM | POA: Diagnosis present

## 2017-07-03 DIAGNOSIS — M79609 Pain in unspecified limb: Secondary | ICD-10-CM | POA: Diagnosis not present

## 2017-07-03 DIAGNOSIS — I6523 Occlusion and stenosis of bilateral carotid arteries: Secondary | ICD-10-CM | POA: Diagnosis not present

## 2017-07-03 LAB — BASIC METABOLIC PANEL
ANION GAP: 10 (ref 5–15)
BUN: 17 mg/dL (ref 6–20)
CALCIUM: 9.3 mg/dL (ref 8.9–10.3)
CO2: 22 mmol/L (ref 22–32)
Chloride: 106 mmol/L (ref 101–111)
Creatinine, Ser: 0.81 mg/dL (ref 0.44–1.00)
GFR calc non Af Amer: >60 mL/min (ref >60)
GLUCOSE: 108 mg/dL — AB (ref 65–99)
POTASSIUM: 3.6 mmol/L (ref 3.5–5.1)
Sodium: 138 mmol/L (ref 135–145)

## 2017-07-03 LAB — CBC
HCT: 25 % — ABNORMAL LOW (ref 36.0–46.0)
HEMATOCRIT: 24.3 % — AB (ref 36.0–46.0)
HEMOGLOBIN: 7.6 g/dL — AB (ref 12.0–15.0)
HEMOGLOBIN: 7.2 g/dL — AB (ref 12.0–15.0)
MCH: 23.8 pg — AB (ref 26.0–34.0)
MCH: 23.2 pg — AB (ref 26.0–34.0)
MCHC: 30.4 g/dL (ref 30.0–36.0)
MCHC: 29.6 g/dL — ABNORMAL LOW (ref 30.0–36.0)
MCV: 78.4 fL (ref 78.0–100.0)
MCV: 78.1 fL (ref 78.0–100.0)
Platelets: 336 10*3/uL (ref 150–400)
Platelets: 304 10*3/uL (ref 150–400)
RBC: 3.19 MIL/uL — AB (ref 3.87–5.11)
RBC: 3.11 MIL/uL — AB (ref 3.87–5.11)
RDW: 16.7 % — ABNORMAL HIGH (ref 11.5–15.5)
RDW: 16.6 % — ABNORMAL HIGH (ref 11.5–15.5)
WBC: 6 10*3/uL (ref 4.0–10.5)
WBC: 7 10*3/uL (ref 4.0–10.5)

## 2017-07-03 LAB — ABO/RH: ABO/RH(D): B POS

## 2017-07-03 LAB — IRON AND TIBC
Iron: 12 ug/dL — ABNORMAL LOW (ref 28–170)
Saturation Ratios: 3 % — ABNORMAL LOW (ref 10.4–31.8)
TIBC: 428 ug/dL (ref 250–450)
UIBC: 416 ug/dL

## 2017-07-03 LAB — PREPARE RBC (CROSSMATCH)

## 2017-07-03 LAB — MAGNESIUM: MAGNESIUM: 1.5 mg/dL — AB (ref 1.7–2.4)

## 2017-07-03 LAB — VITAMIN B12: VITAMIN B 12: 512 pg/mL (ref 180–914)

## 2017-07-03 LAB — POC OCCULT BLOOD, ED: Fecal Occult Bld: NEGATIVE

## 2017-07-03 LAB — FOLATE: Folate: 14.7 ng/mL

## 2017-07-03 LAB — FERRITIN: Ferritin: 5 ng/mL — ABNORMAL LOW (ref 11–307)

## 2017-07-03 LAB — RETICULOCYTES
RBC.: 3.22 MIL/uL — ABNORMAL LOW (ref 3.87–5.11)
Retic Count, Absolute: 45.1 10*3/uL (ref 19.0–186.0)
Retic Ct Pct: 1.4 % (ref 0.4–3.1)

## 2017-07-03 LAB — TSH: TSH: 1.163 u[IU]/mL (ref 0.350–4.500)

## 2017-07-03 MED ORDER — ASPIRIN EC 81 MG PO TBEC
81.0000 mg | DELAYED_RELEASE_TABLET | Freq: Every day | ORAL | Status: DC
  Administered 2017-07-04 – 2017-07-05 (×2): 81 mg via ORAL
  Filled 2017-07-03 (×2): qty 1

## 2017-07-03 MED ORDER — VITAMIN D3 25 MCG (1000 UNIT) PO TABS
1000.0000 [IU] | ORAL_TABLET | Freq: Every day | ORAL | Status: DC
  Administered 2017-07-04 – 2017-07-05 (×2): 1000 [IU] via ORAL
  Filled 2017-07-03 (×2): qty 1

## 2017-07-03 MED ORDER — MAGNESIUM SULFATE 2 GM/50ML IV SOLN
2.0000 g | Freq: Once | INTRAVENOUS | Status: AC
  Administered 2017-07-03: 2 g via INTRAVENOUS
  Filled 2017-07-03: qty 50

## 2017-07-03 MED ORDER — ATORVASTATIN CALCIUM 40 MG PO TABS: 40.0000 mg | ORAL_TABLET | Freq: Every day | ORAL | Status: DC

## 2017-07-03 MED ORDER — CLOPIDOGREL BISULFATE 75 MG PO TABS
75.0000 mg | ORAL_TABLET | Freq: Every day | ORAL | Status: DC
  Administered 2017-07-04 – 2017-07-05 (×2): 75 mg via ORAL
  Filled 2017-07-03 (×2): qty 1

## 2017-07-03 MED ORDER — PANTOPRAZOLE SODIUM 40 MG PO TBEC
40.0000 mg | DELAYED_RELEASE_TABLET | Freq: Two times a day (BID) | ORAL | Status: DC
  Administered 2017-07-03 – 2017-07-05 (×4): 40 mg via ORAL
  Filled 2017-07-03 (×4): qty 1

## 2017-07-03 MED ORDER — ACETAMINOPHEN 650 MG RE SUPP: 650.0000 mg | Freq: Four times a day (QID) | RECTAL | Status: DC | PRN

## 2017-07-03 MED ORDER — ATORVASTATIN CALCIUM 40 MG PO TABS
40.0000 mg | ORAL_TABLET | Freq: Every day | ORAL | Status: DC
  Administered 2017-07-04: 40 mg via ORAL
  Filled 2017-07-03 (×2): qty 1

## 2017-07-03 MED ORDER — ACETAMINOPHEN 325 MG PO TABS
650.0000 mg | ORAL_TABLET | Freq: Four times a day (QID) | ORAL | Status: DC | PRN
  Administered 2017-07-04: 650 mg via ORAL
  Filled 2017-07-03: qty 2

## 2017-07-03 MED ORDER — SODIUM CHLORIDE 0.9 % IV SOLN: Freq: Once | INTRAVENOUS | Status: DC

## 2017-07-03 NOTE — ED Notes (Signed)
ED TO INPATIENT HANDOFF REPORT  Name/Age/Gender Sara Dominguez 77 y.o. female  Code Status    Code Status Orders  (From admission, onward)        Start     Ordered   07/03/17 1539  Full code  Continuous     07/03/17 1539    Code Status History    Date Active Date Inactive Code Status Order ID Comments User Context   06/06/2016 1735 06/07/2016 1720 Full Code [id number]  Doreatha Lew, MD ED   11/04/2014 0853 11/04/2014 2134 Full Code [id number]  Lorretta Harp, MD Inpatient   05/14/2013 1638 05/15/2013 1244 Full Code [id number]  Lorretta Harp, MD Inpatient    Advance Directive Documentation     Most Recent Value  Type of Advance Directive  Healthcare Power of Attorney, Living will  Pre-existing out of facility DNR order (yellow form or pink MOST form)  -  "MOST" Form in Place?  -      Home/SNF/Other Home  Chief Complaint blood count low  sent by dr   Level of Care/Admitting Diagnosis ED Disposition    ED Disposition Condition [place]: [place] [100102]  Level of Care: Telemetry [5]  Admit to tele based on following criteria: Complex arrhythmia (Bradycardia/Tachycardia)  Diagnosis: Anemia, iron deficiency [213310]  Admitting Physician: Reyne Dumas [[id number]]  Attending Physician: Reyne Dumas [[id number]]  Estimated length of stay: past midnight tomorrow  Certification:: I certify this patient will need inpatient services for at least 2 midnights  PT Class (Do Not Modify): Inpatient [101]  PT Acc Code (Do Not Modify): Private [1]       Medical History Past Medical History:  Diagnosis Date  . AAA (abdominal aortic aneurysm), small, history of 2.3 X 2.4 07/06/2011  . Abnormal ankle brachial index 07/06/2011  . Anxiety   . CAD (coronary artery disease), with LAD stent in 2006, chronically occluded OM branch with hx. of Stent to RCA  07/06/2011  . Claudication in peripheral vascular disease, life style limiting  07/06/2011  . Hyperlipidemia LDL goal < 70 09/08/2012  . Hypertension   . PAD (peripheral artery disease) (HCC)     Allergies No Known Allergies  IV Location/Drains/Wounds Patient Lines/Drains/Airways Status   Active Line/Drains/Airways    Name:   Placement date:   Placement time:   Site:   Days:   Peripheral IV 07/03/17 Left Antecubital   07/03/17    1608    Antecubital   less than 1          Labs/Imaging Results for orders placed or performed during the hospital encounter of 07/03/17 (from the past 48 hour(s))  Basic metabolic panel     Status: Abnormal   Collection Time: 07/03/17  1:13 PM  Result Value Ref Range   Sodium 138 135 - 145 mmol/L   Potassium 3.6 3.5 - 5.1 mmol/L   Chloride 106 101 - 111 mmol/L   CO2 22 22 - 32 mmol/L   Glucose, Bld 108 (H) 65 - 99 mg/dL   BUN 17 6 - 20 mg/dL   Creatinine, Ser 0.81 0.44 - 1.00 mg/dL   Calcium 9.3 8.9 - 10.3 mg/dL   GFR calc non Af Amer >60 >60 mL/min   GFR calc Af Amer >60 >60 mL/min    Comment: (NOTE) The eGFR has been calculated using the CKD EPI equation. This calculation has not been validated in all clinical situations. eGFR's persistently <60 mL/min  signify possible Chronic Kidney Disease.    Anion gap 10 5 - 15    Comment: Performed at [facility], [place] [address], [place], [place] [address]  CBC     Status: Abnormal   Collection Time: 07/03/17  1:13 PM  Result Value Ref Range   WBC 6.0 4.0 - 10.5 K/uL   RBC 3.19 (L) 3.87 - 5.11 MIL/uL   Hemoglobin 7.6 (L) 12.0 - 15.0 g/dL   HCT 25.0 (L) 36.0 - 46.0 %   MCV 78.4 78.0 - 100.0 fL   MCH 23.8 (L) 26.0 - 34.0 pg   MCHC 30.4 30.0 - 36.0 g/dL   RDW 16.7 (H) 11.5 - 15.5 %   Platelets 336 150 - 400 K/uL    Comment: Performed at [facility], [place] [address] [place], [place] [address]  ABO/Rh     Status: None   Collection Time: 07/03/17  1:13 PM  Result Value Ref Range   ABO/RH(D) B POS   Magnesium     Status: Abnormal    Collection Time: 07/03/17  1:13 PM  Result Value Ref Range   Magnesium 1.5 (L) 1.7 - 2.4 mg/dL    Comment: Performed at [facility], [place] [address], [place], [place] [address]  TSH     Status: None   Collection Time: 07/03/17  1:13 PM  Result Value Ref Range   TSH 1.163 0.350 - 4.500 uIU/mL    Comment: Performed by a 3rd Generation assay with a functional sensitivity of <=0.01 uIU/mL. Performed at [facility], [place] [address], [place], [place] [address]   Type and screen [place]     Status: None   Collection Time: 07/03/17  1:15 PM  Result Value Ref Range   ABO/RH(D) B POS    Antibody Screen NEG    Sample Expiration      07/06/2017 Performed at [facility], [place] [address], [place], [place] [address]   POC occult blood, ED     Status: None   Collection Time: 07/03/17  3:29 PM  Result Value Ref Range   Fecal Occult Bld NEGATIVE NEGATIVE   No results found.  Pending Labs Unresulted Labs (From admission, onward)   Start     Ordered   07/04/17 0500  Comprehensive metabolic panel  Tomorrow morning,   R     07/03/17 1539   07/03/17 2140  CBC  Every 12 hours (non-specified),   R     07/03/17 1539   07/03/17 1252  Urinalysis, Routine w reflex microscopic  Once,   STAT     07/03/17 1252      Vitals/Pain Today's Vitals   07/03/17 1246 07/03/17 1247 07/03/17 1448  BP: (!) 159/87  131/64  Pulse: 81  71  Resp: 16  18  Temp: 97.6 F (36.4 C)    TempSrc: Oral    SpO2: 100%  100%  PainSc:  0-No pain     Isolation Precautions No active isolations  Medications Medications  acetaminophen (TYLENOL) tablet 650 mg (has no administration in time range)    Or  acetaminophen (TYLENOL) suppository 650 mg (has no administration in time range)    Mobility walks

## 2017-07-03 NOTE — H&P (Addendum)
[facility] History and Physical  Sara Dominguez UJW:[id number] DOB: 1940-04-21 DOA: 07/03/2017  Referring physician: PCP: [name], MD   Chief Complaint:   HPI:  77 year old female with a history of AAA, Coronary artery disease with LAD stent in 2006, apparently has for cardiac and fall extents, carotid artery disease, dyslipidemia, hypertension, who presents to the ED because of generalized weakness in her legs as well as shortness of breath. Patient endorses neck weakness with intermittent claudication and was supposed to have a venous Doppler and an arterial Doppler to be done by her PCP. Blood work revealed hemoglobin of 7.7 and the patient was referred to the ED for further evaluation.she denies any hematochezia, melena. She takes aspirin and Plavix. She takes Protonix once a day.she denies any chest pain or chest heaviness. She is an ongoing smoker ED course BP 131/64   Pulse 71   Temp 97.6 F (36.4 C) (Oral)   Resp 18   SpO2 100%  Hemoglobin 7.6, magnesium 1.5 Fecal occult negative, patient admitted for shortness of breath, ACS rule out, claudication in her legs    EGD 06/07/16 Findings: A 3 cm hiatal hernia was present. The exam of the esophagus was otherwise normal. The stomach contained a small amount of bile, but no blood or coffee grounds. One non-bleeding cratered gastric ulcer with no stigmata of bleeding was found on the lesser curvature of the stomach and in the prepyloric region of the stomach. The lesion was 8 mm in largest dimension. Biopsies were taken with a cold forceps for histology from the antrum of the stomach. The exam of the stomach was otherwise normal. The cardia and gastric fundus were normal on retroflexion. The examined duodenum was normal.    Review of Systems: negative for the following   complete review of systems was done with pertinent positives in history of present illness     Past Medical History:  Diagnosis Date  . AAA  (abdominal aortic aneurysm), small, history of 2.3 X 2.4 07/06/2011  . Abnormal ankle brachial index 07/06/2011  . Anxiety   . CAD (coronary artery disease), with LAD stent in 2006, chronically occluded OM branch with hx. of Stent to RCA  07/06/2011  . Claudication in peripheral vascular disease, life style limiting 07/06/2011  . Hyperlipidemia LDL goal < 70 09/08/2012  . Hypertension   . PAD (peripheral artery disease) (HCC)      Past Surgical History:  Procedure Laterality Date  . ABDOMINAL AORTAGRAM N/A 07/05/2011   Procedure: ABDOMINAL Ronny Flurry;  Surgeon: Runell Gess, MD;  Location: [facility] CATH LAB;  Service: Cardiovascular;  Laterality: N/A;  . ABDOMINAL HYSTERECTOMY  1992  . CATARACT EXTRACTION W/ INTRAOCULAR LENS  IMPLANT, BILATERAL Bilateral ~ 2006  . CORONARY ANGIOPLASTY WITH STENT PLACEMENT  2006; 2009; 2013   "1 + 1 + 2";  total of 4"  . ESOPHAGOGASTRODUODENOSCOPY (EGD) WITH PROPOFOL N/A 06/07/2016   Procedure: ESOPHAGOGASTRODUODENOSCOPY (EGD) WITH PROPOFOL;  Surgeon: Bernette Redbird, MD;  Location: [facility] ENDOSCOPY;  Service: Endoscopy;  Laterality: N/A;  . FEMORAL ARTERY STENT Right 2006; 07/05/2011; 05/14/2013  . FEMORAL ARTERY STENT Right 05/14/13   between previous stens  . LOWER EXTREMITY ANGIOGRAM N/A 07/05/2011   Procedure: LOWER EXTREMITY ANGIOGRAM;  Surgeon: Runell Gess, MD;  Location: [facility] CATH LAB;  Service: Cardiovascular;  Laterality: N/A;  . LOWER EXTREMITY ANGIOGRAM N/A 05/14/2013   Procedure: LOWER EXTREMITY ANGIOGRAM;  Surgeon: Runell Gess, MD;  Location: [facility] CATH LAB;  Service: Cardiovascular;  Laterality:  N/A;  . LUMBAR DISC SURGERY  1980;; 1982  . PERCUTANEOUS STENT INTERVENTION Right 07/05/2011   Procedure: PERCUTANEOUS STENT INTERVENTION;  Surgeon: Runell Gess, MD;  Location: [facility] CATH LAB;  Service: Cardiovascular;  Laterality: Right;  . PERIPHERAL VASCULAR CATHETERIZATION N/A 11/04/2014   Procedure: Lower Extremity Angiography;  Surgeon: Runell Gess, MD;   Location: [facility] INVASIVE CV LAB;  Service: Cardiovascular;  Laterality: N/A;  . PERIPHERAL VASCULAR CATHETERIZATION Right 11/04/2014   Procedure: Peripheral Vascular Intervention;  Surgeon: Runell Gess, MD;  Location: [facility] INVASIVE CV LAB;  Service: Cardiovascular;  Laterality: Right;  SFA  . SHOULDER OPEN ROTATOR CUFF REPAIR Left ~ 2004      Social History:  reports that she has been smoking cigarettes.  She has a 50.00 pack-year smoking history. She has never used smokeless tobacco. She reports that she drinks about 3.6 oz of alcohol per week. She reports that she does not use drugs.    No Known Allergies  Family History  Problem Relation Age of Onset  . Cancer Mother 19  . Heart attack Father        died 68  . Parkinson's disease Sister   . Heart attack Maternal Grandmother   . Heart attack Maternal Grandfather 50       died at 68  . Stroke Paternal Grandfather   . Heart disease Sister   . Deep vein thrombosis Daughter        Prior to Admission medications   Medication Sig Start Date End Date Taking? Authorizing Provider  aspirin EC 81 MG tablet Take 81 mg by mouth daily.   Yes [provider]  atorvastatin (LIPITOR) 40 MG tablet Take 1 tablet (40 mg total) by mouth daily. Please schedule appointment for refills. 10/28/15  Yes Runell Gess, MD  cholecalciferol (VITAMIN D) 1000 units tablet Take 1,000 Units by mouth daily.   Yes [provider]  clopidogrel (PLAVIX) 75 MG tablet Take 1 tablet (75 mg total) by mouth daily. 04/04/17  Yes Runell Gess, MD  Glucosamine 500 MG CAPS Take 500 mg by mouth 2 (two) times daily.   Yes [provider]  losartan (COZAAR) 100 MG tablet Take 1 tablet (100 mg total) by mouth daily. 05/06/15  Yes Runell Gess, MD  Omega-3 Fatty Acids (FISH OIL) 1200 MG CAPS Take 1,200 mg by mouth daily.   Yes [provider]  pantoprazole (PROTONIX) 40 MG tablet Take 1 tablet (40 mg total) by mouth 2 (two) times  daily. Patient taking differently: Take 40 mg by mouth daily.  06/07/16  Yes Noralee Stain, DO  atorvastatin (LIPITOR) 40 MG tablet Take 1 tablet (40 mg total) by mouth daily at 6 PM. Patient not taking: Reported on 01/08/2017 07/18/16   Runell Gess, MD     Physical Exam: Vitals:   07/03/17 1246 07/03/17 1448  BP: (!) 159/87 131/64  Pulse: 81 71  Resp: 16 18  Temp: 97.6 F (36.4 C)   TempSrc: Oral   SpO2: 100% 100%        Vitals:   07/03/17 1246 07/03/17 1448  BP: (!) 159/87 131/64  Pulse: 81 71  Resp: 16 18  Temp: 97.6 F (36.4 C)   TempSrc: Oral   SpO2: 100% 100%   Constitutional: chronically ill-appearing Eyes: PERRL, lids and conjunctivae normal ENMT: Mucous membranes are moist. Posterior pharynx clear of any exudate or lesions.Normal dentition.  Neck: normal, supple, no masses, no thyromegaly Respiratory: clear to  auscultation bilaterally, no wheezing, no crackles. Normal respiratory effort. No accessory muscle use.  Cardiovascular: Regular rate and rhythm, no murmurs / rubs / gallops. No extremity edema. 2+ pedal pulses. No carotid bruits.  Abdomen: no tenderness, no masses palpated. No hepatosplenomegaly. Bowel sounds positive.  Musculoskeletal: no clubbing / cyanosis. No joint deformity upper and lower extremities. Good ROM, no contractures. Normal muscle tone.  Skin: no rashes, lesions, ulcers. No induration Neurologic: CN 2-12 grossly intact. Sensation intact, DTR normal. Strength 5/5 in all 4.  Psychiatric: Normal judgment and insight. Alert and oriented x 3. Normal mood.     Labs on Admission: I have personally reviewed following labs and imaging studies  CBC: Recent Labs  Lab 07/03/17 1313  WBC 6.0  HGB 7.6*  HCT 25.0*  MCV 78.4  PLT 336    Basic Metabolic Panel: Recent Labs  Lab 07/03/17 1313  NA 138  K 3.6  CL 106  CO2 22  GLUCOSE 108*  BUN 17  CREATININE 0.81  CALCIUM 9.3    GFR: CrCl cannot be calculated (Unknown ideal  weight.).  Liver Function Tests: No results for input(s): AST, ALT, ALKPHOS, BILITOT, PROT, ALBUMIN in the last 168 hours. No results for input(s): LIPASE, AMYLASE in the last 168 hours. No results for input(s): AMMONIA in the last 168 hours.  Coagulation Profile: No results for input(s): INR, PROTIME in the last 168 hours. No results for input(s): DDIMER in the last 72 hours.  Cardiac Enzymes: No results for input(s): CKTOTAL, CKMB, CKMBINDEX, TROPONINI in the last 168 hours.  BNP (last 3 results) No results for input(s): PROBNP in the last 8760 hours.  HbA1C: No results for input(s): HGBA1C in the last 72 hours. No results found for: HGBA1C   CBG: No results for input(s): GLUCAP in the last 168 hours.  Lipid Profile: No results for input(s): CHOL, HDL, LDLCALC, TRIG, CHOLHDL, LDLDIRECT in the last 72 hours.  Thyroid Function Tests: No results for input(s): TSH, T4TOTAL, FREET4, T3FREE, THYROIDAB in the last 72 hours.  Anemia Panel: No results for input(s): VITAMINB12, FOLATE, FERRITIN, TIBC, IRON, RETICCTPCT in the last 72 hours.  Urine analysis: No results found for: COLORURINE, APPEARANCEUR, LABSPEC, PHURINE, GLUCOSEU, HGBUR, BILIRUBINUR, KETONESUR, PROTEINUR, UROBILINOGEN, NITRITE, LEUKOCYTESUR  Sepsis Labs: (procalcitonin:4,lacticidven:4) )No results found for this or any previous visit (from the past 240 hour(s)).       Radiological Exams on Admission: No results found. No results found.    EKG: Independently reviewed.  Normal sinus rhythm  Assessment/Plan   Anemia with prior history of upper GI bleeding with an EGD in April 2018 that showed nonbleeding gastric ulcer Patient will be transfused with 2 units of packed red blood cells, discussed with Dr. [name], since she is not actively bleeding, she does not need urgent EGD, call if needed She will follow-up with Dr. [name] outpatient Continue Protonix twice a day Anemia panel   Coronary  artery disease/peripheral vascular disease She is followed by Dr. [name] She will continue aspirin and Plavix and she is not actively bleeding and she has multiple stents Will admit to telemetry, cycle cardiac enzymes,2-D echo to ensure that her EF is preserved, and shortness of breath is not related to her heart  Peripheral vascular disease with ongoing claudication will obtain ABIs and venous Doppler to rule out DVT  Hypertension-continue Cozaar  Dyslipidemia continue statin        DVT prophylaxis:  scd  Code Status History      consults called:  Family Communication: Admission, patients condition and plan of care including tests being ordered have been discussed with the patient  who indicates understanding and agree with the plan and Code Status   Admission status:  The appropriate patient status for this patient is INPATIENT. Inpatient status is judged to be reasonable and necessary in order to provide the required intensity of service to ensure the patient's safety. The patient's presenting symptoms, physical exam findings, and initial radiographic and laboratory data in the context of their chronic comorbidities is felt to place them at high risk for further clinical deterioration. Furthermore, it is not anticipated that the patient will be medically stable for discharge from the hospital within 2 midnights of admission. The following factors support the patient status of inpatient.    "           The patient's presenting symptoms include low back pain. "           The worrisome physical exam findings include and inability to walk. "           The initial radiographic and laboratory data are worrisome because of sacral fracture on CT. "           The chronic co-morbidities include history of hypertension.     * I certify that at the point of admission it is my clinical judgment that the patient will require inpatient hospital care spanning beyond 2 midnights from the point of  admission due to high intensity of service, high risk for further deterioration and high frequency of surveillance required.*    Disposition plan: Further plan will depend as patient's clinical course evolves and further radiologic and laboratory data become available. Likely home when stable    Richarda Overlie MD [facility] Pager [phone]  If 7PM-7AM, please contact night-coverage [email or web address] Password [facility]  07/03/2017, 3:35 PM

## 2017-07-03 NOTE — ED Notes (Signed)
Pt sent here by her pcp with hemoglobin of 7.7. Pt sts feeling weak, tired for a couple of weeks now; hx of GI bleed. Pt denies noticing any blood in stools this time

## 2017-07-03 NOTE — ED Notes (Signed)
HEMOOCCULT NEGATIVE

## 2017-07-03 NOTE — ED Provider Notes (Signed)
[facility]-EMERGENCY DEPT Provider Note   CSN: [id number] Arrival date & time: 07/03/17  1239     History   Chief Complaint Chief Complaint  Patient presents with  . Abnormal Lab  . Weakness    HPI Sara Dominguez is a 77 y.o. female.  HPI Patient presents from her primary care doctor with a hemoglobin of 7.7.  History of anemia due to GI bleed. She states over the last month she is been feeling generally weak and thought it was because of the claudication in her legs.  Has shortness of breath with exertion.  No fevers.  No cough.  No chest pain.  No blood in the stool or black stool seen at this time. Past Medical History:  Diagnosis Date  . AAA (abdominal aortic aneurysm), small, history of 2.3 X 2.4 07/06/2011  . Abnormal ankle brachial index 07/06/2011  . Anxiety   . CAD (coronary artery disease), with LAD stent in 2006, chronically occluded OM branch with hx. of Stent to RCA  07/06/2011  . Claudication in peripheral vascular disease, life style limiting 07/06/2011  . Hyperlipidemia LDL goal < 70 09/08/2012  . Hypertension   . PAD (peripheral artery disease) [facility])     Patient Active Problem List   Diagnosis Date Noted  . Anemia, iron deficiency 07/03/2017  . GI bleed 06/06/2016  . Acute blood loss anemia 06/06/2016  . Carotid artery disease (HCC) 12/29/2015  . Claudication (HCC) 05/14/2013  . Essential hypertension 04/28/2013  . Hyperlipidemia with target LDL less than 70 09/08/2012  . Claudication in peripheral vascular disease, life style limiting 07/06/2011  . Abnormal ankle brachial index 07/06/2011  . PAD Rt SFA stent 2006 with ISR 5/13. New RSFA stent 05/14/13 07/06/2011  . CAD- LAD stent in 2006, chronically occluded OM branch with hx. of Stent to RCA  07/06/2011  . AAA (abdominal aortic aneurysm), small, history of 2.3 X 2.4 07/06/2011    Past Surgical History:  Procedure Laterality Date  . ABDOMINAL AORTAGRAM N/A 07/05/2011   Procedure: ABDOMINAL Ronny Flurry;  Surgeon: Runell Gess, MD;  Location[facility] CATH LAB;  Service: Cardiovascular;  Laterality: N/A;  . ABDOMINAL HYSTERECTOMY  1992  . CATARACT EXTRACTION W/ INTRAOCULAR LENS  IMPLANT, BILATERAL Bilateral ~ 2006  . CORONARY ANGIOPLASTY WITH STENT PLACEMENT  2006; 2009; 2013   "1 + 1 + 2";  total of 4"  . ESOPHAGOGASTRODUODENOSCOPY (EGD) WITH PROPOFOL N/A 06/07/2016   Procedure: ESOPHAGOGASTRODUODENOSCOPY (EGD) WITH PROPOFOL;  Surgeon: Bernette Redbird, MD;  Location: [facility] ENDOSCOPY;  Service: Endoscopy;  Laterality: N/A;  . FEMORAL ARTERY STENT Right 2006; 07/05/2011; 05/14/2013  . FEMORAL ARTERY STENT Right 05/14/13   between previous stens  . LOWER EXTREMITY ANGIOGRAM N/A 07/05/2011   Procedure: LOWER EXTREMITY ANGIOGRAM;  Surgeon: Runell Gess, MD;  Location: [facility] CATH LAB;  Service: Cardiovascular;  Laterality: N/A;  . LOWER EXTREMITY ANGIOGRAM N/A 05/14/2013   Procedure: LOWER EXTREMITY ANGIOGRAM;  Surgeon: Runell Gess, MD;  Location: [facility] CATH LAB;  Service: Cardiovascular;  Laterality: N/A;  . LUMBAR DISC SURGERY  1980;; 1982  . PERCUTANEOUS STENT INTERVENTION Right 07/05/2011   Procedure: PERCUTANEOUS STENT INTERVENTION;  Surgeon: Runell Gess, MD;  Location: [facility] CATH LAB;  Service: Cardiovascular;  Laterality: Right;  . PERIPHERAL VASCULAR CATHETERIZATION N/A 11/04/2014   Procedure: Lower Extremity Angiography;  Surgeon: Runell Gess, MD;  Location: [facility] INVASIVE CV LAB;  Service: Cardiovascular;  Laterality: N/A;  . PERIPHERAL VASCULAR CATHETERIZATION Right 11/04/2014  Procedure: Peripheral Vascular Intervention;  Surgeon: Runell Gess, MD;  Location: [facility] INVASIVE CV LAB;  Service: Cardiovascular;  Laterality: Right;  SFA  . SHOULDER OPEN ROTATOR CUFF REPAIR Left ~ 2004     OB History   None      Home Medications    Prior to Admission medications   Medication Sig Start Date End Date Taking? Authorizing Provider  aspirin EC 81 MG tablet Take 81  mg by mouth daily.   Yes [provider]  atorvastatin (LIPITOR) 40 MG tablet Take 1 tablet (40 mg total) by mouth daily. Please schedule appointment for refills. 10/28/15  Yes Runell Gess, MD  cholecalciferol (VITAMIN D) 1000 units tablet Take 1,000 Units by mouth daily.   Yes [provider]  clopidogrel (PLAVIX) 75 MG tablet Take 1 tablet (75 mg total) by mouth daily. 04/04/17  Yes Runell Gess, MD  Glucosamine 500 MG CAPS Take 500 mg by mouth 2 (two) times daily.   Yes [provider]  losartan (COZAAR) 100 MG tablet Take 1 tablet (100 mg total) by mouth daily. 05/06/15  Yes Runell Gess, MD  Omega-3 Fatty Acids (FISH OIL) 1200 MG CAPS Take 1,200 mg by mouth daily.   Yes [provider]  pantoprazole (PROTONIX) 40 MG tablet Take 1 tablet (40 mg total) by mouth 2 (two) times daily. Patient taking differently: Take 40 mg by mouth daily.  06/07/16  Yes Noralee Stain, DO  atorvastatin (LIPITOR) 40 MG tablet Take 1 tablet (40 mg total) by mouth daily at 6 PM. Patient not taking: Reported on 01/08/2017 07/18/16   Runell Gess, MD    Family History Family History  Problem Relation Age of Onset  . Cancer Mother 38  . Heart attack Father        died 63  . Parkinson's disease Sister   . Heart attack Maternal Grandmother   . Heart attack Maternal Grandfather 50       died at 28  . Stroke Paternal Grandfather   . Heart disease Sister   . Deep vein thrombosis Daughter     Social History Social History   Tobacco Use  . Smoking status: Current Every Day Smoker    Packs/day: 1.00    Years: 50.00    Pack years: 50.00    Types: Cigarettes    Last attempt to quit: 07/14/2010    Years since quitting: 6.9  . Smokeless tobacco: Never Used  . Tobacco comment: Resumed smoking 8 months ago, smokes approximately 7 cigarettes daily.. Counseling given  Substance Use Topics  . Alcohol use: Yes    Alcohol/week: 3.6 oz    Types: 6 Glasses of wine per  week    Comment: 05/14/2013  "socially; 2-3 glasses of wine twice/wk"  . Drug use: No     Allergies   Patient has no known allergies.   Review of Systems Review of Systems  Constitutional: Positive for fatigue. Negative for unexpected weight change.  HENT: Negative for congestion.   Respiratory: Positive for shortness of breath.   Cardiovascular: Negative for chest pain.  Gastrointestinal: Negative for abdominal pain.  Genitourinary: Negative for flank pain.  Musculoskeletal: Negative for back pain.  Skin: Negative for rash.  Neurological: Negative for light-headedness.  Psychiatric/Behavioral: Negative for confusion.     Physical Exam Updated Vital Signs BP (!) 124/58   Pulse 66   Temp 99 F (37.2 C) (Oral)   Resp 18   Ht  (1.702  m)   Wt 65.9 kg (145 lb 4.5 oz)   SpO2 97%   BMI 22.75 kg/m   Physical Exam  Constitutional: She appears well-developed.  Eyes: Pupils are equal, round, and reactive to light.  Neck: Neck supple.  Cardiovascular: Normal rate.  Pulmonary/Chest:  Few scattered wheezes.  Abdominal: There is no tenderness.  Musculoskeletal: She exhibits no edema.  Neurological: She is alert.  Skin: Skin is warm. Capillary refill takes less than 2 seconds.     ED Treatments / Results  Labs (all labs ordered are listed, but only abnormal results are displayed) Labs Reviewed  BASIC METABOLIC PANEL - Abnormal; Notable for the following components:      Result Value   Glucose, Bld 108 (*)    All other components within normal limits  CBC - Abnormal; Notable for the following components:   RBC 3.19 (*)    Hemoglobin 7.6 (*)    HCT 25.0 (*)    MCH 23.8 (*)    RDW 16.7 (*)    All other components within normal limits  MAGNESIUM - Abnormal; Notable for the following components:   Magnesium 1.5 (*)    All other components within normal limits  CBC - Abnormal; Notable for the following components:   RBC 3.11 (*)    Hemoglobin 7.2 (*)    HCT 24.3  (*)    MCH 23.2 (*)    MCHC 29.6 (*)    RDW 16.6 (*)    All other components within normal limits  IRON AND TIBC - Abnormal; Notable for the following components:   Iron 12 (*)    Saturation Ratios 3 (*)    All other components within normal limits  FERRITIN - Abnormal; Notable for the following components:   Ferritin 5 (*)    All other components within normal limits  RETICULOCYTES - Abnormal; Notable for the following components:   RBC. 3.22 (*)    All other components within normal limits  TSH  VITAMIN B12  FOLATE  URINALYSIS, ROUTINE W REFLEX MICROSCOPIC  COMPREHENSIVE METABOLIC PANEL  CBC  CBC  POC OCCULT BLOOD, ED  TYPE AND SCREEN  ABO/RH  PREPARE RBC (CROSSMATCH)    EKG EKG Interpretation  Date/Time:  Wednesday Jul 03 2017 13:18:11 EDT Ventricular Rate:  74 PR Interval:    QRS Duration: 77 QT Interval:  363 QTC Calculation: 403 R Axis:   70 Text Interpretation:  Sinus rhythm Abnormal R-wave progression, early transition Confirmed by Benjiman Core [phone]) on 07/03/2017 3:15:22 PM   Radiology No results found.  Procedures Procedures (including critical care time)  Medications Ordered in ED Medications  atorvastatin (LIPITOR) tablet 40 mg (has no administration in time range)  cholecalciferol (VITAMIN D) tablet 1,000 Units (has no administration in time range)  clopidogrel (PLAVIX) tablet 75 mg (has no administration in time range)  aspirin EC tablet 81 mg (has no administration in time range)  pantoprazole (PROTONIX) EC tablet 40 mg (40 mg Oral Given 07/03/17 2242)  acetaminophen (TYLENOL) tablet 650 mg (has no administration in time range)    Or  acetaminophen (TYLENOL) suppository 650 mg (has no administration in time range)  0.9 %  sodium chloride infusion (has no administration in time range)  magnesium sulfate IVPB 2 g 50 mL (0 g Intravenous Stopped 07/03/17 1900)     Initial Impression / Assessment and Plan / ED Course  I have reviewed the  triage vital signs and the nursing notes.  Pertinent labs & imaging  results that were available during my care of the patient were reviewed by me and considered in my medical decision making (see chart for details).     Patient with shortness of breath.  Symptomatic anemia with history of same.  Hemoglobin 7.6.  Will admit for likely transfusion. Guaiac test was negative but reportedly did not crossover.  Final Clinical Impressions(s) / ED Diagnoses   Final diagnoses:  Symptomatic anemia    ED Discharge Orders    None       Benjiman Core, MD 07/04/17 [phone]

## 2017-07-04 ENCOUNTER — Inpatient Hospital Stay (HOSPITAL_COMMUNITY): Payer: Medicare Other

## 2017-07-04 DIAGNOSIS — D500 Iron deficiency anemia secondary to blood loss (chronic): Secondary | ICD-10-CM

## 2017-07-04 DIAGNOSIS — I2510 Atherosclerotic heart disease of native coronary artery without angina pectoris: Secondary | ICD-10-CM

## 2017-07-04 DIAGNOSIS — M79609 Pain in unspecified limb: Secondary | ICD-10-CM

## 2017-07-04 DIAGNOSIS — I714 Abdominal aortic aneurysm, without rupture: Secondary | ICD-10-CM

## 2017-07-04 DIAGNOSIS — I6523 Occlusion and stenosis of bilateral carotid arteries: Secondary | ICD-10-CM

## 2017-07-04 DIAGNOSIS — I10 Essential (primary) hypertension: Secondary | ICD-10-CM

## 2017-07-04 DIAGNOSIS — D62 Acute posthemorrhagic anemia: Principal | ICD-10-CM

## 2017-07-04 LAB — CBC
HEMATOCRIT: 34.2 % — AB (ref 36.0–46.0)
HEMATOCRIT: 31.6 % — AB (ref 36.0–46.0)
HEMOGLOBIN: 9.6 g/dL — AB (ref 12.0–15.0)
Hemoglobin: 10.6 g/dL — ABNORMAL LOW (ref 12.0–15.0)
MCH: 24.7 pg — ABNORMAL LOW (ref 26.0–34.0)
MCH: 24.1 pg — AB (ref 26.0–34.0)
MCHC: 31 g/dL (ref 30.0–36.0)
MCHC: 30.4 g/dL (ref 30.0–36.0)
MCV: 79.5 fL (ref 78.0–100.0)
MCV: 79.2 fL (ref 78.0–100.0)
PLATELETS: 283 10*3/uL (ref 150–400)
Platelets: 293 10*3/uL (ref 150–400)
RBC: 4.3 MIL/uL (ref 3.87–5.11)
RBC: 3.99 MIL/uL (ref 3.87–5.11)
RDW: 16.5 % — ABNORMAL HIGH (ref 11.5–15.5)
RDW: 16.3 % — ABNORMAL HIGH (ref 11.5–15.5)
WBC: 7.3 10*3/uL (ref 4.0–10.5)
WBC: 6.5 10*3/uL (ref 4.0–10.5)

## 2017-07-04 LAB — COMPREHENSIVE METABOLIC PANEL
ALT: 11 U/L — ABNORMAL LOW (ref 14–54)
ANION GAP: 11 (ref 5–15)
AST: 17 U/L (ref 15–41)
Albumin: 4.1 g/dL (ref 3.5–5.0)
Alkaline Phosphatase: 63 U/L (ref 38–126)
BILIRUBIN TOTAL: 0.7 mg/dL (ref 0.3–1.2)
BUN: 17 mg/dL (ref 6–20)
CHLORIDE: 106 mmol/L (ref 101–111)
CO2: 23 mmol/L (ref 22–32)
Calcium: 9.2 mg/dL (ref 8.9–10.3)
Creatinine, Ser: 0.83 mg/dL (ref 0.44–1.00)
GFR calc Af Amer: >60 mL/min (ref >60)
Glucose, Bld: 95 mg/dL (ref 65–99)
POTASSIUM: 4.1 mmol/L (ref 3.5–5.1)
Sodium: 140 mmol/L (ref 135–145)
TOTAL PROTEIN: 7 g/dL (ref 6.5–8.1)

## 2017-07-04 LAB — TROPONIN I
Troponin I: <0.03 ng/mL (ref <0.03)
Troponin I: <0.03 ng/mL (ref <0.03)

## 2017-07-04 LAB — URINALYSIS, ROUTINE W REFLEX MICROSCOPIC
Bilirubin Urine: NEGATIVE
GLUCOSE, UA: NEGATIVE mg/dL
Hgb urine dipstick: NEGATIVE
Ketones, ur: NEGATIVE mg/dL
LEUKOCYTES UA: NEGATIVE
NITRITE: NEGATIVE
PH: 6 (ref 5.0–8.0)
Protein, ur: NEGATIVE mg/dL
SPECIFIC GRAVITY, URINE: 1.012 (ref 1.005–1.030)

## 2017-07-04 LAB — ECHOCARDIOGRAM COMPLETE
HEIGHTINCHES: 67 in
Weight: 2324.53 oz

## 2017-07-04 MED ORDER — DIPHENHYDRAMINE HCL 25 MG PO CAPS
25.0000 mg | ORAL_CAPSULE | Freq: Every evening | ORAL | Status: DC | PRN
  Administered 2017-07-04: 25 mg via ORAL
  Filled 2017-07-04: qty 1

## 2017-07-04 MED ORDER — POLYSACCHARIDE IRON COMPLEX 150 MG PO CAPS
150.0000 mg | ORAL_CAPSULE | Freq: Every day | ORAL | Status: DC
Start: 2017-07-04 — End: 2017-07-05
  Administered 2017-07-04 – 2017-07-05 (×2): 150 mg via ORAL
  Filled 2017-07-04 (×2): qty 1

## 2017-07-04 MED ORDER — MAGNESIUM SULFATE 2 GM/50ML IV SOLN
2.0000 g | Freq: Once | INTRAVENOUS | Status: AC
  Administered 2017-07-04: 2 g via INTRAVENOUS
  Filled 2017-07-04: qty 50

## 2017-07-04 MED ORDER — LOSARTAN POTASSIUM 50 MG PO TABS
100.0000 mg | ORAL_TABLET | Freq: Every day | ORAL | Status: DC
  Administered 2017-07-04 – 2017-07-05 (×2): 100 mg via ORAL
  Filled 2017-07-04 (×2): qty 2

## 2017-07-04 NOTE — Progress Notes (Signed)
PROGRESS NOTE    Paizlie Klaus [facility]  ZOX:[id number] DOB: 01/12/1941 DOA: 07/03/2017 PCP: [name], MD   Brief Narrative: The patient is a 77 year old female with a history of AAA, Coronary artery disease with LAD stent in 2006, apparently has 4 cardiac stents, carotid artery disease, dyslipidemia, hypertension, and other comorbids who presented to the ED because of generalized weakness in her legs as well as shortness of breath.   Patient endorsed general weakness with intermittent claudication in legs and was supposed to have a venous Doppler and an arterial Doppler to be done by her PCP. Blood work at PCP office revealed hemoglobin of 7.7 and the patient was referred to the ED for further evaluation.  She was admitted for Anemia and SOB, transfused 2 units of pRBC's, and and currently being worked up.  Assessment & Plan:   Principal Problem:   Acute blood loss anemia Active Problems:   CAD- LAD stent in 2006, chronically occluded OM branch with hx. of Stent to RCA    AAA (abdominal aortic aneurysm), small, history of 2.3 X 2.4   Essential hypertension   Carotid artery disease (HCC)   Anemia, iron deficiency  Symptomatic Anemia with prior history of upper GI bleeding with an EGD in April 2018 that showed nonbleeding gastric ulcer -Patient presented and Hb/Hct the hospital 7.2/24.3 on admission.  She is transfused 2 units PRBCs and and improved to 10.6/34.2 but is now 9.6/31.6 -Dr. [name] discussed with Dr. [name] and since she is not actively bleeding, she does not need urgent EGD -She will follow-up with Dr. [name] outpatient -Continue Protonix twice a day -Anemia panel the patient's iron level is 12, U IBC is 416, TIBC is 428, saturation ratio was 3, ferritin level 5, folate level 14.7, vitamin B12 level 512 -We will start iron supplementation with iron polysaccharide 150 mg p.o. daily -Continue to monitor and repeat CBC in a.m. -Patient states her shortness of breath and denies  weakness has improved.  Coronary artery disease s/p Stenting  -She is followed by Dr. [name] -She will continue aspirin and Plavix and she is not actively bleeding -and she has multiple stents -Admitted to telemetry -Cycle cardiac enzymes and Negative so far -ECHO normal   Peripheral vascular disease with ongoing claudication -ABI's obtained and Right was 0.79 (Moderate Arterial Disease) and Left was Normal at 1.04 -LE Duplex showed no DVT but did show Bilateral lower extremities artery calcification.  -Continue with Aspirin 81 mg p.o. daily, atorvastatin 40 mg p.o. daily, as well as Clopidogrel 75 mg p.o. daily -Will Need vascular surgery evaluation but can be done as outpatient  Hypertension -Continue with Losartan 100 mg p.o. daily  Dyslipidemia/HLD -Continue with Atorvastatin 40 mg p.o. nightly  Hypomagnesemia -Patient's magnesium level yesterday was 1.5 -Do not see there is replete so we will give IV mag sulfate 2 g today. -Continue to monitor replete as necessary -Repeat magnesium level in a.m.   DVT prophylaxis: SCDs Code Status: FULL CODE Family Communication: No family present at bedside  Disposition Plan: Anticipate D/C Home in 24-48 hours  Consultants:   Gastroenterology via Telephone   Procedures:  ECHOCARDIOGRAM ------------------------------------------------------------------- Study Conclusions  - Left ventricle: The cavity size was normal. There was mild   concentric hypertrophy. Systolic function was normal. The   estimated ejection fraction was in the range of 55% to 60%. Wall   motion was normal; there were no regional wall motion   abnormalities. - Aortic root: The aortic root was mildly dilated. -  Mitral valve: There was mild to moderate regurgitation. - Left atrium: The atrium was mildly dilated.  BILATERAL LE DUPLEX Bilateral lower extremities venous duplex exam completed. Negative for DVT. Incidental finding: Bilateral lower extremities  arteries calcification noted.   ABI  RIGHT    LEFT    PRESSURE WAVEFORM  PRESSURE WAVEFORM  BRACHIAL 130 triphasic BRACHIAL 135 triphasic  DP 106 monophasic DP 106 biphasic  AT   AT    PT 79 monophasic PT 141 biphasic  PER   PER    GREAT TOE 87 absent GREAT TOE 63 normal    RIGHT LEFT  ABI 0.79 1.04   Antimicrobials:  Anti-infectives (From admission, onward)   None     Subjective: Seen and examined at bedside and states she is feeling better.  Denies shortness breath, nausea, vomiting.  States she wanted to walk today.  No other concerns or complaints at this time and denies any chest pain.  Objective: Vitals:   07/04/17 0155 07/04/17 0221 07/04/17 0430 07/04/17 1434  BP: 127/62 (!) 120/59 131/66 (!) 134/58  Pulse: 75 70 73 75  Resp: Temp: 98.8 F (37.1 C) 98.2 F (36.8 C) 98.3 F (36.8 C) 98.2 F (36.8 C)  TempSrc: Oral Oral Oral Oral  SpO2: 97% 96% 97% 98%  Weight:      Height:        Intake/Output Summary (Last 24 hours) at 07/04/2017 1942 Last data filed at 07/04/2017 0981 Gross per 24 hour  Intake 1402.5 ml  Output 400 ml  Net 1002.5 ml   Filed Weights   07/03/17 1701  Weight: 65.9 kg (145 lb 4.5 oz)   Examination: Physical Exam:  Constitutional: WN/WD Caucasian female in NAD and appears calm and comfortable Eyes: Lids and conjunctivae normal, sclerae anicteric  ENMT: External Ears, Nose appear normal. Grossly normal hearing. Mucous membranes are moist.  Neck: Appears normal, supple, no cervical masses, normal ROM, no appreciable thyromegaly, no JVD Respiratory: Diminished to auscultation bilaterally, no wheezing, rales, rhonchi or crackles. Normal respiratory effort and patient is not tachypenic. No accessory muscle use.  Cardiovascular: RRR, Has a murmur S1 and S2 auscultated. No extremity edema. Abdomen: Soft, non-tender, non-distended. No masses palpated. No appreciable hepatosplenomegaly. Bowel sounds positive x4.    GU: Deferred. Musculoskeletal: No clubbing / cyanosis of digits/nails. No joint deformity upper and lower extremities.  Skin: No rashes, lesions, ulcers on a limited skin evaluation. No induration; Warm and dry.  Neurologic: CN 2-12 grossly intact with no focal deficits. Romberg sign and cerebellar reflexes not assessed.  Psychiatric: Normal judgment and insight. Alert and oriented x 3. Normal mood and appropriate affect.   Data Reviewed: I have personally reviewed following labs and imaging studies  CBC: Recent Labs  Lab 07/03/17 1313 07/03/17 2131 07/04/17 0651 07/04/17 1439  WBC 6.0 7.0 7.3 6.5  HGB 7.6* 7.2* 10.6* 9.6*  HCT 25.0* 24.3* 34.2* 31.6*  MCV 78.4 78.1 79.5 79.2  PLT 336 304 283 293   Basic Metabolic Panel: Recent Labs  Lab 07/03/17 1313 07/04/17 0651  NA 138 140  K 3.6 4.1  CL 106 106  CO2 22 23  GLUCOSE 108* 95  BUN 17 17  CREATININE 0.81 0.83  CALCIUM 9.3 9.2  MG 1.5*  --    GFR: Estimated Creatinine Clearance: 56.1 mL/min (by C-G formula based on SCr of 0.83 mg/dL). Liver Function Tests: Recent Labs  Lab 07/04/17 0651  AST 17  ALT 11*  ALKPHOS  63  BILITOT 0.7  PROT 7.0  ALBUMIN 4.1   No results for input(s): LIPASE, AMYLASE in the last 168 hours. No results for input(s): AMMONIA in the last 168 hours. Coagulation Profile: No results for input(s): INR, PROTIME in the last 168 hours. Cardiac Enzymes: Recent Labs  Lab 07/04/17 0651 07/04/17 1439  TROPONINI <0.03 <0.03   BNP (last 3 results) No results for input(s): PROBNP in the last 8760 hours. HbA1C: No results for input(s): HGBA1C in the last 72 hours. CBG: No results for input(s): GLUCAP in the last 168 hours. Lipid Profile: No results for input(s): CHOL, HDL, LDLCALC, TRIG, CHOLHDL, LDLDIRECT in the last 72 hours. Thyroid Function Tests: Recent Labs    07/03/17 1313  TSH 1.163   Anemia Panel: Recent Labs    07/03/17 1313 07/03/17 2131  VITAMINB12  --  512  FOLATE   --  14.7  FERRITIN  --  5*  TIBC  --  428  IRON  --  12*  RETICCTPCT 1.4  --    Sepsis Labs: No results for input(s): PROCALCITON, LATICACIDVEN in the last 168 hours.  No results found for this or any previous visit (from the past 240 hour(s)).   Radiology Studies: No results found.  Scheduled Meds: . aspirin EC  81 mg Oral Daily  . atorvastatin  40 mg Oral q1800  . cholecalciferol  1,000 Units Oral Daily  . clopidogrel  75 mg Oral Daily  . losartan  100 mg Oral Daily  . pantoprazole  40 mg Oral BID   Continuous Infusions: . sodium chloride      LOS: 1 day   Merlene Laughter, DO [facility] Pager [phone]  If 7PM-7AM, please contact night-coverage [email or web address] Password [facility] 07/04/2017, 7:42 PM

## 2017-07-04 NOTE — Progress Notes (Signed)
Patient requested a sleep aid. She takes Tylenol PM at home. PCP was notified.

## 2017-07-04 NOTE — Progress Notes (Signed)
Pt states that she do not need HH at this present time.

## 2017-07-04 NOTE — Progress Notes (Signed)
  Echocardiogram 2D Echocardiogram has been performed.  Leta Jungling M 07/04/2017, 12:57 PM

## 2017-07-04 NOTE — Progress Notes (Signed)
Preliminary notes--Bilateral lower extremities venous duplex exam completed. Negative for DVT. Incidental finding: Bilateral lower extremities arteries calcification noted.   Sara Dominguez (RDMS RVT) 07/04/17 2:43 PM

## 2017-07-04 NOTE — Progress Notes (Addendum)
VASCULAR LAB PRELIMINARY  ARTERIAL  ABI completed:    RIGHT    LEFT    PRESSURE WAVEFORM  PRESSURE WAVEFORM  BRACHIAL 130 triphasic BRACHIAL 135 triphasic  DP 106 monophasic DP 106 biphasic  AT   AT    PT 79 monophasic PT 141 biphasic  PER   PER    GREAT TOE 87 absent GREAT TOE 63 normal    RIGHT LEFT  ABI 0.79 1.04     HONGYING  Dot Splinter, RVT 07/04/2017, 2:44 PM

## 2017-07-04 NOTE — Progress Notes (Signed)
PT Cancellation Note  Patient Details Name: Sara Dominguez MRN: [id number] DOB: [birth year]   Cancelled Treatment:    Reason Eval/Treat Not Completed: PT screened, no needs identified, will sign off, RN reports that patient ambulated halls without assistance.    Rada Hay 07/04/2017, 4:09 PM Blanchard Kelch PT [phone]

## 2017-07-05 DIAGNOSIS — D649 Anemia, unspecified: Secondary | ICD-10-CM

## 2017-07-05 LAB — BPAM RBC
BLOOD PRODUCT EXPIRATION DATE: 201906142359
BLOOD PRODUCT EXPIRATION DATE: 201906142359
ISSUE DATE / TIME: 201905230201
ISSUE DATE / TIME: 201905222212
Unit Type and Rh: 7300
Unit Type and Rh: 7300

## 2017-07-05 LAB — TYPE AND SCREEN
ABO/RH(D): B POS
Antibody Screen: NEGATIVE
UNIT DIVISION: 0
Unit division: 0

## 2017-07-05 LAB — COMPREHENSIVE METABOLIC PANEL
ALT: 12 U/L — AB (ref 14–54)
AST: 16 U/L (ref 15–41)
Albumin: 3.8 g/dL (ref 3.5–5.0)
Alkaline Phosphatase: 62 U/L (ref 38–126)
Anion gap: 9 (ref 5–15)
BUN: 14 mg/dL (ref 6–20)
CHLORIDE: 107 mmol/L (ref 101–111)
CO2: 25 mmol/L (ref 22–32)
CREATININE: 0.76 mg/dL (ref 0.44–1.00)
Calcium: 9.3 mg/dL (ref 8.9–10.3)
GFR calc non Af Amer: >60 mL/min (ref >60)
Glucose, Bld: 95 mg/dL (ref 65–99)
Potassium: 4 mmol/L (ref 3.5–5.1)
SODIUM: 141 mmol/L (ref 135–145)
Total Bilirubin: 0.5 mg/dL (ref 0.3–1.2)
Total Protein: 6.8 g/dL (ref 6.5–8.1)

## 2017-07-05 LAB — CBC WITH DIFFERENTIAL/PLATELET
Basophils Absolute: 0.1 10*3/uL (ref 0.0–0.1)
Basophils Relative: 1 %
EOS ABS: 0.2 10*3/uL (ref 0.0–0.7)
Eosinophils Relative: 3 %
HCT: 32.6 % — ABNORMAL LOW (ref 36.0–46.0)
HEMOGLOBIN: 10.2 g/dL — AB (ref 12.0–15.0)
LYMPHS ABS: 2.2 10*3/uL (ref 0.7–4.0)
Lymphocytes Relative: 33 %
MCH: 24.4 pg — AB (ref 26.0–34.0)
MCHC: 31.3 g/dL (ref 30.0–36.0)
MCV: 78 fL (ref 78.0–100.0)
Monocytes Absolute: 0.8 10*3/uL (ref 0.1–1.0)
Monocytes Relative: 13 %
NEUTROS PCT: 50 %
Neutro Abs: 3.3 10*3/uL (ref 1.7–7.7)
PLATELETS: 306 10*3/uL (ref 150–400)
RBC: 4.18 MIL/uL (ref 3.87–5.11)
RDW: 16.8 % — ABNORMAL HIGH (ref 11.5–15.5)
WBC: 6.6 10*3/uL (ref 4.0–10.5)

## 2017-07-05 LAB — PHOSPHORUS: PHOSPHORUS: 4.3 mg/dL (ref 2.5–4.6)

## 2017-07-05 LAB — MAGNESIUM: Magnesium: 2.2 mg/dL (ref 1.7–2.4)

## 2017-07-05 MED ORDER — POLYSACCHARIDE IRON COMPLEX 150 MG PO CAPS: 150.0000 mg | ORAL_CAPSULE | Freq: Every day | ORAL | 0 refills | Status: DC

## 2017-07-05 NOTE — Progress Notes (Signed)
Assumed care of pt from previous nurse.  Agree with previous nurses assessment. Will continue to monitor pt. 

## 2017-07-05 NOTE — Progress Notes (Signed)
Patient will be d/ced home with her husband and will f/u with GI outpatient

## 2017-07-05 NOTE — Discharge Summary (Signed)
Physician Discharge Summary  Sara Dominguez [facility] ZOX:[id number] DOB: Mar 16, 1940 DOA: 07/03/2017  PCP: Sara Hazel, MD  Admit date: 07/03/2017 Discharge date: 07/05/2017  Admitted From: Home Disposition: Home  Recommendations for Outpatient Follow-up:  1. Follow up with PCP in 1-2 weeks 2. Follow-up with Gastroenterology Dr. Matthias Dominguez in 1 to 2 weeks for outpatient evaluation for EGD for symptomatic anemia 3. Follow-up with Vascular Surgery/Cardiology for peripheral arterial disease in the outpatient setting 4. Follow-up with Cardiology for continued management of CAD 5. Please obtain CMP/CBC, Mag, Phos in one week 6. Please follow up on the following pending results:  Home Health: No  Equipment/Devices: None   Discharge Condition: Stable CODE STATUS: FULL CODE Diet recommendation: Heart Healthy Diet   Brief/Interim Summary: The patient is a 77 year old female with a history of AAA, Coronary artery disease with LAD stent in 2006, apparently has 4 cardiac stents, carotid artery disease, dyslipidemia, hypertension, and other comorbids who presented to the ED because of generalized weakness in her legs as well as shortness of breath.   Patient endorsed general weakness with intermittent claudication in legs and was supposed to have a venous Doppler and an arterial Doppler to be done by her PCP. Blood work at PCP office revealed hemoglobin of 7.7 and the patient was referred to the ED for further evaluation.  She was admitted for Anemia and SOB, transfused 2 units of pRBC's, and improved.  Cardiac enzymes were cycled and echocardiogram was drawn and was normal.  She also underwent ABIs and vascular studies which showed that she had a moderate arterial disease in the right leg.  She will need to follow-up with cardiology or vascular surgery outpatient setting.  She is deemed medically stable at this time to be discharged home as she is improved and her hemoglobin hematocrit has been stable and she  will be needing to follow-up with PCP, Gastroenterology Dr. Matthias Dominguez for outpatient endoscopy for symptomatic anemia, as well as Cardiology/Vascular Surgery for peripheral arterial disease.  Discharge Diagnoses:  Principal Problem:   Acute blood loss anemia Active Problems:   CAD- LAD stent in 2006, chronically occluded OM branch with hx. of Stent to RCA    AAA (abdominal aortic aneurysm), small, history of 2.3 X 2.4   Essential hypertension   Carotid artery disease (HCC)   Anemia, iron deficiency  Symptomatic Anemia with prior history of upper GI bleeding with an EGD in April 2018 that showed nonbleeding gastric ulcer, improved -Patient presented and Hb/Hct the hospital 7.2/24.3 on admission.  She is transfused 2 units PRBCs and and improved to 10.6/34.2 and is now 10.2/32.6 -Dr. Susie Dominguez discussed with Dr. Ewing Dominguez and since she is not actively bleeding, she does not need urgent EGD -She will follow-up with Dr. Matthias Dominguez outpatient for EGD for Symptomatic Anemia -Continue Protonix twice a day -Anemia panel the patient's iron level is 12, U IBC is 416, TIBC is 428, saturation ratio was 3, ferritin level 5, folate level 14.7, vitamin B12 level 512 -Started on Iron supplementation with iron polysaccharide 150 mg p.o. Daily and will continue at D/C  -Continue to monitor and repeat CBC in a.m. -Patient states her shortness of breath and denies weakness has improved.  Coronary Artery Disease s/p Stenting  -She is followed by Dr. Allyson Dominguez -She will continue Aspirin and Plavix and she is not actively bleeding -She has multiple stents -Admitted to telemetry -Cycle cardiac enzymes and Negative so far -ECHO normal with EF of 55-60%  Peripheral Vascular Diseasewith ongoing Claudication -ABI's obtained  and Right was 0.79 (Moderate Arterial Disease) and Left was Normal at 1.04 -LE Duplex showed no DVT but did show Bilateral lower extremities artery calcification. -Continue with Aspirin 81 mg p.o. daily,  atorvastatin 40 mg p.o. daily, as well as Clopidogrel 75 mg p.o. daily -Will Need Vascular surgery/ Cardiology evaluation but can be done as outpatient -Follow up with Dr. Allyson Dominguez in Cardiology or Dr. Imogene Dominguez in Vascular Surgery  Hypertension -Continue with Losartan 100 mg p.o. daily  Dyslipidemia/HLD -Continue with Atorvastatin 40 mg p.o. nightly  Hypomagnesemia, improved  -Patient's magnesium level yesterday was 1.5 and improved to 2.2 -Do not see there is replete so we will give IV mag sulfate 2 g yesterday. -Continue to monitor replete as necessary -Repeat magnesium level as an outpatient   Discharge Instructions  Discharge Instructions    Call MD for:  difficulty breathing, headache or visual disturbances   Complete by:  As directed    Call MD for:  extreme fatigue   Complete by:  As directed    Call MD for:  hives   Complete by:  As directed    Call MD for:  persistant dizziness or light-headedness   Complete by:  As directed    Call MD for:  persistant nausea and vomiting   Complete by:  As directed    Call MD for:  redness, tenderness, or signs of infection (pain, swelling, redness, odor or green/yellow discharge around incision site)   Complete by:  As directed    Call MD for:  severe uncontrolled pain   Complete by:  As directed    Call MD for:  temperature >100.4   Complete by:  As directed    Diet - low sodium heart healthy   Complete by:  As directed    Discharge instructions   Complete by:  As directed    Follow-up with primary care physician, as well as gastroenterology Dr. Matthias Dominguez in outpatient setting.  Also follow-up with cardiology/vascular surgery for peripheral arterial disease and evaluation.  Take all medications as prescribed.  If symptoms change or worsen please return to emergency room for evaluation.   Increase activity slowly   Complete by:  As directed      Allergies as of 07/05/2017   No Known Allergies     Medication List    TAKE these  medications   aspirin EC 81 MG tablet Take 81 mg by mouth daily.   atorvastatin 40 MG tablet Commonly known as:  LIPITOR Take 1 tablet (40 mg total) by mouth daily. Please schedule appointment for refills. What changed:  Another medication with the same name was removed. Continue taking this medication, and follow the directions you see here.   cholecalciferol 1000 units tablet Commonly known as:  VITAMIN D Take 1,000 Units by mouth daily.   clopidogrel 75 MG tablet Commonly known as:  PLAVIX Take 1 tablet (75 mg total) by mouth daily.   Fish Oil 1200 MG Caps Take 1,200 mg by mouth daily.   Glucosamine 500 MG Caps Take 500 mg by mouth 2 (two) times daily.   iron polysaccharides 150 MG capsule Commonly known as:  NIFEREX Take 1 capsule (150 mg total) by mouth daily. Start taking on:  07/06/2017   losartan 100 MG tablet Commonly known as:  COZAAR Take 1 tablet (100 mg total) by mouth daily.   pantoprazole 40 MG tablet Commonly known as:  PROTONIX Take 1 tablet (40 mg total) by mouth 2 (two) times daily. What  changed:  when to take this      Follow-up Information    Sara Hazel, MD. Call.   Specialty:  Family Medicine Why:  Follow up within 1 week Contact information: [address] [place] [address] [phone]        Bernette Redbird, MD. Call.   Specialty:  Gastroenterology Why:  Follow up in 1-2 weeks as an outpatient for EGD Contact information: [address] [place] [address] [phone]        Runell Gess, MD. Call.   Specialties:  Cardiology, Radiology Why:  Follow up with Cardiology for PAD Contact information: [address] [place] [address] [phone]        Fransisco Hertz, MD. Call.   Specialties:  Vascular Surgery, Cardiology Why:  Call for Evaluation of PAD for Abnormal ABI's Contact information: [address] [place] [address] [phone]          No Known  Allergies  Consultations: Case was discussed with Gastroenterology   Procedures/Studies:  No results found.  ECHOCARDIOGRAM ------------------------------------------------------------------- Study Conclusions  - Left ventricle: The cavity size was normal. There was mild concentric hypertrophy. Systolic function was normal. The estimated ejection fraction was in the range of 55% to 60%. Wall motion was normal; there were no regional wall motion abnormalities. - Aortic root: The aortic root was mildly dilated. - Mitral valve: There was mild to moderate regurgitation. - Left atrium: The atrium was mildly dilated.  BILATERAL LE DUPLEX Bilateral lower extremities venous duplex exam completed. Negative for DVT. Incidental finding: Bilateral lower extremities arteries calcification noted.  ABI  RIGHT    LEFT    PRESSURE WAVEFORM  PRESSURE WAVEFORM  BRACHIAL 130 triphasic BRACHIAL 135 triphasic  DP 106 monophasic DP 106 biphasic  AT   AT    PT 79 monophasic PT 141 biphasic  PER   PER    GREAT TOE 87 absent GREAT TOE 63 normal    RIGHT LEFT  ABI 0.79 1.04   Subjective: Seen examined at bedside and had improved.  States she improved and was not short of breath anymore but still felt a little weak.  No chest pain, heaviness or dizziness.  No other concerns or complaints at this time is ready to be discharged home.  Discharge Exam: Vitals:   07/04/17 2133 07/05/17 0531  BP: 130/71 136/69  Pulse: (!) 58 65  Resp: 16 18  Temp: 98.6 F (37 C) 97.7 F (36.5 C)  SpO2: 96% 96%   Vitals:   07/04/17 0430 07/04/17 1434 07/04/17 2133 07/05/17 0531  BP: 131/66 (!) 134/58 130/71 136/69  Pulse: 73 75 (!) 58 65  Resp: Temp: 98.3 F (36.8 C) 98.2 F (36.8 C) 98.6 F (37 C) 97.7 F (36.5 C)  TempSrc: Oral Oral Oral Oral  SpO2: 97% 98% 96% 96%  Weight:      Height:       General: Pt is alert, awake, not in acute distress Cardiovascular:  RRR, S1/S2 +, no rubs, no gallops Respiratory: Diminished bilaterally, no wheezing, no rhonchi Abdominal: Soft, NT, ND, bowel sounds + Extremities: no edema, no cyanosis  The results of significant diagnostics from this hospitalization (including imaging, microbiology, ancillary and laboratory) are listed below for reference.    Microbiology: No results found for this or any previous visit (from the past 240 hour(s)).   Labs: BNP (last 3 results) No results for input(s): BNP in the  last 8760 hours. Basic Metabolic Panel: Recent Labs  Lab 07/03/17 1313 07/04/17 0651 07/05/17 0410  NA 138 140 141  K 3.6 4.1 4.0  CL 106 106 107  CO2 GLUCOSE 108* 95 95  BUN CREATININE 0.81 0.83 0.76  CALCIUM 9.3 9.2 9.3  MG 1.5*  --  2.2  PHOS  --   --  4.3   Liver Function Tests: Recent Labs  Lab 07/04/17 0651 07/05/17 0410  AST 17 16  ALT 11* 12*  ALKPHOS 63 62  BILITOT 0.7 0.5  PROT 7.0 6.8  ALBUMIN 4.1 3.8   No results for input(s): LIPASE, AMYLASE in the last 168 hours. No results for input(s): AMMONIA in the last 168 hours. CBC: Recent Labs  Lab 07/03/17 1313 07/03/17 2131 07/04/17 0651 07/04/17 1439 07/05/17 0410  WBC 6.0 7.0 7.3 6.5 6.6  NEUTROABS  --   --   --   --  3.3  HGB 7.6* 7.2* 10.6* 9.6* 10.2*  HCT 25.0* 24.3* 34.2* 31.6* 32.6*  MCV 78.4 78.1 79.5 79.2 78.0  PLT 336 304 283 293 306   Cardiac Enzymes: Recent Labs  Lab 07/04/17 0651 07/04/17 1439 07/04/17 2040  TROPONINI <0.03 <0.03 <0.03   BNP: Invalid input(s): POCBNP CBG: No results for input(s): GLUCAP in the last 168 hours. D-Dimer No results for input(s): DDIMER in the last 72 hours. Hgb A1c No results for input(s): HGBA1C in the last 72 hours. Lipid Profile No results for input(s): CHOL, HDL, LDLCALC, TRIG, CHOLHDL, LDLDIRECT in the last 72 hours. Thyroid function studies Recent Labs    07/03/17 1313  TSH 1.163   Anemia work up Recent Labs    07/03/17 1313  07/03/17 2131  VITAMINB12  --  512  FOLATE  --  14.7  FERRITIN  --  5*  TIBC  --  428  IRON  --  12*  RETICCTPCT 1.4  --    Urinalysis    Component Value Date/Time   COLORURINE YELLOW 07/04/2017 0924   APPEARANCEUR CLEAR 07/04/2017 0924   LABSPEC 1.012 07/04/2017 0924   PHURINE 6.0 07/04/2017 0924   GLUCOSEU NEGATIVE 07/04/2017 0924   HGBUR NEGATIVE 07/04/2017 0924   BILIRUBINUR NEGATIVE 07/04/2017 0924   KETONESUR NEGATIVE 07/04/2017 0924   PROTEINUR NEGATIVE 07/04/2017 0924   NITRITE NEGATIVE 07/04/2017 0924   LEUKOCYTESUR NEGATIVE 07/04/2017 0924   Sepsis Labs Invalid input(s): PROCALCITONIN,  WBC,  LACTICIDVEN Microbiology No results found for this or any previous visit (from the past 240 hour(s)).  Time coordinating discharge: 35 minutes  SIGNED:  Merlene Laughter, DO [facility] 07/05/2017, 5:28 PM Pager [phone]  If 7PM-7AM, please contact night-coverage [email or web address] Password [email or web address]

## 2017-07-10 ENCOUNTER — Encounter (HOSPITAL_COMMUNITY): Payer: Medicare Other

## 2017-07-12 ENCOUNTER — Ambulatory Visit: Payer: Medicare Other | Admitting: Cardiovascular Disease

## 2017-07-12 ENCOUNTER — Encounter: Payer: Self-pay | Admitting: Cardiovascular Disease

## 2017-07-12 VITALS — BP 128/72 | HR 47 | Ht 67.0 in | Wt 145.2 lb

## 2017-07-12 DIAGNOSIS — I714 Abdominal aortic aneurysm, without rupture, unspecified: Secondary | ICD-10-CM

## 2017-07-12 DIAGNOSIS — I2510 Atherosclerotic heart disease of native coronary artery without angina pectoris: Secondary | ICD-10-CM | POA: Diagnosis not present

## 2017-07-12 DIAGNOSIS — I10 Essential (primary) hypertension: Secondary | ICD-10-CM | POA: Diagnosis not present

## 2017-07-12 DIAGNOSIS — I6523 Occlusion and stenosis of bilateral carotid arteries: Secondary | ICD-10-CM | POA: Diagnosis not present

## 2017-07-12 DIAGNOSIS — I739 Peripheral vascular disease, unspecified: Secondary | ICD-10-CM

## 2017-07-12 DIAGNOSIS — E785 Hyperlipidemia, unspecified: Secondary | ICD-10-CM | POA: Diagnosis not present

## 2017-07-12 NOTE — Assessment & Plan Note (Signed)
History of CAD status post LAD stenting by myself back in 2006 with re-cath 08/05/2009 revealing a patent LAD stent, chronically occluded OM branch with high-grade mid RCA stenosis that I stented with a Taxus Ion drug-eluting stent.  Denies chest pain or shortness of breath.

## 2017-07-12 NOTE — Assessment & Plan Note (Signed)
History of essential hypertension with blood pressure measured today at 120/72.  She is on losartan.  Continue current meds at current dosing.

## 2017-07-12 NOTE — Assessment & Plan Note (Addendum)
History of peripheral arterial disease status post right SFA stenting by myself back in 2006.  She is had multiple procedures on her right leg since that time including Viabahn covered stenting, restenting with subsequent improvement in her symptoms and Doppler studies.  Her most recent arterial Doppler studies performed at [facility] during her recent hospitalization showed a decline in her right ABI from 0.97 ->..79 suggesting restenosis somewhere within her right SFA although she is currently asymptomatic from this after being transfused 2 units of packed red blood cells for symptomatic anemia.

## 2017-07-12 NOTE — Progress Notes (Signed)
07/12/2017 Nupur Hohman [facility]   January 30, 1941  [id number]  Primary Physician [name], MD Primary Cardiologist: Runell Gess MD FACP, [place], [place], [place]  HPI:  Sara Dominguez is a 77 y.o.  thin-appearing, married Caucasian female, mother of 2, grandmother to 3 grandchildren who I last saw in the office 01/08/2017. She has a history of CAD status post LAD stenting by myself in 2006, as well as right SFA stenting several months thereafter. She just stopped smoking in July of last year. I catheterized her August 05, 2009, revealing a patent LAD stent and a chronically occluded OM branch with high-grade mid RCA lesion which I stented with a Taxus Ion drug-eluting stent. At that time, I demonstrated a small abdominal aortic aneurysm measuring 2.3 x 2.4 cm. In addition, she developed recurrent lower extremity pain with Dopplers that showed a decrease in the right ABI from 0.99 to 0.75 with a high frequency signal in her mid right SFA. I angiogram'd her Jul 05, 2011, revealing proximal right SFA lesion along with in-stent restenosis. I revascularized her percutaneously which resulted in marked improvement in her ability to ambulate and in her Dopplers. She did develop arm pain while at [facility] in July of last year and was cath'd at [facility] by Dr. Ellis Parents radially who placed 2 drug-eluting stents in the right coronary artery.  Because of recurrent claudication and abnormal Doppler studies I re-angiogramed to her 05/14/13 revealing high-grade disease in the mid right SFA between 2 previously placed stents. I restarted her with a 6 mm x 100 mm long Viabahn covered stent with excellent angiographic and clinical result. Her follow-up lower extremity arterial Doppler studies performed 12/04/13 revealed ABIs of 1 bilaterally with a widely patent stent. Since I saw her last she is complaining of some right lateral thigh pain and tingling in her right foot as well as dysesthesia in both upper  extremities. She does have a palpable pedal pulse on the right side. I performed a lower extremity ultrasound on her 09/27/14 revealing a high-frequency signal in the mid right SFA with a decline in her right ABI from 1.0 -.79. I angiogramed her 11/04/14 revealing a 95% stenosis of the leading edge of the previously placed covered stent which I restented using a 6 mm x 25 mm longViabahncovered stent recent lower extremity arterial Doppler studies performed 07/25/16 revealed widely patent stents in her right SFA with ABIs of approximately 1 bilaterally. She denies claudication. In addition, she denies chest pain or shortness of breath. She did have endoscopy performed back in April because of anemia and a positive occult fecal blood test and this demonstrated a gastric ulcer. Her antiplatelet locations where held for a short period of time. She was recently admitted to the hospital with of breath and lower extremity weakness.  2D echo was essentially normal.  Her hemoglobin was in the low 7 range and she was transfused 2 units of packed red blood cells which improved her shortness of breath and claudication.  Dopplers did however show a decline in her right ABI down to the .79 range similar to prior Dopplers when she had restenosed on that side although she currently denies claudication.      Current Meds  Medication Sig  . atorvastatin (LIPITOR) 40 MG tablet Take 1 tablet (40 mg total) by mouth daily. Please schedule appointment for refills.  . cholecalciferol (VITAMIN D) 1000 units tablet Take 1,000 Units by mouth daily.  . clopidogrel (PLAVIX) 75  MG tablet Take 1 tablet (75 mg total) by mouth daily.  . Glucosamine 500 MG CAPS Take 500 mg by mouth 2 (two) times daily.  . iron polysaccharides (NIFEREX) 150 MG capsule Take 1 capsule (150 mg total) by mouth daily.  Marland Kitchen losartan (COZAAR) 100 MG tablet Take 1 tablet (100 mg total) by mouth daily.  . Omega-3 Fatty Acids (FISH OIL) 1200 MG CAPS Take 1,200 mg  by mouth daily.  . pantoprazole (PROTONIX) 40 MG tablet Take 1 tablet (40 mg total) by mouth 2 (two) times daily. (Patient taking differently: Take 40 mg by mouth daily. )  . [DISCONTINUED] aspirin EC 81 MG tablet Take 81 mg by mouth daily.     No Known Allergies  Social History   Socioeconomic History  . Marital status: Married    Spouse name: Not on file  . Number of children: Not on file  . Years of education: Not on file  . Highest education level: Not on file  Occupational History  . Not on file  Social Needs  . Financial resource strain: Not on file  . Food insecurity:    Worry: Not on file    Inability: Not on file  . Transportation needs:    Medical: Not on file    Non-medical: Not on file  Tobacco Use  . Smoking status: Current Every Day Smoker    Packs/day: 1.00    Years: 50.00    Pack years: 50.00    Types: Cigarettes    Last attempt to quit: 07/14/2010    Years since quitting: 7.0  . Smokeless tobacco: Never Used  . Tobacco comment: Resumed smoking 8 months ago, smokes approximately 7 cigarettes daily.. Counseling given  Substance and Sexual Activity  . Alcohol use: Yes    Alcohol/week: 3.6 oz    Types: 6 Glasses of wine per week    Comment: 05/14/2013  "socially; 2-3 glasses of wine twice/wk"  . Drug use: No  . Sexual activity: Not Currently  Lifestyle  . Physical activity:    Days per week: Not on file    Minutes per session: Not on file  . Stress: Not on file  Relationships  . Social connections:    Talks on phone: Not on file    Gets together: Not on file    Attends religious service: Not on file    Active member of club or organization: Not on file    Attends meetings of clubs or organizations: Not on file    Relationship status: Not on file  . Intimate partner violence:    Fear of current or ex partner: Not on file    Emotionally abused: Not on file    Physically abused: Not on file    Forced sexual activity: Not on file  Other Topics Concern    . Not on file  Social History Narrative  . Not on file     Review of Systems: General: negative for chills, fever, night sweats or weight changes.  Cardiovascular: negative for chest pain, dyspnea on exertion, edema, orthopnea, palpitations, paroxysmal nocturnal dyspnea or shortness of breath Dermatological: negative for rash Respiratory: negative for cough or wheezing Urologic: negative for hematuria Abdominal: negative for nausea, vomiting, diarrhea, bright red blood per rectum, melena, or hematemesis Neurologic: negative for visual changes, syncope, or dizziness All other systems reviewed and are otherwise negative except as noted above.    Blood pressure 128/72, pulse (!) 47, height  (1.702 m), weight 145 lb  3.2 oz (65.9 kg).  General appearance: alert and no distress Neck: no adenopathy, no carotid bruit, no JVD, supple, symmetrical, trachea midline and thyroid not enlarged, symmetric, no tenderness/mass/nodules Lungs: clear to auscultation bilaterally Heart: regular rate and rhythm, S1, S2 normal, no murmur, click, rub or gallop Extremities: extremities normal, atraumatic, no cyanosis or edema Pulses: Decreased right pedal pulse Skin: Skin color, texture, turgor normal. No rashes or lesions Neurologic: Alert and oriented X 3, normal strength and tone. Normal symmetric reflexes. Normal coordination and gait  EKG not performed today  ASSESSMENT AND PLAN:   Claudication in peripheral vascular disease, life style limiting History of peripheral arterial disease status post right SFA stenting by myself back in 2006.  She is had multiple procedures on her right leg since that time including Viabahn covered stenting, restenting with subsequent improvement in her symptoms and Doppler studies.  Her most recent arterial Doppler studies performed at [facility] during her recent hospitalization showed a decline in her right ABI from 0.97 ->..79 suggesting restenosis somewhere  within her right SFA although she is currently asymptomatic from this after being transfused 2 units of packed red blood cells for symptomatic anemia.  CAD- LAD stent in 2006, chronically occluded OM branch with hx. of Stent to RCA  History of CAD status post LAD stenting by myself back in 2006 with re-cath 08/05/2009 revealing a patent LAD stent, chronically occluded OM branch with high-grade mid RCA stenosis that I stented with a Taxus Ion drug-eluting stent.  Denies chest pain or shortness of breath.  Hyperlipidemia with target LDL less than 70 History of hyperlipidemia on statin therapy  Essential hypertension History of essential hypertension with blood pressure measured today at 120/72.  She is on losartan.  Continue current meds at current dosing.      Runell Gess MD FACP,FACC,FAHA, [facility] 07/12/2017 11:24 AM

## 2017-07-12 NOTE — Assessment & Plan Note (Signed)
History of hyperlipidemia on statin therapy. 

## 2017-07-12 NOTE — Patient Instructions (Addendum)
Medication Instructions: Your physician recommends that you continue on your current medications as directed. Please refer to the Current Medication list given to you today.  OK to discontinue Aspirin   Testing/Procedures:  Every 6 months: Your physician has requested that you have a lower extremity arterial duplex. During this test, ultrasound is used to evaluate arterial blood flow in the legs. Allow one hour for this exam. There are no restrictions or special instructions.  Your physician has requested that you have an ankle brachial index (ABI). During this test an ultrasound and blood pressure cuff are used to evaluate the arteries that supply the arms and legs with blood. Allow thirty minutes for this exam. There are no restrictions or special instructions.  Follow-Up: We request that you follow-up in: 6 months with an extender and in 12 months with Dr San MorelleBerry  You will receive a reminder letter in the mail two months in advance. If you don't receive a letter, please call our office to schedule the follow-up appointment.  If you need a refill on your cardiac medications before your next appointment, please call your pharmacy.

## 2017-07-31 ENCOUNTER — Ambulatory Visit (HOSPITAL_COMMUNITY)
Admission: RE | Admit: 2017-07-31 | Discharge: 2017-07-31 | Disposition: A | Discharge status: Home / Self Care | Payer: Medicare Other | Source: Ambulatory Visit | Attending: Cardiovascular Disease | Admitting: Cardiovascular Disease

## 2017-07-31 DIAGNOSIS — I739 Peripheral vascular disease, unspecified: Secondary | ICD-10-CM | POA: Insufficient documentation

## 2017-08-05 ENCOUNTER — Other Ambulatory Visit: Payer: Self-pay | Admitting: *Deleted

## 2017-08-05 DIAGNOSIS — I739 Peripheral vascular disease, unspecified: Secondary | ICD-10-CM

## 2017-08-08 ENCOUNTER — Other Ambulatory Visit: Payer: Self-pay | Admitting: Gastroenterology

## 2017-08-09 ENCOUNTER — Other Ambulatory Visit: Payer: Self-pay | Admitting: Gastroenterology

## 2017-08-28 ENCOUNTER — Other Ambulatory Visit: Payer: Self-pay

## 2017-08-28 ENCOUNTER — Encounter (HOSPITAL_COMMUNITY): Payer: Self-pay

## 2017-08-29 ENCOUNTER — Ambulatory Visit (HOSPITAL_COMMUNITY): Payer: Medicare Other | Admitting: Anesthesiology

## 2017-08-29 ENCOUNTER — Ambulatory Visit (HOSPITAL_COMMUNITY)
Admission: RE | Admit: 2017-08-29 | Discharge: 2017-08-29 | Disposition: A | Discharge status: Home / Self Care | Payer: Medicare Other | Source: Ambulatory Visit | Attending: Gastroenterology | Admitting: Gastroenterology

## 2017-08-29 ENCOUNTER — Encounter (HOSPITAL_COMMUNITY)
Admission: RE | Disposition: A | Discharge status: Home / Self Care | Payer: Self-pay | Source: Ambulatory Visit | Attending: Gastroenterology

## 2017-08-29 ENCOUNTER — Encounter (HOSPITAL_COMMUNITY): Payer: Self-pay | Admitting: Anesthesiology

## 2017-08-29 DIAGNOSIS — Z95820 Peripheral vascular angioplasty status with implants and grafts: Secondary | ICD-10-CM | POA: Diagnosis not present

## 2017-08-29 DIAGNOSIS — Z79899 Other long term (current) drug therapy: Secondary | ICD-10-CM | POA: Insufficient documentation

## 2017-08-29 DIAGNOSIS — I2510 Atherosclerotic heart disease of native coronary artery without angina pectoris: Secondary | ICD-10-CM | POA: Insufficient documentation

## 2017-08-29 DIAGNOSIS — Z7902 Long term (current) use of antithrombotics/antiplatelets: Secondary | ICD-10-CM | POA: Diagnosis not present

## 2017-08-29 DIAGNOSIS — Z955 Presence of coronary angioplasty implant and graft: Secondary | ICD-10-CM | POA: Diagnosis not present

## 2017-08-29 DIAGNOSIS — F17210 Nicotine dependence, cigarettes, uncomplicated: Secondary | ICD-10-CM | POA: Diagnosis not present

## 2017-08-29 DIAGNOSIS — I10 Essential (primary) hypertension: Secondary | ICD-10-CM | POA: Insufficient documentation

## 2017-08-29 DIAGNOSIS — E785 Hyperlipidemia, unspecified: Secondary | ICD-10-CM | POA: Diagnosis not present

## 2017-08-29 DIAGNOSIS — F17290 Nicotine dependence, other tobacco product, uncomplicated: Secondary | ICD-10-CM | POA: Insufficient documentation

## 2017-08-29 DIAGNOSIS — I739 Peripheral vascular disease, unspecified: Secondary | ICD-10-CM | POA: Diagnosis not present

## 2017-08-29 DIAGNOSIS — K31811 Angiodysplasia of stomach and duodenum with bleeding: Secondary | ICD-10-CM | POA: Insufficient documentation

## 2017-08-29 DIAGNOSIS — K449 Diaphragmatic hernia without obstruction or gangrene: Secondary | ICD-10-CM | POA: Diagnosis not present

## 2017-08-29 DIAGNOSIS — K31819 Angiodysplasia of stomach and duodenum without bleeding: Secondary | ICD-10-CM | POA: Diagnosis present

## 2017-08-29 HISTORY — PX: ENTEROSCOPY: SHX5533

## 2017-08-29 HISTORY — PX: HOT HEMOSTASIS: SHX5433

## 2017-08-29 LAB — CBC
HCT: 34 % — ABNORMAL LOW (ref 36.0–46.0)
Hemoglobin: 10.8 g/dL — ABNORMAL LOW (ref 12.0–15.0)
MCH: 27.5 pg (ref 26.0–34.0)
MCHC: 31.8 g/dL (ref 30.0–36.0)
MCV: 86.5 fL (ref 78.0–100.0)
PLATELETS: 289 10*3/uL (ref 150–400)
RBC: 3.93 MIL/uL (ref 3.87–5.11)
RDW: 24.9 % — AB (ref 11.5–15.5)
WBC: 7.5 10*3/uL (ref 4.0–10.5)

## 2017-08-29 SURGERY — EGD, WITH ARGON PLASMA COAGULATION
Anesthesia: Monitor Anesthesia Care

## 2017-08-29 MED ORDER — SODIUM CHLORIDE 0.9 % IV SOLN: INTRAVENOUS | Status: DC

## 2017-08-29 MED ORDER — LIDOCAINE 2% (20 MG/ML) 5 ML SYRINGE
INTRAMUSCULAR | Status: DC | PRN
  Administered 2017-08-29 (×2): 100 mg via INTRAVENOUS

## 2017-08-29 MED ORDER — PROPOFOL 10 MG/ML IV BOLUS
INTRAVENOUS | Status: DC | PRN
  Administered 2017-08-29 (×3): 20 mg via INTRAVENOUS

## 2017-08-29 MED ORDER — PROPOFOL 10 MG/ML IV BOLUS
INTRAVENOUS | Status: AC
  Filled 2017-08-29: qty 40

## 2017-08-29 MED ORDER — LACTATED RINGERS IV SOLN
INTRAVENOUS | Status: DC
Start: 2017-08-29 — End: 2017-08-29
  Administered 2017-08-29: 08:00:00 via INTRAVENOUS

## 2017-08-29 MED ORDER — ONDANSETRON HCL 4 MG/2ML IJ SOLN
INTRAMUSCULAR | Status: DC | PRN
  Administered 2017-08-29: 4 mg via INTRAVENOUS

## 2017-08-29 MED ORDER — LACTATED RINGERS IV SOLN: INTRAVENOUS | Status: DC

## 2017-08-29 MED ORDER — PROPOFOL 500 MG/50ML IV EMUL
INTRAVENOUS | Status: DC | PRN
  Administered 2017-08-29: 120 ug/kg/min via INTRAVENOUS

## 2017-08-29 NOTE — Discharge Instructions (Signed)

## 2017-08-29 NOTE — Anesthesia Preprocedure Evaluation (Signed)
Anesthesia Evaluation  Patient identified by MRN, date of birth, ID band Patient awake    Reviewed: Allergy & Precautions, H&P , NPO status , Patient's Chart, lab work & pertinent test results  Airway Mallampati: II   Neck ROM: full    Dental   Pulmonary Current Smoker,    breath sounds clear to auscultation       Cardiovascular hypertension, + CAD, + Cardiac Stents and + Peripheral Vascular Disease   Rhythm:regular Rate:Normal  AAA 2.3 x 2.4 cm   Neuro/Psych Anxiety    GI/Hepatic   Endo/Other    Renal/GU      Musculoskeletal   Abdominal   Peds  Hematology  (+) anemia ,   Anesthesia Other Findings   Reproductive/Obstetrics                             Anesthesia Physical Anesthesia Plan  ASA: III  Anesthesia Plan: MAC   Post-op Pain Management:    Induction: Intravenous  PONV Risk Score and Plan: 1 and Ondansetron, Propofol infusion and Treatment may vary due to age or medical condition  Airway Management Planned: Nasal Cannula  Additional Equipment:   Intra-op Plan:   Post-operative Plan:   Informed Consent: I have reviewed the patients History and Physical, chart, labs and discussed the procedure including the risks, benefits and alternatives for the proposed anesthesia with the patient or authorized representative who has indicated his/her understanding and acceptance.     Plan Discussed with: Anesthesiologist, CRNA and Surgeon  Anesthesia Plan Comments:         Anesthesia Quick Evaluation

## 2017-08-29 NOTE — Op Note (Signed)
[facility] Patient Name: Sara Dominguez Procedure Date: 08/29/2017 MRN: [id number] Attending MD: Bernette Redbird , MD Date of Birth: December 05, 1940 CSN: [id number] Age: 77 Admit Type: Outpatient Procedure:                Small bowel enteroscopy Indications:              Obscure gastrointestinal bleeding (recent anemia                            requiring ER transfusion, with outpatient egd at                            office endoscopy unit 08/07/2017 showing several                            gastric AVM's, one of which was oozing), For                            therapy of angiodysplasia (of intestine) Providers:                Bernette Redbird, MD, Janae Sauce. Steele Berg, RN, Zoila Shutter, Technician, Leroy Libman, CRNA Referring MD:              Medicines:                Monitored Anesthesia Care Complications:            No immediate complications. Estimated Blood Loss:     Estimated blood loss was minimal. Procedure:                After obtaining informed consent, the ultraslim                            colonoscope was passed under direct vision.                            Throughout the procedure, the patient's blood                            pressure, pulse, and oxygen saturations were                            monitored continuously. The scope was introduced                            through the mouth, and advanced to the proximal                            jejunum. After inspection using the colonoscope as                            an enteroscope, I switched out to the gastroscope  for [facility] the [facility] treatment as described below.                            After obtaining informed consent, the endoscope was                            passed under direct vision. Throughout the                            procedure, the patient's blood pressure, pulse, and                            oxygen saturations were monitored  continuously. The                            small bowel enteroscopy was accomplished without                            difficulty. The patient tolerated the procedure                            well. Scope In: Scope Out: Findings:      A small hiatal hernia was present.      Three 4 to 6 mm angiodysplastic lesions (one with minimal active oozing,       in the fundus) were found on the greater curvature of the stomach.       Coagulation for hemostasis using argon plasma was successful for the       fundic lesion; the other two were also cauterized to ablation. Estimated       blood loss was minimal. The stomach was otherwise normal.      There was no evidence of significant pathology in the entire examined       duodenum.      There was no evidence of significant pathology in the proximal jejunum. Impression:               - Small hiatal hernia.                           - Three angiodysplastic lesions in the stomach.                            Treated with argon plasma coagulation (APC). The                            proximal AVM, located in the fundus, had active                            minimal oozing (with some hematin strands nearby at                            the start of the procedure, plus a small plume of                            fresh blood arising from it),  with complete                            hemostasis following APC treatment.                           - Normal examined duodenum.                           - The examined portion of the jejunum was normal.                           - No specimens collected. Recommendation:           - Resume Plavix (clopidogrel) at prior dose today. Procedure Code(s):        --- Professional ---                           [phone], Small intestinal endoscopy, enteroscopy                            beyond second portion of duodenum, not including                            ileum; with control of bleeding (eg, injection,                             bipolar cautery, unipolar cautery, laser, heater                            probe, stapler, plasma coagulator) Diagnosis Code(s):        --- Professional ---                           B14.782, Angiodysplasia of stomach and duodenum                            with bleeding                           K92.2, Gastrointestinal hemorrhage, unspecified CPT copyright 2017 American Medical Association. All rights reserved. The codes documented in this report are preliminary and upon coder review may  be revised to meet current compliance requirements. Bernette Redbird, MD 08/29/2017 9:16:33 AM This report has been signed electronically. Number of Addenda: 0

## 2017-08-29 NOTE — H&P (Signed)
Sara Dominguez is an 77 y.o. female.   Chief Complaint: Recurrent anemia, gastric vascular ectasia HPI:  This 77 year old female with coronary and peripheral vascular disease has a history of anemia presumably related to chronic GI tract blood loss, on chronic Plavix because of coronary and peripheral vascular stents.  On June 26, she underwent endoscopy at our office endoscopy unit, which showed active oozing from a vascular malformation in the proximal stomach.  She also had a couple of other gastric vascular malformations.  About a year ago, she had anemia and a documented gastric ulcer but she was on aspirin at that time; there was no evidence of active ulcer disease on the recent endoscopy 3 weeks ago.    Past Medical History:  Diagnosis Date  . AAA (abdominal aortic aneurysm), small, history of 2.3 X 2.4 07/06/2011  . Abnormal ankle brachial index 07/06/2011  . Anxiety   . CAD (coronary artery disease), with LAD stent in 2006, chronically occluded OM branch with hx. of Stent to RCA  07/06/2011  . Claudication in peripheral vascular disease, life style limiting 07/06/2011  . Hyperlipidemia LDL goal < 70 09/08/2012  . Hypertension   . PAD (peripheral artery disease) (HCC)     Past Surgical History:  Procedure Laterality Date  . ABDOMINAL AORTAGRAM N/A 07/05/2011   Procedure: ABDOMINAL Ronny FlurryAORTAGRAM;  Surgeon: Runell GessJonathan J Berry, MD;  [facility];  Service: Cardiovascular;  Laterality: N/A;  . ABDOMINAL HYSTERECTOMY  1992  . CATARACT EXTRACTION W/ INTRAOCULAR LENS  IMPLANT, BILATERAL Bilateral ~ 2006  . CORONARY ANGIOPLASTY WITH STENT PLACEMENT  2006; 2009; 2013   "1 + 1 + 2";  total of 4"  . ESOPHAGOGASTRODUODENOSCOPY (EGD) WITH PROPOFOL N/A 06/07/2016   Procedure: ESOPHAGOGASTRODUODENOSCOPY (EGD) WITH PROPOFOL;  Surgeon: Bernette Redbirdobert Zarah Carbon, MD;  Location: [facility] ENDOSCOPY;  Service: Endoscopy;  Laterality: N/A;  . FEMORAL ARTERY STENT Right 2006; 07/05/2011; 05/14/2013  . FEMORAL ARTERY STENT  Right 05/14/13   between previous stens  . LOWER EXTREMITY ANGIOGRAM N/A 07/05/2011   Procedure: LOWER EXTREMITY ANGIOGRAM;  Surgeon: Runell GessJonathan J Berry, MD;  [facility];  Service: Cardiovascular;  Laterality: N/A;  . LOWER EXTREMITY ANGIOGRAM N/A 05/14/2013   Procedure: LOWER EXTREMITY ANGIOGRAM;  Surgeon: Runell GessJonathan J Berry, MD;  [facility];  Service: Cardiovascular;  Laterality: N/A;  . LUMBAR DISC SURGERY  1980;; 1982  . PERCUTANEOUS STENT INTERVENTION Right 07/05/2011   Procedure: PERCUTANEOUS STENT INTERVENTION;  Surgeon: Runell GessJonathan J Berry, MD;  [facility];  Service: Cardiovascular;  Laterality: Right;  . PERIPHERAL VASCULAR CATHETERIZATION N/A 11/04/2014   Procedure: Lower Extremity Angiography;  Surgeon: Runell GessJonathan J Berry, MD;  [facility];  Service: Cardiovascular;  Laterality: N/A;  . PERIPHERAL VASCULAR CATHETERIZATION Right 11/04/2014   Procedure: Peripheral Vascular Intervention;  Surgeon: Runell GessJonathan J Berry, MD;  [facility];  Service: Cardiovascular;  Laterality: Right;  SFA  . SHOULDER OPEN ROTATOR CUFF REPAIR Left ~ 2004    Family History  Problem Relation Age of Onset  . Cancer Mother [id number]  . Heart attack Father        died 6569  . Parkinson's disease Sister   . Heart attack Maternal Grandmother   . Heart attack Maternal Grandfather 50       died at 6050  . Stroke Paternal Grandfather   . Heart disease Sister   . Deep vein thrombosis Daughter    Social History:  reports that she has been  smoking cigarettes and e-cigarettes.  She has a 50.00 pack-year smoking history. She has never used smokeless tobacco. She reports that she drinks about 1.8 oz of alcohol per week. She reports that she does not use drugs.  Allergies: No Known Allergies  Medications Prior to Admission  Medication Sig Dispense Refill  . atorvastatin (LIPITOR) 40 MG tablet Take 1 tablet (40 mg total) by mouth daily. Please schedule appointment for refills.  90 tablet 1  . cholecalciferol (VITAMIN D) 1000 units tablet Take 1,000 Units by mouth daily.    . clopidogrel (PLAVIX) 75 MG tablet Take 1 tablet (75 mg total) by mouth daily. 90 tablet 3  . ferrous sulfate 325 (65 FE) MG tablet Take 325 mg by mouth daily with breakfast.    . Glucosamine 500 MG CAPS Take 500 mg by mouth daily.     Marland Kitchen losartan (COZAAR) 100 MG tablet Take 1 tablet (100 mg total) by mouth daily. 30 tablet 0  . Omega-3 Fatty Acids (FISH OIL) 1000 MG CAPS Take 1,000 mg by mouth daily.     . pantoprazole (PROTONIX) 40 MG tablet Take 1 tablet (40 mg total) by mouth 2 (two) times daily. (Patient taking differently: Take 40 mg by mouth daily. ) 60 tablet 0  . iron polysaccharides (NIFEREX) 150 MG capsule Take 1 capsule (150 mg total) by mouth daily. (Patient not taking: Reported on 08/28/2017) 30 capsule 0    No results found for this or any previous visit (from the past 48 hour(s)). No results found.  ROS has felt well.  Stools are chronically dark because of iron supplementation, but no recent tarry stools to suggest significant recent GI bleeding.  No chest pain or shortness of breath.  Pulse (!) 57, temperature 97.6 F (36.4 C), temperature source Oral, resp. rate 16, height 5\' 7"  (1.702 m), weight 65.8 kg (145 lb), SpO2 96 %. Physical Exam Pleasant, smoker's voice, no evident distress, chest clear, heart without murmur or arrhythmia, abdomen nontender, oropharynx benign.  No overt pallor, perhaps mild pallor present of the palms.  Assessment/Plan  This patient is at risk for recurrent anemia based on her documented vascular ectasia and need for ongoing antiplatelet therapy. 1.  We will start with small bowel endoscopy using an ultraslim colonoscope to help exclude proximal small bowel AVMs.  We will then switch to the gastroscope and treat her known gastric vascular malformations with argon plasma coagulator therapy.  The patient knows to watch for possible postprocedural  bleeding 2.  I will plan to obtain an updated CBC today Florencia Reasons, MD 08/29/2017, 8:09 AM

## 2017-08-29 NOTE — Transfer of Care (Signed)
Immediate Anesthesia Transfer of Care Note  Patient: Sara Dominguez [facility]  Procedure(s) Performed: HOT HEMOSTASIS (ARGON PLASMA COAGULATION/BICAP) (N/A ) ENTEROSCOPY (N/A )  Patient Location: Endoscopy Unit  Anesthesia Type:MAC  Level of Consciousness: awake and alert   Airway & Oxygen Therapy: Patient Spontanous Breathing and Patient connected to nasal cannula oxygen  Post-op Assessment: Report given to RN and Post -op Vital signs reviewed and stable  Post vital signs: Reviewed and stable  Last Vitals:  Vitals Value Taken Time  BP    Temp    Pulse    Resp    SpO2      Last Pain:  Vitals:   08/29/17 0737  TempSrc: Oral  PainSc: 0-No pain         Complications: No apparent anesthesia complications

## 2017-08-29 NOTE — Anesthesia Postprocedure Evaluation (Signed)
Anesthesia Post Note  Patient: Naryah Clenney [facility]  Procedure(s) Performed: HOT HEMOSTASIS (ARGON PLASMA COAGULATION/BICAP) (N/A ) ENTEROSCOPY (N/A )     Patient location during evaluation: PACU Anesthesia Type: MAC Level of consciousness: awake and alert Pain management: pain level controlled Vital Signs Assessment: post-procedure vital signs reviewed and stable Respiratory status: spontaneous breathing, nonlabored ventilation, respiratory function stable and patient connected to nasal cannula oxygen Cardiovascular status: stable and blood pressure returned to baseline Postop Assessment: no apparent nausea or vomiting Anesthetic complications: no    Last Vitals:  Vitals:   08/29/17 0902 08/29/17 0912  BP: (!) 120/92 118/64  Pulse: 77 65  Resp: 15 19  Temp: 36.8 C   SpO2: 100% 99%    Last Pain:  Vitals:   08/29/17 0912  TempSrc:   PainSc: 0-No pain                 Iysha Mishkin S

## 2017-08-30 ENCOUNTER — Encounter (HOSPITAL_COMMUNITY): Payer: Self-pay | Admitting: Gastroenterology

## 2017-11-05 ENCOUNTER — Other Ambulatory Visit: Payer: Self-pay | Admitting: *Deleted

## 2017-11-05 DIAGNOSIS — I739 Peripheral vascular disease, unspecified: Secondary | ICD-10-CM

## 2017-11-05 NOTE — Progress Notes (Signed)
vas 

## 2017-12-19 ENCOUNTER — Other Ambulatory Visit: Payer: Self-pay

## 2017-12-19 ENCOUNTER — Ambulatory Visit: Payer: Medicare Other | Attending: Physical Medicine and Rehabilitation | Admitting: Physical Therapy

## 2017-12-19 ENCOUNTER — Encounter: Payer: Self-pay | Admitting: Physical Therapy

## 2017-12-19 DIAGNOSIS — M6281 Muscle weakness (generalized): Secondary | ICD-10-CM | POA: Insufficient documentation

## 2017-12-19 DIAGNOSIS — M542 Cervicalgia: Secondary | ICD-10-CM | POA: Insufficient documentation

## 2017-12-19 DIAGNOSIS — R293 Abnormal posture: Secondary | ICD-10-CM | POA: Diagnosis present

## 2017-12-19 NOTE — Therapy (Addendum)
[facility] [address] [place], [place], [address] Phone: [phone]   Fax:  [phone]  Physical Therapy Evaluation  Patient Details  Name: Sara Dominguez MRN: [id number] Date of Birth: 1940-05-16 Referring Provider (PT): Newman Pies, MD   Encounter Date: 12/19/2017  PT End of Session - 12/19/17 0901    Visit Number  1    Date for PT Re-Evaluation  01/30/18    Authorization Type  [employer]    PT Start Time  0848    PT Stop Time  0926    PT Time Calculation (min)  38 min    Activity Tolerance  Patient tolerated treatment well    Behavior During Therapy  [facility] for tasks assessed/performed       Past Medical History:  Diagnosis Date  . AAA (abdominal aortic aneurysm), small, history of 2.3 X 2.4 07/06/2011  . Abnormal ankle brachial index 07/06/2011  . Anxiety   . CAD (coronary artery disease), with LAD stent in 2006, chronically occluded OM branch with hx. of Stent to RCA  07/06/2011  . Claudication in peripheral vascular disease, life style limiting 07/06/2011  . Hyperlipidemia LDL goal < 70 09/08/2012  . Hypertension   . PAD (peripheral artery disease) ([place])     Past Surgical History:  Procedure Laterality Date  . ABDOMINAL AORTAGRAM N/A 07/05/2011   Procedure: ABDOMINAL Maxcine Ham;  Surgeon: Lorretta Harp, MD;  Location: [facility] CATH LAB;  Service: Cardiovascular;  Laterality: N/A;  . ABDOMINAL HYSTERECTOMY  1992  . CATARACT EXTRACTION W/ INTRAOCULAR LENS  IMPLANT, BILATERAL Bilateral ~ 2006  . CORONARY ANGIOPLASTY WITH STENT PLACEMENT  2006; 2009; 2013   "1 + 1 + 2";  total of 4"  . ENTEROSCOPY N/A 08/29/2017   Procedure: ENTEROSCOPY;  Surgeon: Ronald Lobo, MD;  Location: [facility] ENDOSCOPY;  Service: Endoscopy;  Laterality: N/A;  . ESOPHAGOGASTRODUODENOSCOPY (EGD) WITH PROPOFOL N/A 06/07/2016   Procedure: ESOPHAGOGASTRODUODENOSCOPY (EGD) WITH PROPOFOL;  Surgeon: Ronald Lobo, MD;  Location: [facility] ENDOSCOPY;   Service: Endoscopy;  Laterality: N/A;  . FEMORAL ARTERY STENT Right 2006; 07/05/2011; 05/14/2013  . FEMORAL ARTERY STENT Right 05/14/13   between previous stens  . HOT HEMOSTASIS N/A 08/29/2017   Procedure: HOT HEMOSTASIS (ARGON PLASMA COAGULATION/BICAP);  Surgeon: Ronald Lobo, MD;  Location: [facility] ENDOSCOPY;  Service: Endoscopy;  Laterality: N/A;  . LOWER EXTREMITY ANGIOGRAM N/A 07/05/2011   Procedure: LOWER EXTREMITY ANGIOGRAM;  Surgeon: Lorretta Harp, MD;  Location: [facility] CATH LAB;  Service: Cardiovascular;  Laterality: N/A;  . LOWER EXTREMITY ANGIOGRAM N/A 05/14/2013   Procedure: LOWER EXTREMITY ANGIOGRAM;  Surgeon: Lorretta Harp, MD;  Location: [facility] CATH LAB;  Service: Cardiovascular;  Laterality: N/A;  . [place];; 1982  . PERCUTANEOUS STENT INTERVENTION Right 07/05/2011   Procedure: PERCUTANEOUS STENT INTERVENTION;  Surgeon: Lorretta Harp, MD;  Location: [facility] CATH LAB;  Service: Cardiovascular;  Laterality: Right;  . PERIPHERAL VASCULAR CATHETERIZATION N/A 11/04/2014   Procedure: Lower Extremity Angiography;  Surgeon: Lorretta Harp, MD;  Location: [place] CV LAB;  Service: Cardiovascular;  Laterality: N/A;  . PERIPHERAL VASCULAR CATHETERIZATION Right 11/04/2014   Procedure: Peripheral Vascular Intervention;  Surgeon: Lorretta Harp, MD;  Location: [place] CV LAB;  Service: Cardiovascular;  Laterality: Right;  SFA  . SHOULDER OPEN ROTATOR CUFF REPAIR Left ~ 2004    There were no vitals filed for this visit.   Subjective Assessment - 12/19/17 0856    Subjective  Pt reports  she has had neck pain for 2 years.  Most recently flare up lasted for 2 weeks which is why she went to see the doctor.  Pt states is has since felt better but wants to make sure.  I haven't been having much pain for the past month.    Limitations  Other (comment)   turng head hurts sometimes   Patient Stated Goals  learn what is causing pain and possibly how to prevent    Currently in Pain?   Yes   when having pain but is currently not hurting   Pain Score  7     Pain Location  Neck    Pain Orientation  Mid;Lower    Pain Descriptors / Indicators  Aching    Pain Type  Chronic pain    Pain Radiating Towards  down into the shoulder and into the left arm when it was bad; weakness in left arm    Pain Onset  More than a month ago    Pain Frequency  Intermittent    Aggravating Factors   turning head a certain way    Pain Relieving Factors  tylenol, heat    Effect of Pain on Daily Activities  lifting    Multiple Pain Sites  No         [facility] PT Assessment - 12/19/17 0001      Assessment   Medical Diagnosis  M54.2 (ICD-10-CM) - Cervicalgia;M54.2,G89.29 (ICD-10-CM) - Chronic neck pain    Referring Provider (PT)  Newman Pies, MD    Onset Date/Surgical Date  --   a couple years ago   Hand Dominance  Right    Prior Therapy  No not for neck; had RTC surgery and PT after that      Precautions   Precautions  None      Restrictions   Weight Bearing Restrictions  No      Balance Screen   Has the patient fallen in the past 6 months  No      [place] residence    Living Arrangements  Spouse/significant other      Prior Function   Level of Independence  Independent    Leisure  water aerobic      Cognition   Overall Cognitive Status  Within Functional Limits for tasks assessed      Observation/Other Assessments   Focus on Therapeutic Outcomes (FOTO)   31% limited      Posture/Postural Control   Posture/Postural Control  Postural limitations    Postural Limitations  Rounded Shoulders      ROM / Strength   AROM / PROM / Strength  AROM;Strength      AROM   AROM Assessment Site  Cervical    Cervical - Right Rotation  58    Cervical - Left Rotation  45      Strength   Overall Strength Comments  Lt shoulder 4+/5; Rt shoulder 5/5      Palpation   Palpation comment  upper traps,levator scap, cervical extensors very tight and  elevated tone      Ambulation/Gait   Gait Pattern  Within Functional Limits                Objective measurements completed on examination: See above findings.      [facility] Adult PT Treatment/Exercise - 12/19/17 0001      Self-Care   Self-Care  Other Self-Care Comments    Other Self-Care Comments  educated in [place] and posture             PT Education - 12/19/17 [place]    Education Details   Access Code: [email or web address]     Person(s) Educated  Patient    Methods  Explanation;Demonstration;Handout;Verbal cues;Tactile cues    Comprehension  Verbalized understanding;Returned demonstration          PT Long Term Goals - 12/19/17 0937      PT LONG TERM GOAL #1   Title  Pt will be ind with HEP    Time  6    Period  Weeks    Status  New    Target Date  01/30/18      PT LONG TERM GOAL #2   Title  Pt will continue to have no increased pain due to knowledge of correct posture during functional activities.    Time  6    Period  Weeks    Status  New    Target Date  01/30/18      PT LONG TERM GOAL #3   Time  6    Period  Weeks    Status  New    Target Date  01/30/18             Plan - 12/19/17 1521    Clinical Impression Statement  Pt arrives at clinic today due to chronic neck pain that occasionally flares up.  She demonstrates rounded shoulder and increased thoracic kyphosis.  Pt has overactive upper traps and SCM muscles.  Pt has tightness throughout cervical muscles .  She demonstrates some UE weakness. She aslo uses upper traps with shoulder elevation.  Pt has decreased cervical AROM.  Pt will benefit from skilled PT to address these impairments for reduced risk of injury.    History and Personal Factors relevant to plan of care:  chronic pain    Clinical Presentation  Stable    Clinical Presentation due to:  pt is stable    Clinical Decision Making  Low    Rehab Potential  Excellent    PT Frequency  1x / week   pt will call back for appointment   PT  Duration  6 weeks    PT Treatment/Interventions  ADLs/Self Care Home Management;Therapeutic activities;Therapeutic exercise;Neuromuscular re-education;Manual techniques;Moist Heat;Electrical Stimulation;Cryotherapy;Dry needling;Taping    PT Next Visit Plan  STM to cervical region, review HEP and posture, progress postural and shoulder strength    Recommended Other Services  eval 11/7    Consulted and Agree with Plan of Care  Patient       Patient will benefit from skilled therapeutic intervention in order to improve the following deficits and impairments:  Pain, Postural dysfunction, Decreased range of motion, Decreased strength  Visit Diagnosis: Cervicalgia  Abnormal posture  Muscle weakness (generalized)     Problem List Patient Active Problem List   Diagnosis Date Noted  . Anemia, iron deficiency 07/03/2017  . GI bleed 06/06/2016  . Acute blood loss anemia 06/06/2016  . Carotid artery disease ([place]) 12/29/2015  . Claudication ([place]) 05/14/2013  . Essential hypertension 04/28/2013  . Hyperlipidemia with target LDL less than 70 09/08/2012  . Claudication in peripheral vascular disease, life style limiting 07/06/2011  . Abnormal ankle brachial index 07/06/2011  . PAD Rt SFA stent 2006 with ISR 5/13. New RSFA stent 05/14/13 07/06/2011  . CAD- LAD stent in 2006, chronically occluded OM branch with hx. of Stent to RCA  07/06/2011  . AAA (abdominal aortic aneurysm), small,  history of 2.3 X 2.4 07/06/2011    Zannie Cove, PT 12/19/2017, 4:49 PM  [facility] [address], [place], [place], [address] Phone: [phone]   Fax:  [phone]  Name: KALENE CUTLER MRN: [id number] Date of Birth: [birth year]  PHYSICAL THERAPY DISCHARGE SUMMARY  Visits from Start of Care: 1  Current functional level related to goals / functional outcomes: See above, eval only   Remaining deficits: See above   Education /  Equipment: HEP Plan: Patient agrees to discharge.  Patient goals were not met. Patient is being discharged due to not returning since the last visit.  ?????     Google, PT 01/14/18 9:48 AM

## 2017-12-19 NOTE — Patient Instructions (Signed)
Access Code: [email or web address]  URL: [email or web address]  Date: 12/19/2017  Prepared by: Dorie Rank   Exercises  Seated Cervical Retraction - 10 reps - 1 sets - 2x daily - 7x weekly  Standing Scapular Retraction - 10 reps - 1 sets - 5 sec hold - 2x daily - 7x weekly  Standing Scapular Retraction in Abduction - 10 reps - 1 sets - 2x daily - 7x weekly  Shoulder External Rotation and Scapular Retraction with Resistance - 10 reps - 3 sets - 2x daily - 7x weekly  Scapular Retraction with Resistance - 10 reps - 3 sets - 2x daily - 7x weekly  Scapular Retraction with Resistance Advanced - 10 reps - 3 sets - 2x daily - 7x weekly

## 2017-12-23 ENCOUNTER — Encounter: Payer: Self-pay | Admitting: Physician Assistant

## 2017-12-23 ENCOUNTER — Ambulatory Visit: Payer: Medicare Other | Admitting: Physician Assistant

## 2017-12-23 VITALS — BP 146/80 | HR 64 | Ht 67.0 in | Wt 150.0 lb

## 2017-12-23 DIAGNOSIS — I10 Essential (primary) hypertension: Secondary | ICD-10-CM | POA: Diagnosis not present

## 2017-12-23 DIAGNOSIS — E785 Hyperlipidemia, unspecified: Secondary | ICD-10-CM | POA: Diagnosis not present

## 2017-12-23 DIAGNOSIS — I2510 Atherosclerotic heart disease of native coronary artery without angina pectoris: Secondary | ICD-10-CM | POA: Diagnosis not present

## 2017-12-23 DIAGNOSIS — I739 Peripheral vascular disease, unspecified: Secondary | ICD-10-CM

## 2017-12-23 NOTE — Progress Notes (Signed)
Cardiology Office Note   Date:  12/23/2017   ID:  Sara Dominguez, DOB December 24, 1940, MRN [id number]  PCP:  [name], MD Cardiologist:  Nanetta Batty, MD 07/12/2017 Theodore Demark, PA-C   Chief Complaint  Patient presents with  . Follow-up    73m check up    History of Present Illness: Sara Dominguez is a 77 y.o. female with a history of LAD stent 2006, R SFA stent 2006, 2013 & 2016, LAD ok, DES RCA & OM 100% 2011, DES RCA x 2 at [facility] 08/2011, gastric ulcer 2018, HTN, HLD  Admitted 06/2017 with symptomatic anemia, small bowel enteroscopy 08/29/2017 by Dr. [name] showed 3 AVMs that were treated and Plavix was resumed  [name] presents for cardiology follow up  She has black stools because of iron supplements. Is to see Dr [name] this week.  She does water aerobics and walks for exercise.  She is active around the house and yard.  She denies any chest pain or dyspnea with exertion.    She does not get lightheaded or dizzy.  No palpitations.  Occasional LE cramping at night. Not that often.   She is not getting pain in her legs or chest when she walks.   She has not yet taken her medication today, is compliant.  She has not eaten yet today and will take them when she eats.  She has not restarted tobacco.  She does not have palpitations, no presyncope or syncope.   Past Medical History:  Diagnosis Date  . AAA (abdominal aortic aneurysm), small, history of 2.3 X 2.4 07/06/2011  . Abnormal ankle brachial index 07/06/2011  . Anxiety   . CAD (coronary artery disease), with LAD stent in 2006, chronically occluded OM branch with hx. of Stent to RCA  07/06/2011  . Claudication in peripheral vascular disease, life style limiting 07/06/2011  . Hyperlipidemia LDL goal < 70 09/08/2012  . Hypertension   . PAD (peripheral artery disease) ([facility])     Past Surgical History:  Procedure Laterality Date  . ABDOMINAL AORTAGRAM N/A 07/05/2011   Procedure: ABDOMINAL  Ronny Flurry;  Surgeon: Runell Gess, MD;  Location: [facility] CATH LAB;  Service: Cardiovascular;  Laterality: N/A;  . ABDOMINAL HYSTERECTOMY  1992  . CATARACT EXTRACTION W/ INTRAOCULAR LENS  IMPLANT, BILATERAL Bilateral ~ 2006  . CORONARY ANGIOPLASTY WITH STENT PLACEMENT  2006; 2009; 2013   "1 + 1 + 2";  total of 4"  . ENTEROSCOPY N/A 08/29/2017   Procedure: ENTEROSCOPY;  Surgeon: Bernette Redbird, MD;  Location: [facility] ENDOSCOPY;  Service: Endoscopy;  Laterality: N/A;  . ESOPHAGOGASTRODUODENOSCOPY (EGD) WITH PROPOFOL N/A 06/07/2016   Procedure: ESOPHAGOGASTRODUODENOSCOPY (EGD) WITH PROPOFOL;  Surgeon: Bernette Redbird, MD;  Location: [facility] ENDOSCOPY;  Service: Endoscopy;  Laterality: N/A;  . FEMORAL ARTERY STENT Right 2006; 07/05/2011; 05/14/2013  . FEMORAL ARTERY STENT Right 05/14/13   between previous stens  . HOT HEMOSTASIS N/A 08/29/2017   Procedure: HOT HEMOSTASIS (ARGON PLASMA COAGULATION/BICAP);  Surgeon: Bernette Redbird, MD;  Location: [facility] ENDOSCOPY;  Service: Endoscopy;  Laterality: N/A;  . LOWER EXTREMITY ANGIOGRAM N/A 07/05/2011   Procedure: LOWER EXTREMITY ANGIOGRAM;  Surgeon: Runell Gess, MD;  Location: [facility] CATH LAB;  Service: Cardiovascular;  Laterality: N/A;  . LOWER EXTREMITY ANGIOGRAM N/A 05/14/2013   Procedure: LOWER EXTREMITY ANGIOGRAM;  Surgeon: Runell Gess, MD;  Location: [facility] CATH LAB;  Service: Cardiovascular;  Laterality: N/A;  . LUMBAR DISC SURGERY  1980;; 1982  . PERCUTANEOUS STENT  INTERVENTION Right 07/05/2011   Procedure: PERCUTANEOUS STENT INTERVENTION;  Surgeon: Runell Gess, MD;  Location: [facility] CATH LAB;  Service: Cardiovascular;  Laterality: Right;  . PERIPHERAL VASCULAR CATHETERIZATION N/A 11/04/2014   Procedure: Lower Extremity Angiography;  Surgeon: Runell Gess, MD;  Location: [facility] INVASIVE CV LAB;  Service: Cardiovascular;  Laterality: N/A;  . PERIPHERAL VASCULAR CATHETERIZATION Right 11/04/2014   Procedure: Peripheral Vascular Intervention;  Surgeon: Runell Gess, MD;   Location: [facility] INVASIVE CV LAB;  Service: Cardiovascular;  Laterality: Right;  SFA  . SHOULDER OPEN ROTATOR CUFF REPAIR Left ~ 2004    Current Outpatient Medications  Medication Sig Dispense Refill  . atorvastatin (LIPITOR) 40 MG tablet Take 1 tablet (40 mg total) by mouth daily. Please schedule appointment for refills. 90 tablet 1  . cholecalciferol (VITAMIN D) 1000 units tablet Take 1,000 Units by mouth daily.    . clopidogrel (PLAVIX) 75 MG tablet Take 1 tablet (75 mg total) by mouth daily. 90 tablet 3  . ferrous sulfate 325 (65 FE) MG tablet Take 325 mg by mouth daily with breakfast.    . Glucosamine 500 MG CAPS Take 500 mg by mouth daily.     Marland Kitchen losartan (COZAAR) 100 MG tablet Take 1 tablet (100 mg total) by mouth daily. 30 tablet 0  . Omega-3 Fatty Acids (FISH OIL) 1000 MG CAPS Take 1,000 mg by mouth daily.     . pantoprazole (PROTONIX) 40 MG tablet Take 1 tablet (40 mg total) by mouth 2 (two) times daily. (Patient taking differently: Take 40 mg by mouth daily. ) 60 tablet 0   No current facility-administered medications for this visit.     Allergies:   Patient has no known allergies.    Social History:  The patient  reports that she has been smoking cigarettes and e-cigarettes. She has a 50.00 pack-year smoking history. She has never used smokeless tobacco. She reports that she drinks about 3.0 standard drinks of alcohol per week. She reports that she does not use drugs.   Family History:  The patient's family history includes Cancer (age of onset: 69) in her mother; Deep vein thrombosis in her daughter; Heart attack in her father and maternal grandmother; Heart attack (age of onset: 40) in her maternal grandfather; Heart disease in her sister; Parkinson's disease in her sister; Stroke in her paternal grandfather.  She indicated that her mother is deceased. She indicated that her father is deceased. She indicated that only one of her two sisters is alive. She indicated that her maternal  grandmother is deceased. She indicated that her maternal grandfather is deceased. She indicated that her paternal grandmother is deceased. She indicated that her paternal grandfather is deceased. She indicated that her daughter is alive.    ROS:  Please see the history of present illness. All other systems are reviewed and negative.    PHYSICAL EXAM: VS:  BP (!) 146/80   Pulse 64   Ht 5\' 7"  (1.702 m)   Wt 150 lb (68 kg)   BMI 23.49 kg/m  , BMI Body mass index is 23.49 kg/m. GEN: Well nourished, well developed, female in no acute distress HEENT: normal for age  Neck: no JVD, no carotid bruit, no masses Cardiac: RRR; soft murmur, no rubs, or gallops Respiratory: Scattered rales bilaterally, normal work of breathing GI: soft, nontender, nondistended, + BS MS: no deformity or atrophy; no edema; distal pulses are 2+ in upper extremities, decreased but palpable in both lower extremities Skin: warm  and dry, no rash Neuro:  Strength and sensation are intact Psych: euthymic mood, full affect   EKG:  EKG is ordered today. The ekg ordered today demonstrates sinus rhythm, underlying sinus bradycardia with heart rate 64 secondary to brief tachycardia, question atrial tach versus 2 PACs in a row  ECHO: 07/04/2017 - Left ventricle: The cavity size was normal. There was mild   concentric hypertrophy. Systolic function was normal. The   estimated ejection fraction was in the range of 55% to 60%. Wall   motion was normal; there were no regional wall motion   abnormalities. - Aortic root: The aortic root was mildly dilated. - Mitral valve: There was mild to moderate regurgitation. - Left atrium: The atrium was mildly dilated.  ABIs:  Bilateral ABIs appear essentially unchanged compared to prior study on 06/2017.  Final Interpretation: Right: Resting right ankle-brachial index indicates moderate right lower extremity arterial disease. The right toe-brachial index is normal.  Left: Resting  left ankle-brachial index is within normal range. No evidence of significant left lower extremity arterial disease. The left toe-brachial index is abnormal.  See arterial duplex report.   Recent Labs: 07/03/2017: TSH 1.163 07/05/2017: ALT 12; BUN 14; Creatinine, Ser 0.76; Magnesium 2.2; Potassium 4.0; Sodium 141 08/29/2017: Hemoglobin 10.8; Platelets 289  CBC    Component Value Date/Time   WBC 7.5 08/29/2017 0910   RBC 3.93 08/29/2017 0910   HGB 10.8 (L) 08/29/2017 0910   HCT 34.0 (L) 08/29/2017 0910   PLT 289 08/29/2017 0910   MCV 86.5 08/29/2017 0910   MCH 27.5 08/29/2017 0910   MCHC 31.8 08/29/2017 0910   RDW 24.9 (H) 08/29/2017 0910   LYMPHSABS 2.2 07/05/2017 0410   MONOABS 0.8 07/05/2017 0410   EOSABS 0.2 07/05/2017 0410   BASOSABS 0.1 07/05/2017 0410   CMP Latest Ref Rng & Units 07/05/2017 07/04/2017 07/03/2017  Glucose 65 - 99 mg/dL 95 95 829(F)  BUN 6 - 20 mg/dL 14 17 17   Creatinine 0.44 - 1.00 mg/dL 6.21 3.08 6.57  Sodium 135 - 145 mmol/L 141 140 138  Potassium 3.5 - 5.1 mmol/L 4.0 4.1 3.6  Chloride 101 - 111 mmol/L 107 106 106  CO2 22 - 32 mmol/L 25 23 22   Calcium 8.9 - 10.3 mg/dL 9.3 9.2 9.3  Total Protein 6.5 - 8.1 g/dL 6.8 7.0 -  Total Bilirubin 0.3 - 1.2 mg/dL 0.5 0.7 -  Alkaline Phos 38 - 126 U/L 62 63 -  AST 15 - 41 U/L 16 17 -  ALT 14 - 54 U/L 12(L) 11(L) -     Lipid Panel    Component Value Date/Time   CHOL 128 05/07/2013 0934   TRIG 71 05/07/2013 0934   HDL 57 05/07/2013 0934   CHOLHDL 2.2 05/07/2013 0934   VLDL 14 05/07/2013 0934   LDLCALC 57 05/07/2013 0934     Wt Readings from Last 3 Encounters:  12/23/17 150 lb (68 kg)  08/29/17 145 lb (65.8 kg)  07/12/17 145 lb 3.2 oz (65.9 kg)     Other studies Reviewed: Additional studies/ records that were reviewed today include: Office notes, hospital records and testing.  ASSESSMENT AND PLAN:  1.  PAD: She is not having any claudication symptoms. -Continue Plavix and Lipitor.  2.   Hyperlipidemia, goal LDL less than 70: Her LDL was 62 when checked by her PCP 08/2017, continue statin  3.  Hypertension: Her blood pressure is elevated today but she has not yet taken her losartan 100 mg. -  Continue this, she assures me she is compliant and takes her medications daily  4.  CAD: She exercises regularly and has no ischemic symptoms.  She is on Plavix, ARB and a statin. -No ischemic testing indicated at this time -No beta-blocker due to resting bradycardia   Current medicines are reviewed at length with the patient today.  The patient does not have concerns regarding medicines.  The following changes have been made:  no change  Labs/ tests ordered today include:  No orders of the defined types were placed in this encounter.    Disposition:   FU with Nanetta Batty, MD in 1 year  Signed, Theodore Demark, PA-C  12/23/2017 1:10 PM    [facility] Phone: [phone]; Fax: [phone]  This note was written with the assistance of speech recognition software.  Please excuse any transcriptional errors.

## 2017-12-23 NOTE — Patient Instructions (Signed)

## 2017-12-26 ENCOUNTER — Other Ambulatory Visit: Payer: Self-pay | Admitting: Cardiovascular Disease

## 2018-03-27 ENCOUNTER — Other Ambulatory Visit: Payer: Self-pay | Admitting: Family Medicine

## 2018-03-27 DIAGNOSIS — Z1231 Encounter for screening mammogram for malignant neoplasm of breast: Secondary | ICD-10-CM

## 2018-04-25 ENCOUNTER — Ambulatory Visit: Payer: Medicare Other

## 2018-05-07 ENCOUNTER — Ambulatory Visit: Payer: Medicare Other

## 2018-06-05 ENCOUNTER — Ambulatory Visit: Payer: Medicare Other

## 2018-07-23 ENCOUNTER — Ambulatory Visit
Admission: RE | Admit: 2018-07-23 | Discharge: 2018-07-23 | Disposition: A | Discharge status: Home / Self Care | Payer: Medicare Other | Source: Ambulatory Visit | Attending: Family Medicine | Admitting: Family Medicine

## 2018-07-23 ENCOUNTER — Other Ambulatory Visit: Payer: Self-pay

## 2018-07-23 DIAGNOSIS — Z1231 Encounter for screening mammogram for malignant neoplasm of breast: Secondary | ICD-10-CM

## 2018-07-24 ENCOUNTER — Other Ambulatory Visit: Payer: Self-pay | Admitting: Family Medicine

## 2018-07-24 DIAGNOSIS — R928 Other abnormal and inconclusive findings on diagnostic imaging of breast: Secondary | ICD-10-CM

## 2018-07-30 ENCOUNTER — Ambulatory Visit
Admission: RE | Admit: 2018-07-30 | Discharge: 2018-07-30 | Disposition: A | Discharge status: Home / Self Care | Payer: Medicare Other | Source: Ambulatory Visit | Attending: Family Medicine | Admitting: Family Medicine

## 2018-07-30 ENCOUNTER — Ambulatory Visit: Payer: Medicare Other

## 2018-07-30 ENCOUNTER — Other Ambulatory Visit: Payer: Self-pay

## 2018-07-30 DIAGNOSIS — R928 Other abnormal and inconclusive findings on diagnostic imaging of breast: Secondary | ICD-10-CM

## 2018-08-07 ENCOUNTER — Other Ambulatory Visit (HOSPITAL_COMMUNITY): Payer: Self-pay | Admitting: Cardiovascular Disease

## 2018-08-07 ENCOUNTER — Other Ambulatory Visit: Payer: Self-pay

## 2018-08-07 ENCOUNTER — Ambulatory Visit (HOSPITAL_COMMUNITY)
Admission: RE | Admit: 2018-08-07 | Discharge: 2018-08-07 | Disposition: A | Discharge status: Home / Self Care | Payer: Medicare Other | Source: Ambulatory Visit | Attending: Internal Medicine | Admitting: Internal Medicine

## 2018-08-07 DIAGNOSIS — I739 Peripheral vascular disease, unspecified: Secondary | ICD-10-CM | POA: Insufficient documentation

## 2018-08-07 DIAGNOSIS — Z95820 Peripheral vascular angioplasty status with implants and grafts: Secondary | ICD-10-CM

## 2018-08-12 ENCOUNTER — Encounter: Payer: Self-pay | Admitting: Cardiovascular Disease

## 2018-08-12 ENCOUNTER — Other Ambulatory Visit: Payer: Self-pay

## 2018-08-12 DIAGNOSIS — I739 Peripheral vascular disease, unspecified: Secondary | ICD-10-CM

## 2018-11-21 ENCOUNTER — Other Ambulatory Visit: Payer: Self-pay | Admitting: Physician Assistant

## 2018-11-21 DIAGNOSIS — M8580 Other specified disorders of bone density and structure, unspecified site: Secondary | ICD-10-CM

## 2018-12-02 ENCOUNTER — Other Ambulatory Visit: Payer: Self-pay | Admitting: Cardiovascular Disease

## 2018-12-23 ENCOUNTER — Encounter: Payer: Self-pay | Admitting: Cardiovascular Disease

## 2018-12-23 ENCOUNTER — Ambulatory Visit: Payer: Medicare Other | Admitting: Cardiovascular Disease

## 2018-12-23 ENCOUNTER — Other Ambulatory Visit: Payer: Self-pay

## 2018-12-23 VITALS — BP 146/60 | HR 63 | Temp 96.8°F | Ht 67.0 in | Wt 153.0 lb

## 2018-12-23 DIAGNOSIS — I739 Peripheral vascular disease, unspecified: Secondary | ICD-10-CM | POA: Diagnosis not present

## 2018-12-23 DIAGNOSIS — I2510 Atherosclerotic heart disease of native coronary artery without angina pectoris: Secondary | ICD-10-CM

## 2018-12-23 DIAGNOSIS — I10 Essential (primary) hypertension: Secondary | ICD-10-CM

## 2018-12-23 DIAGNOSIS — E785 Hyperlipidemia, unspecified: Secondary | ICD-10-CM

## 2018-12-23 DIAGNOSIS — I779 Disorder of arteries and arterioles, unspecified: Secondary | ICD-10-CM | POA: Diagnosis not present

## 2018-12-23 DIAGNOSIS — I6523 Occlusion and stenosis of bilateral carotid arteries: Secondary | ICD-10-CM | POA: Diagnosis not present

## 2018-12-23 NOTE — Progress Notes (Signed)
12/23/2018 Sara Dominguez [facility]   [birth year]  [id number]  Primary Physician [name], MD Primary Cardiologist: Runell Gess MD FACP, [place], [place], [place]  HPI:  Sara Dominguez is a 78 y.o.  thin-appearing, married Caucasian female, mother of 2, grandmother to 3 grandchildren who I last saw in the office 07/12/2017.Marland Kitchen She has a history of CAD status post LAD stenting by myself in 2006, as well as right SFA stenting several months thereafter. She just stopped smoking in July of last year. I catheterized her August 05, 2009, revealing a patent LAD stent and a chronically occluded OM branch with high-grade mid RCA lesion which I stented with a Taxus Ion drug-eluting stent. At that time, I demonstrated a small abdominal aortic aneurysm measuring 2.3 x 2.4 cm. In addition, she developed recurrent lower extremity pain with Dopplers that showed a decrease in the right ABI from 0.99 to 0.75 with a high frequency signal in her mid right SFA. I angiogram'd her Jul 05, 2011, revealing proximal right SFA lesion along with in-stent restenosis. I revascularized her percutaneously which resulted in marked improvement in her ability to ambulate and in her Dopplers. She did develop arm pain while at [facility] in July of last year and was cath'd at [facility] by Dr. Ellis Parents radially who placed 2 drug-eluting stents in the right coronary artery.   Because of recurrent claudication and abnormal Doppler studies I re-angiogramed to her 05/14/13 revealing high-grade disease in the mid right SFA between 2 previously placed stents. I restarted her with a 6 mm x 100 mm long Viabahn covered stent with excellent angiographic and clinical result. Her follow-up lower extremity arterial Doppler studies performed 12/04/13 revealed ABIs of 1 bilaterally with a widely patent stent. Since I saw her last she is complaining of some right lateral thigh pain and tingling in her right foot as well as dysesthesia in both upper  extremities. She does have a palpable pedal pulse on the right side. I performed a lower extremity ultrasound on her 09/27/14 revealing a high-frequency signal in the mid right SFA with a decline in her right ABI from 1.0 -.79. I angiogramed her 11/04/14 revealing a 95% stenosis of the leading edge of the previously placed covered stent which I restented using a 6 mm x 25 mm longViabahncovered stentrecent lower extremity arterial Doppler studies performed 07/25/16 revealed widely patent stents in her right SFA with ABIs of approximately 1 bilaterally. She denies claudication. In addition, she denies chest pain or shortness of breath. She did have endoscopy performed back in April because of anemia and a positive occult fecal blood test and this demonstrated a gastric ulcer. Her antiplatelet locations where held for a short period of time.  She was admitted to the hospital with of breath and lower extremity weakness.  2D echo was essentially normal.  Her hemoglobin was in the low 7 range and she was transfused 2 units of packed red blood cells which improved her shortness of breath and claudication.  Dopplers did however show a decline in her right ABI down to the .79 range similar to prior Dopplers when she had restenosed on that side although she currently denies claudication.  Since I saw her a year and a half ago she has remained stable.  She denies chest pain or shortness of breath.  She did have lower extremity arterial Doppler studies performed 08/07/2018 suggesting widely patent right SFA stents.  She denies claudication.   Current Meds  Medication Sig  . atorvastatin (LIPITOR) 40 MG tablet Take 1 tablet (40 mg total) by mouth daily. Please schedule appointment for refills.  . cholecalciferol (VITAMIN D) 1000 units tablet Take 1,000 Units by mouth daily.  . clopidogrel (PLAVIX) 75 MG tablet TAKE 1 TABLET BY MOUTH  DAILY  . ferrous sulfate 325 (65 FE) MG tablet Take 325 mg by mouth daily with  breakfast.  . losartan (COZAAR) 100 MG tablet Take 1 tablet (100 mg total) by mouth daily.  . Omega-3 Fatty Acids (FISH OIL) 1000 MG CAPS Take 1,000 mg by mouth daily.      No Known Allergies  Social History   Socioeconomic History  . Marital status: Married    Spouse name: Not on file  . Number of children: Not on file  . Years of education: Not on file  . Highest education level: Not on file  Occupational History  . Not on file  Social Needs  . Financial resource strain: Not on file  . Food insecurity    Worry: Not on file    Inability: Not on file  . Transportation needs    Medical: Not on file    Non-medical: Not on file  Tobacco Use  . Smoking status: Current Every Day Smoker    Packs/day: 1.00    Years: 50.00    Pack years: 50.00    Types: Cigarettes, E-cigarettes    Last attempt to quit: 07/14/2010    Years since quitting: 8.4  . Smokeless tobacco: Never Used  . Tobacco comment: Resumed smoking 8 months ago, smokes approximately 7 cigarettes daily.. Counseling given  Substance and Sexual Activity  . Alcohol use: Yes    Alcohol/week: 3.0 standard drinks    Types: 3 Glasses of wine per week    Comment: 05/14/2013  "socially; 2-3 glasses of wine twice/wk"  . Drug use: No  . Sexual activity: Not Currently  Lifestyle  . Physical activity    Days per week: Not on file    Minutes per session: Not on file  . Stress: Not on file  Relationships  . Social Musicianconnections    Talks on phone: Not on file    Gets together: Not on file    Attends religious service: Not on file    Active member of club or organization: Not on file    Attends meetings of clubs or organizations: Not on file    Relationship status: Not on file  . Intimate partner violence    Fear of current or ex partner: Not on file    Emotionally abused: Not on file    Physically abused: Not on file    Forced sexual activity: Not on file  Other Topics Concern  . Not on file  Social History Narrative  . Not  on file     Review of Systems: General: negative for chills, fever, night sweats or weight changes.  Cardiovascular: negative for chest pain, dyspnea on exertion, edema, orthopnea, palpitations, paroxysmal nocturnal dyspnea or shortness of breath Dermatological: negative for rash Respiratory: negative for cough or wheezing Urologic: negative for hematuria Abdominal: negative for nausea, vomiting, diarrhea, bright red blood per rectum, melena, or hematemesis Neurologic: negative for visual changes, syncope, or dizziness All other systems reviewed and are otherwise negative except as noted above.    Blood pressure (!) 146/60, pulse 63, temperature (!) 96.8 F (36 C), height 5\' 7"  (1.702 m), weight 153 lb (69.4 kg).  General appearance: alert and no distress Neck:  no adenopathy, no carotid bruit, no JVD, supple, symmetrical, trachea midline and thyroid not enlarged, symmetric, no tenderness/mass/nodules Lungs: clear to auscultation bilaterally Heart: regular rate and rhythm, S1, S2 normal, no murmur, click, rub or gallop Extremities: extremities normal, atraumatic, no cyanosis or edema Pulses: 2+ and symmetric Skin: Skin color, texture, turgor normal. No rashes or lesions Neurologic: Alert and oriented X 3, normal strength and tone. Normal symmetric reflexes. Normal coordination and gait  EKG sinus rhythm at 63 with PACs and sinus arrhythmia with occasional PVCs.  I personally reviewed this EKG.  ASSESSMENT AND PLAN:   Claudication in peripheral vascular disease, life style limiting History of peripheral arterial disease status post right SFA intervention by myself back in 2006.  She has had multiple reinterventions since that time for de novo lesions and in-stent restenosis most recently 11/04/2014 with a 95% leading edge lesion which I restented using a 6 mm x 25 mm Viabahn covered stent.  Her most recent Doppler studies performed 08/07/2018 revealed a widely patent right SFA with ABIs of  0.89 on the right and 0.88 on the left.  She denies claudication.  We will repeat Doppler studies in 12 months  CAD- LAD stent in 2006, chronically occluded OM branch with hx. of Stent to RCA  History of CAD status post LAD stenting by myself back in 2006.  She has not had any intervention since.  She denies chest pain or shortness of breath.  Hyperlipidemia with target LDL less than 70 History of hyperlipidemia on statin therapy with lipid profile performed 12/05/2018 revealing a total cholesterol 152, LDL 78 and HDL of 62.  Essential hypertension History of essential hypertension with blood pressure measured today 146/60.  She is on losartan.      Lorretta Harp MD FACP,FACC,FAHA, [facility] 12/23/2018 9:26 AM

## 2018-12-23 NOTE — Assessment & Plan Note (Signed)
History of peripheral arterial disease status post right SFA intervention by myself back in 2006.  She has had multiple reinterventions since that time for de novo lesions and in-stent restenosis most recently 11/04/2014 with a 95% leading edge lesion which I restented using a 6 mm x 25 mm Viabahn covered stent.  Her most recent Doppler studies performed 08/07/2018 revealed a widely patent right SFA with ABIs of 0.89 on the right and 0.88 on the left.  She denies claudication.  We will repeat Doppler studies in 12 months

## 2018-12-23 NOTE — Assessment & Plan Note (Signed)
History of essential hypertension with blood pressure measured today 146/60.  She is on losartan.

## 2018-12-23 NOTE — Assessment & Plan Note (Signed)
History of CAD status post LAD stenting by myself back in 2006.  She has not had any intervention since.  She denies chest pain or shortness of breath.

## 2018-12-23 NOTE — Patient Instructions (Addendum)
Medication Instructions:  Your physician recommends that you continue on your current medications as directed. Please refer to the Current Medication list given to you today.  If you need a refill on your cardiac medications before your next appointment, please call your pharmacy.   Lab work: NONE  Testing/Procedures: Lower extremity dopplers with ABI in [place].  Follow-Up: At [facility], you and your health needs are our priority.  As part of our continuing mission to provide you with exceptional heart care, we have created designated Provider Care Teams.  These Care Teams include your primary Cardiologist (physician) and Advanced Practice Providers (APPs -  Physician Assistants and Nurse Practitioners) who all work together to provide you with the care you need, when you need it. You may see Quay Burow, MD or one of the following Advanced Practice Providers on your designated Care Team:    Kerin Ransom, PA-C  [place], [place]  Coletta Memos, [place]   Your physician wants you to follow-up in: [place]. You will receive a reminder letter in the mail two months in advance. If you don't receive a letter, please call our office to schedule the follow-up appointment.

## 2018-12-23 NOTE — Assessment & Plan Note (Signed)
History of hyperlipidemia on statin therapy with lipid profile performed 12/05/2018 revealing a total cholesterol 152, LDL 78 and HDL of 62.

## 2018-12-29 ENCOUNTER — Other Ambulatory Visit: Payer: Self-pay

## 2018-12-29 DIAGNOSIS — Z20822 Contact with and (suspected) exposure to covid-19: Secondary | ICD-10-CM

## 2018-12-31 LAB — NOVEL CORONAVIRUS, NAA: SARS-CoV-2, NAA: NOT DETECTED

## 2019-02-10 ENCOUNTER — Ambulatory Visit
Admission: RE | Admit: 2019-02-10 | Discharge: 2019-02-10 | Disposition: A | Discharge status: Home / Self Care | Payer: Medicare Other | Source: Ambulatory Visit | Attending: Physician Assistant | Admitting: Physician Assistant

## 2019-02-10 ENCOUNTER — Other Ambulatory Visit: Payer: Self-pay

## 2019-02-10 DIAGNOSIS — M8580 Other specified disorders of bone density and structure, unspecified site: Secondary | ICD-10-CM

## 2019-02-28 ENCOUNTER — Ambulatory Visit: Payer: Medicare Other | Attending: Internal Medicine

## 2019-02-28 DIAGNOSIS — Z23 Encounter for immunization: Secondary | ICD-10-CM | POA: Insufficient documentation

## 2019-02-28 NOTE — Progress Notes (Signed)
   Covid-19 Vaccination Clinic  Name:  BEONCA GIBB    MRN: [id number] DOB: [birth year]  02/28/2019  Ms. [place] was observed post Covid-19 immunization for 15 minutes without incidence. She was provided with Vaccine Information Sheet and instruction to access the V-Safe system.   Ms. Mucha was instructed to call 911 with any severe reactions post vaccine: Marland Kitchen Difficulty breathing  . Swelling of your face and throat  . A fast heartbeat  . A bad rash all over your body  . Dizziness and weakness    Immunizations Administered    Name Date Dose VIS Date Route   Pfizer COVID-19 Vaccine 02/28/2019  1:40 PM 0.3 mL 01/23/2019 Intramuscular   Manufacturer: [employer], [employer]   Lot: [id number]   NDC: [address]-2

## 2019-03-10 ENCOUNTER — Other Ambulatory Visit: Payer: Self-pay | Admitting: Gastroenterology

## 2019-03-10 DIAGNOSIS — R1314 Dysphagia, pharyngoesophageal phase: Secondary | ICD-10-CM

## 2019-03-17 ENCOUNTER — Ambulatory Visit
Admission: RE | Admit: 2019-03-17 | Discharge: 2019-03-17 | Disposition: A | Discharge status: Home / Self Care | Payer: Medicare Other | Source: Ambulatory Visit | Attending: Gastroenterology | Admitting: Gastroenterology

## 2019-03-17 DIAGNOSIS — R1314 Dysphagia, pharyngoesophageal phase: Secondary | ICD-10-CM

## 2019-03-20 ENCOUNTER — Ambulatory Visit: Payer: Medicare Other | Attending: Internal Medicine

## 2019-03-20 DIAGNOSIS — Z23 Encounter for immunization: Secondary | ICD-10-CM | POA: Insufficient documentation

## 2019-03-20 NOTE — Progress Notes (Signed)
   Covid-19 Vaccination Clinic  Name:  MURLE HELLSTROM    MRN: [id number] DOB: Jan 03, 1941  03/20/2019  Ms. Purewal was observed post Covid-19 immunization for 15 minutes without incidence. She was provided with Vaccine Information Sheet and instruction to access the V-Safe system.   Ms. Marsan was instructed to call 911 with any severe reactions post vaccine: Marland Kitchen Difficulty breathing  . Swelling of your face and throat  . A fast heartbeat  . A bad rash all over your body  . Dizziness and weakness    Immunizations Administered    Name Date Dose VIS Date Route   Pfizer COVID-19 Vaccine 03/20/2019  8:47 AM 0.3 mL 01/23/2019 Intramuscular   Manufacturer: [employer], [employer]   Lot: [id number]   NDC: [address]-9

## 2019-06-16 ENCOUNTER — Other Ambulatory Visit: Payer: Self-pay | Admitting: Family Medicine

## 2019-06-16 DIAGNOSIS — Z1231 Encounter for screening mammogram for malignant neoplasm of breast: Secondary | ICD-10-CM

## 2019-07-24 ENCOUNTER — Ambulatory Visit: Payer: Medicare Other

## 2019-07-28 ENCOUNTER — Ambulatory Visit
Admission: RE | Admit: 2019-07-28 | Discharge: 2019-07-28 | Disposition: A | Discharge status: Home / Self Care | Payer: Medicare PPO | Source: Ambulatory Visit | Attending: Family Medicine | Admitting: Family Medicine

## 2019-07-28 ENCOUNTER — Other Ambulatory Visit: Payer: Self-pay

## 2019-07-28 DIAGNOSIS — Z1231 Encounter for screening mammogram for malignant neoplasm of breast: Secondary | ICD-10-CM

## 2019-08-09 DIAGNOSIS — N3001 Acute cystitis with hematuria: Secondary | ICD-10-CM | POA: Diagnosis not present

## 2019-08-09 DIAGNOSIS — R350 Frequency of micturition: Secondary | ICD-10-CM | POA: Diagnosis not present

## 2019-08-14 ENCOUNTER — Emergency Department (HOSPITAL_COMMUNITY)
Admission: EM | Admit: 2019-08-14 | Discharge: 2019-08-14 | Disposition: A | Discharge status: Home / Self Care | Payer: Medicare PPO | Attending: Emergency Medicine | Admitting: Emergency Medicine

## 2019-08-14 ENCOUNTER — Encounter (HOSPITAL_COMMUNITY): Payer: Self-pay

## 2019-08-14 DIAGNOSIS — Z79899 Other long term (current) drug therapy: Secondary | ICD-10-CM | POA: Diagnosis not present

## 2019-08-14 DIAGNOSIS — K921 Melena: Secondary | ICD-10-CM | POA: Diagnosis not present

## 2019-08-14 DIAGNOSIS — I10 Essential (primary) hypertension: Secondary | ICD-10-CM | POA: Diagnosis not present

## 2019-08-14 DIAGNOSIS — Z7901 Long term (current) use of anticoagulants: Secondary | ICD-10-CM | POA: Diagnosis not present

## 2019-08-14 DIAGNOSIS — F17210 Nicotine dependence, cigarettes, uncomplicated: Secondary | ICD-10-CM | POA: Diagnosis not present

## 2019-08-14 DIAGNOSIS — R195 Other fecal abnormalities: Secondary | ICD-10-CM | POA: Diagnosis not present

## 2019-08-14 DIAGNOSIS — I2510 Atherosclerotic heart disease of native coronary artery without angina pectoris: Secondary | ICD-10-CM | POA: Insufficient documentation

## 2019-08-14 DIAGNOSIS — I739 Peripheral vascular disease, unspecified: Secondary | ICD-10-CM | POA: Insufficient documentation

## 2019-08-14 LAB — COMPREHENSIVE METABOLIC PANEL
ALT: 17 U/L (ref 0–44)
AST: 20 U/L (ref 15–41)
Albumin: 3.9 g/dL (ref 3.5–5.0)
Alkaline Phosphatase: 72 U/L (ref 38–126)
Anion gap: 10 (ref 5–15)
BUN: 9 mg/dL (ref 8–23)
CO2: 26 mmol/L (ref 22–32)
Calcium: 9.1 mg/dL (ref 8.9–10.3)
Chloride: 99 mmol/L (ref 98–111)
Creatinine, Ser: 0.84 mg/dL (ref 0.44–1.00)
GFR calc Af Amer: >60 mL/min (ref >60)
GFR calc non Af Amer: >60 mL/min (ref >60)
Glucose, Bld: 106 mg/dL — ABNORMAL HIGH (ref 70–99)
Potassium: 3.9 mmol/L (ref 3.5–5.1)
Sodium: 135 mmol/L (ref 135–145)
Total Bilirubin: 0.7 mg/dL (ref 0.3–1.2)
Total Protein: 6.8 g/dL (ref 6.5–8.1)

## 2019-08-14 LAB — POC OCCULT BLOOD, ED: Fecal Occult Bld: NEGATIVE

## 2019-08-14 LAB — CBC
HCT: 44.2 % (ref 36.0–46.0)
Hemoglobin: 14.4 g/dL (ref 12.0–15.0)
MCH: 32.5 pg (ref 26.0–34.0)
MCHC: 32.6 g/dL (ref 30.0–36.0)
MCV: 99.8 fL (ref 80.0–100.0)
Platelets: 214 10*3/uL (ref 150–400)
RBC: 4.43 MIL/uL (ref 3.87–5.11)
RDW: 13.2 % (ref 11.5–15.5)
WBC: 7.9 10*3/uL (ref 4.0–10.5)
nRBC: 0 % (ref 0.0–0.2)

## 2019-08-14 LAB — ABO/RH: ABO/RH(D): B POS

## 2019-08-14 LAB — TYPE AND SCREEN
ABO/RH(D): B POS
Antibody Screen: NEGATIVE

## 2019-08-14 NOTE — ED Provider Notes (Signed)
[facility] EMERGENCY DEPARTMENT Provider Note   CSN: [id number] Arrival date & time: 08/14/19  1221     History Chief Complaint  Patient presents with   GI Bleeding    Sara Dominguez is a 79 y.o. female history of coronary disease on Plavix, history of upper GI bleed (gastric AVM, ablated in 2019), presented emerge department dark stools.  The patient reports that she noted dark, black looking stools for the past 2 days.  She says that about 2 weeks ago she thought she had a stomach bug, with nausea and frequent episodes of cramping abdominal pain and diarrhea.  However stool did not turn dark until 2 days ago.  She is concerned because she had a similar presentation several years ago, at which point she was diagnosed and treated for upper GI AVMs.  She did require blood transfusion for that in the past.  She does report overall low energy but says been ongoing for "a while" and wonders if it's just "aging."  She denies lightheadedness, shortness of breath, dizziness.  She denies any bright red blood per rectum.  She denies any abdominal pain, epigastric pain, cramping.  She does continue to take Plavix.  Her GI doctor is Dr [name] with [facility].   She was planning to leave for a 3 month vacation to [facility] tomorrow with her family.  HPI     Past Medical History:  Diagnosis Date   AAA (abdominal aortic aneurysm), small, history of 2.3 X 2.4 07/06/2011   Abnormal ankle brachial index 07/06/2011   Anxiety    CAD (coronary artery disease), with LAD stent in 2006, chronically occluded OM branch with hx. of Stent to RCA  07/06/2011   Claudication in peripheral vascular disease, life style limiting 07/06/2011   Hyperlipidemia LDL goal < 70 09/08/2012   Hypertension    PAD (peripheral artery disease) (HCC)     Patient Active Problem List   Diagnosis Date Noted   Anemia, iron deficiency 07/03/2017   GI bleed 06/06/2016   Acute blood loss anemia  06/06/2016   Carotid artery disease (HCC) 12/29/2015   Claudication (HCC) 05/14/2013   Essential hypertension 04/28/2013   Hyperlipidemia with target LDL less than 70 09/08/2012   Claudication in peripheral vascular disease, life style limiting 07/06/2011   Abnormal ankle brachial index 07/06/2011   PAD Rt SFA stent 2006 with ISR 5/13. New RSFA stent 05/14/13 07/06/2011   CAD- LAD stent in 2006, chronically occluded OM branch with hx. of Stent to RCA  07/06/2011   AAA (abdominal aortic aneurysm), small, history of 2.3 X 2.4 07/06/2011    Past Surgical History:  Procedure Laterality Date   ABDOMINAL AORTAGRAM N/A 07/05/2011   Procedure: ABDOMINAL Ronny Flurry;  Surgeon: Runell Gess, MD;  Location: [facility] CATH LAB;  Service: Cardiovascular;  Laterality: N/A;   ABDOMINAL HYSTERECTOMY  1992   CATARACT EXTRACTION W/ INTRAOCULAR LENS  IMPLANT, BILATERAL Bilateral ~ 2006   CORONARY ANGIOPLASTY WITH STENT PLACEMENT  2006; 2009; 2013   "1 + 1 + 2";  total of 4"   ENTEROSCOPY N/A 08/29/2017   Procedure: ENTEROSCOPY;  Surgeon: Bernette Redbird, MD;  Location: [facility] ENDOSCOPY;  Service: Endoscopy;  Laterality: N/A;   ESOPHAGOGASTRODUODENOSCOPY (EGD) WITH PROPOFOL N/A 06/07/2016   Procedure: ESOPHAGOGASTRODUODENOSCOPY (EGD) WITH PROPOFOL;  Surgeon: Bernette Redbird, MD;  Location: [facility] ENDOSCOPY;  Service: Endoscopy;  Laterality: N/A;   FEMORAL ARTERY STENT Right 2006; 07/05/2011; 05/14/2013   FEMORAL ARTERY STENT Right 05/14/13  between previous stens   HOT HEMOSTASIS N/A 08/29/2017   Procedure: HOT HEMOSTASIS (ARGON PLASMA COAGULATION/BICAP);  Surgeon: Bernette Redbird, MD;  Location: [facility] ENDOSCOPY;  Service: Endoscopy;  Laterality: N/A;   LOWER EXTREMITY ANGIOGRAM N/A 07/05/2011   Procedure: LOWER EXTREMITY ANGIOGRAM;  Surgeon: Runell Gess, MD;  Location: [facility] CATH LAB;  Service: Cardiovascular;  Laterality: N/A;   LOWER EXTREMITY ANGIOGRAM N/A 05/14/2013   Procedure: LOWER EXTREMITY ANGIOGRAM;   Surgeon: Runell Gess, MD;  Location: [facility] CATH LAB;  Service: Cardiovascular;  Laterality: N/A;   LUMBAR DISC SURGERY  1980;; 1982   PERCUTANEOUS STENT INTERVENTION Right 07/05/2011   Procedure: PERCUTANEOUS STENT INTERVENTION;  Surgeon: Runell Gess, MD;  Location: [facility] CATH LAB;  Service: Cardiovascular;  Laterality: Right;   PERIPHERAL VASCULAR CATHETERIZATION N/A 11/04/2014   Procedure: Lower Extremity Angiography;  Surgeon: Runell Gess, MD;  Location: [facility] INVASIVE CV LAB;  Service: Cardiovascular;  Laterality: N/A;   PERIPHERAL VASCULAR CATHETERIZATION Right 11/04/2014   Procedure: Peripheral Vascular Intervention;  Surgeon: Runell Gess, MD;  Location: [facility] INVASIVE CV LAB;  Service: Cardiovascular;  Laterality: Right;  SFA   SHOULDER OPEN ROTATOR CUFF REPAIR Left ~ 2004     OB History   No obstetric history on file.     Family History  Problem Relation Age of Onset   Cancer Mother 35   Heart attack Father        died 9   Parkinson's disease Sister    Heart attack Maternal Grandmother    Heart attack Maternal Grandfather 50       died at 5   Stroke Paternal Grandfather    Heart disease Sister    Deep vein thrombosis Daughter     Social History   Tobacco Use   Smoking status: Current Every Day Smoker    Packs/day: 1.00    Years: 50.00    Pack years: 50.00    Types: Cigarettes, E-cigarettes    Last attempt to quit: 07/14/2010    Years since quitting: 9.0   Smokeless tobacco: Never Used   Tobacco comment: Resumed smoking 8 months ago, smokes approximately 7 cigarettes daily.. Counseling given  Substance Use Topics   Alcohol use: Yes    Alcohol/week: 3.0 standard drinks    Types: 3 Glasses of wine per week    Comment: 05/14/2013  "socially; 2-3 glasses of wine twice/wk"   Drug use: No    Home Medications Prior to Admission medications   Medication Sig Start Date End Date Taking? Authorizing Provider  acetaminophen (TYLENOL) 500 MG tablet  Take 500-1,000 mg by mouth every 6 (six) hours as needed for mild pain or headache.   Yes [provider]  alendronate (FOSAMAX) 70 MG tablet Take 70 mg by mouth every Wednesday. Take with a full glass of water on an empty stomach.   Yes [provider]  atorvastatin (LIPITOR) 40 MG tablet Take 1 tablet (40 mg total) by mouth daily. Please schedule appointment for refills. Patient taking differently: Take 40 mg by mouth daily.  10/28/15  Yes Runell Gess, MD  CALCIUM PO Take 1 tablet by mouth daily after breakfast.   Yes [provider]  Cholecalciferol (VITAMIN D-3) 25 MCG (1000 UT) CAPS Take 1,000 Units by mouth daily.   Yes [provider]  clopidogrel (PLAVIX) 75 MG tablet TAKE 1 TABLET BY MOUTH  DAILY Patient taking differently: Take 75 mg by mouth daily.  12/03/18  Yes Runell Gess, MD  ferrous sulfate 325 (65 FE) MG tablet Take 325 mg by mouth every Wednesday.    Yes [provider]  losartan (COZAAR) 100 MG tablet Take 1 tablet (100 mg total) by mouth daily. 05/06/15  Yes Runell Gess, MD  nitrofurantoin, macrocrystal-monohydrate, (MACROBID) 100 MG capsule Take 100 mg by mouth 2 (two) times daily. FOR 7 DAYS 08/09/19 08/16/19 Yes [provider]  Omega-3 Fatty Acids (FISH OIL) 1000 MG CAPS Take 1,000 mg by mouth daily.    Yes [provider]  pantoprazole (PROTONIX) 40 MG tablet Take 40 mg by mouth daily. 07/06/19  Yes [provider]    Allergies    Patient has no known allergies.  Review of Systems   Review of Systems  Constitutional: Negative for chills and fever.  HENT: Negative for ear pain and sore throat.   Eyes: Negative for pain and visual disturbance.  Respiratory: Negative for cough and shortness of breath.   Cardiovascular: Negative for chest pain and palpitations.  Gastrointestinal: Positive for blood in stool. Negative for abdominal pain, diarrhea, nausea and vomiting.  Genitourinary:  Negative for dysuria and hematuria.  Musculoskeletal: Negative for arthralgias and back pain.  Skin: Negative for color change and rash.  Neurological: Negative for syncope, light-headedness and headaches.  Psychiatric/Behavioral: Negative for agitation and confusion.  All other systems reviewed and are negative.   Physical Exam Updated Vital Signs BP (!) 159/73 (BP Location: Right Arm)    Pulse 78    Temp (!) 97.3 F (36.3 C) (Oral)    Resp 18    SpO2 98%   Physical Exam Vitals and nursing note reviewed.  Constitutional:      General: She is not in acute distress.    Appearance: She is well-developed.  HENT:     Head: Normocephalic and atraumatic.  Eyes:     Conjunctiva/sclera: Conjunctivae normal.  Cardiovascular:     Rate and Rhythm: Normal rate and regular rhythm.     Pulses: Normal pulses.     Heart sounds: No murmur heard.   Pulmonary:     Effort: Pulmonary effort is normal. No respiratory distress.     Breath sounds: Normal breath sounds.  Abdominal:     Palpations: Abdomen is soft.     Tenderness: There is no abdominal tenderness. There is no guarding.  Genitourinary:    Comments: Rectal exam with scant amount of dark stool, no melano odor, no bright blood Musculoskeletal:     Cervical back: Neck supple.  Skin:    General: Skin is warm and dry.  Neurological:     Mental Status: She is alert.  Psychiatric:        Mood and Affect: Mood normal.        Behavior: Behavior normal.     ED Results / Procedures / Treatments   Labs (all labs ordered are listed, but only abnormal results are displayed) Labs Reviewed  COMPREHENSIVE METABOLIC PANEL - Abnormal; Notable for the following components:      Result Value   Glucose, Bld 106 (*)    All other components within normal limits  CBC  POC OCCULT BLOOD, ED  TYPE AND SCREEN  ABO/RH    EKG None  Radiology No results found.  Procedures Procedures (including critical care time)  Medications Ordered in  ED Medications - No data to display  ED Course  I have reviewed the triage vital signs and the nursing notes.  Pertinent labs & imaging results that were available  during my care of the patient were reviewed by me and considered in my medical decision making (see chart for details).  Well appearing 79 year old female here with dark stools x 2 days.  Hx of gastric AVM's ablated in 2019.  Follows with Dr Matthias HughsBuccini of GI.  No abdominal ttp or epigastric ttp to suggest perforated ulcer or inflammatory process to explain symptoms.  This may be a GI bleed.  Hgb is excellent today, vitals are stable, and her hemoccult is negative, however.  I discussed with the Dr Marca AnconaKarki from GI below as noted, and discussed again with the patient.  Plan for discharge home with close GI follow up.  I think this is reasonable given her overall healthy clinical appearance.  Clinical Course as of Aug 14 140  Fri Aug 14, 2019  1740 I spoke to Dr Marca AnconaKarki of [facility] who recommended either hospitalization for endoscopy tomorrow or expectant management at home.  I spoke to the patient who prefers to go home.  She is asymptomatic with normal Hgb, normal BUN and Cr, and hemoccult negative - I think this is reasonable.  I advised her to hold her plavix for 2 days and call Dr Buccini's office Monday to discuss this visit.  If she is continuing to have dark stools they may want to see her in the office.  She told me she can delay her vacation plans for at least a week to monitor this at home, as I explained I'm wary for her to be in another city if her symptoms worsen.   [MT]    Clinical Course User Index [MT] Dyron Kawano, Kermit BaloMatthew J, MD    Final Clinical Impression(s) / ED Diagnoses Final diagnoses:  Dark stools    Rx / DC Orders ED Discharge Orders    None       Devonne Kitchen, Kermit BaloMatthew J, MD 08/15/19 [phone]

## 2019-08-14 NOTE — ED Notes (Signed)
Pt verbalized understanding of discharge instructions. Follow up care and medication changes reviewed, pt had no further questions. 

## 2019-08-14 NOTE — Discharge Instructions (Addendum)
Your bloodwork and hemoglobin levels were completely normal today, and reassuring.  Your stool testing did not show blood.  However it is still possible that you are experiencing GI bleeding.  We talked about the option of staying in the hospital overnight for an endoscopy, versus going home and watching your symptoms at home.  I felt it was safe for you to go home today, and you preferred to go home.  You should STOP taking your plavix for the next 2 days until you are able to talk to Dr Buccini's office on Monday.  Monitor your symptoms at home.  If you begin feeling short of breath, woozy, or lightheaded, come back to the ER.    Your GI doctor may want you to come into the office for further testing next week.  I strongly recommend that you do not leave for vacation this week.

## 2019-08-14 NOTE — ED Triage Notes (Signed)
Pt arrives to ED w/ c/o black tar-like stools that started yesterday. Pt reports abdominal pain and low energy.

## 2019-08-19 DIAGNOSIS — R195 Other fecal abnormalities: Secondary | ICD-10-CM | POA: Diagnosis not present

## 2019-08-26 DIAGNOSIS — R350 Frequency of micturition: Secondary | ICD-10-CM | POA: Diagnosis not present

## 2019-08-26 DIAGNOSIS — R319 Hematuria, unspecified: Secondary | ICD-10-CM | POA: Diagnosis not present

## 2019-08-26 DIAGNOSIS — N390 Urinary tract infection, site not specified: Secondary | ICD-10-CM | POA: Diagnosis not present

## 2019-09-08 DIAGNOSIS — N390 Urinary tract infection, site not specified: Secondary | ICD-10-CM | POA: Diagnosis not present

## 2019-09-08 DIAGNOSIS — I10 Essential (primary) hypertension: Secondary | ICD-10-CM | POA: Diagnosis not present

## 2019-09-08 DIAGNOSIS — R309 Painful micturition, unspecified: Secondary | ICD-10-CM | POA: Diagnosis not present

## 2019-09-28 DIAGNOSIS — R42 Dizziness and giddiness: Secondary | ICD-10-CM | POA: Diagnosis not present

## 2019-09-28 DIAGNOSIS — R109 Unspecified abdominal pain: Secondary | ICD-10-CM | POA: Diagnosis not present

## 2019-09-28 DIAGNOSIS — N390 Urinary tract infection, site not specified: Secondary | ICD-10-CM | POA: Diagnosis not present

## 2019-10-23 DIAGNOSIS — N390 Urinary tract infection, site not specified: Secondary | ICD-10-CM | POA: Diagnosis not present

## 2019-10-23 DIAGNOSIS — R05 Cough: Secondary | ICD-10-CM | POA: Diagnosis not present

## 2019-11-09 ENCOUNTER — Encounter: Payer: Self-pay | Admitting: Cardiovascular Disease

## 2019-11-09 NOTE — Telephone Encounter (Signed)
error 

## 2019-11-13 DIAGNOSIS — R300 Dysuria: Secondary | ICD-10-CM | POA: Diagnosis not present

## 2019-11-13 DIAGNOSIS — N3000 Acute cystitis without hematuria: Secondary | ICD-10-CM | POA: Diagnosis not present

## 2019-11-25 DIAGNOSIS — Z0000 Encounter for general adult medical examination without abnormal findings: Secondary | ICD-10-CM | POA: Diagnosis not present

## 2019-11-25 DIAGNOSIS — Z6823 Body mass index (BMI) 23.0-23.9, adult: Secondary | ICD-10-CM | POA: Diagnosis not present

## 2019-11-25 DIAGNOSIS — M859 Disorder of bone density and structure, unspecified: Secondary | ICD-10-CM | POA: Diagnosis not present

## 2019-11-25 DIAGNOSIS — I10 Essential (primary) hypertension: Secondary | ICD-10-CM | POA: Diagnosis not present

## 2019-11-25 DIAGNOSIS — N390 Urinary tract infection, site not specified: Secondary | ICD-10-CM | POA: Diagnosis not present

## 2019-11-25 DIAGNOSIS — Z8719 Personal history of other diseases of the digestive system: Secondary | ICD-10-CM | POA: Diagnosis not present

## 2019-11-25 DIAGNOSIS — E7800 Pure hypercholesterolemia, unspecified: Secondary | ICD-10-CM | POA: Diagnosis not present

## 2019-11-25 DIAGNOSIS — Z87891 Personal history of nicotine dependence: Secondary | ICD-10-CM | POA: Diagnosis not present

## 2019-11-25 DIAGNOSIS — Z79899 Other long term (current) drug therapy: Secondary | ICD-10-CM | POA: Diagnosis not present

## 2019-11-26 DIAGNOSIS — H1033 Unspecified acute conjunctivitis, bilateral: Secondary | ICD-10-CM | POA: Diagnosis not present

## 2019-12-15 ENCOUNTER — Ambulatory Visit (HOSPITAL_COMMUNITY)
Admission: RE | Admit: 2019-12-15 | Discharge: 2019-12-15 | Disposition: A | Discharge status: Home / Self Care | Payer: Medicare PPO | Source: Ambulatory Visit | Attending: Cardiovascular Disease | Admitting: Cardiovascular Disease

## 2019-12-15 ENCOUNTER — Other Ambulatory Visit: Payer: Self-pay

## 2019-12-15 ENCOUNTER — Encounter (HOSPITAL_COMMUNITY): Payer: Medicare PPO

## 2019-12-15 ENCOUNTER — Other Ambulatory Visit (HOSPITAL_COMMUNITY): Payer: Self-pay | Admitting: Cardiovascular Disease

## 2019-12-15 DIAGNOSIS — I739 Peripheral vascular disease, unspecified: Secondary | ICD-10-CM | POA: Diagnosis not present

## 2019-12-15 DIAGNOSIS — I779 Disorder of arteries and arterioles, unspecified: Secondary | ICD-10-CM | POA: Insufficient documentation

## 2019-12-15 DIAGNOSIS — I6523 Occlusion and stenosis of bilateral carotid arteries: Secondary | ICD-10-CM | POA: Insufficient documentation

## 2019-12-17 DIAGNOSIS — M1711 Unilateral primary osteoarthritis, right knee: Secondary | ICD-10-CM | POA: Diagnosis not present

## 2019-12-25 ENCOUNTER — Ambulatory Visit: Payer: Medicare PPO | Admitting: Cardiovascular Disease

## 2020-01-13 ENCOUNTER — Other Ambulatory Visit: Payer: Self-pay

## 2020-01-13 ENCOUNTER — Encounter: Payer: Self-pay | Admitting: Cardiovascular Disease

## 2020-01-13 ENCOUNTER — Ambulatory Visit: Payer: Medicare PPO | Admitting: Cardiovascular Disease

## 2020-01-13 DIAGNOSIS — I10 Essential (primary) hypertension: Secondary | ICD-10-CM

## 2020-01-13 DIAGNOSIS — I739 Peripheral vascular disease, unspecified: Secondary | ICD-10-CM | POA: Diagnosis not present

## 2020-01-13 DIAGNOSIS — E785 Hyperlipidemia, unspecified: Secondary | ICD-10-CM | POA: Diagnosis not present

## 2020-01-13 DIAGNOSIS — I2510 Atherosclerotic heart disease of native coronary artery without angina pectoris: Secondary | ICD-10-CM | POA: Diagnosis not present

## 2020-01-13 NOTE — Assessment & Plan Note (Signed)
History of hyperlipidemia on statin therapy with recent lipid profile performed by her PCP 11/25/2019 revealing a total cholesterol of 142, LDL 72 and HDL 49.

## 2020-01-13 NOTE — Assessment & Plan Note (Signed)
History of CAD status post LAD stenting by myself back in 2006.  I last catheterized her 08/05/2009 revealing a patent LAD stent the chronically occluded OM branch and high-grade mid RCA stenosis which I stented with a Taxus Ion drug-eluting stent.  She was stented in [facility] in 2015 by Dr. Ellis Parents where he placed 2 additional stents in her right coronary artery.  Since that time she denies chest pain or shortness of breath.

## 2020-01-13 NOTE — Patient Instructions (Signed)
Medication Instructions:  Your physician recommends that you continue on your current medications as directed. Please refer to the Current Medication list given to you today.  *If you need a refill on your cardiac medications before your next appointment, please call your pharmacy*  Testing/Procedures: Your physician has requested that you have a lower extremity arterial duplex. This test is an ultrasound of the arteries in the legs. It looks at arterial blood flow in the legs and arms. Allow one hour for Lower Arterial scans. There are no restrictions or special instructions. [address].   Follow-Up: At [facility], you and your health needs are our priority.  As part of our continuing mission to provide you with exceptional heart care, we have created designated Provider Care Teams.  These Care Teams include your primary Cardiologist (physician) and Advanced Practice Providers (APPs -  Physician Assistants and Nurse Practitioners) who all work together to provide you with the care you need, when you need it.  We recommend signing up for the patient portal called "MyChart".  Sign up information is provided on this After Visit Summary.  MyChart is used to connect with patients for Virtual Visits (Telemedicine).  Patients are able to view lab/test results, encounter notes, upcoming appointments, etc.  Non-urgent messages can be sent to your provider as well.   To learn more about what you can do with MyChart, go to [email or web address].    Your next appointment:   12 month(s)  The format for your next appointment:   In Person  Provider:   Nanetta Batty, MD

## 2020-01-13 NOTE — Assessment & Plan Note (Signed)
History of peripheral arterial disease status post multiple procedures on her right lower extremity dating back to 2006.  She had a Viabahn stent placed by myself between 2 previously placed right SFA stents 05/14/2013 with three-vessel runoff bilaterally and an additional stent placed for a in-stent restenosis at the proximal edge of a previously placed stent in the right SFA 11/04/2014.  Since that time she has had no claudication.  Recent Dopplers performed 12/15/2019 revealed a right ABI of 0.87 and left of 0.85.  Her stents appear widely patent.  We will follow-up lower extremity Doppler studies on her in 12 months.

## 2020-01-13 NOTE — Assessment & Plan Note (Signed)
History of essential hypertension a blood pressure measured today of 150/73.  She is on losartan.

## 2020-01-13 NOTE — Progress Notes (Signed)
01/13/2020 Sara Dominguez [facility]   04-15-1940  [id number]  Primary Physician [name], MD Primary Cardiologist: Runell Gess MD FACP, [place], [place], [place]  HPI:  Sara Dominguez is a 79 y.o.  thin-appearing, married Caucasian female, mother of 2, grandmother to 3 grandchildren who I last saw in theoffice  12/23/2018.Marland Kitchen She has a history of CAD status post LAD stenting by myself in 2006, as well as right SFA stenting several months thereafter. She just stopped smoking in July of last year. I catheterized her August 05, 2009, revealing a patent LAD stent and a chronically occluded OM branch with high-grade mid RCA lesion which I stented with a Taxus Ion drug-eluting stent. At that time, I demonstrated a small abdominal aortic aneurysm measuring 2.3 x 2.4 cm. In addition, she developed recurrent lower extremity pain with Dopplers that showed a decrease in the right ABI from 0.99 to 0.75 with a high frequency signal in her mid right SFA. I angiogram'd her Jul 05, 2011, revealing proximal right SFA lesion along with in-stent restenosis. I revascularized her percutaneously which resulted in marked improvement in her ability to ambulate and in her Dopplers. She did develop arm pain while at [facility] in July of last year and was cath'd at [facility] by Dr. Ellis Parents radially who placed 2 drug-eluting stents in the right coronary artery.   Because of recurrent claudication and abnormal Doppler studies I re-angiogramed to her 05/14/13 revealing high-grade disease in the mid right SFA between 2 previously placed stents. I restarted her with a 6 mm x 100 mm long Viabahn covered stent with excellent angiographic and clinical result. Her follow-up lower extremity arterial Doppler studies performed 12/04/13 revealed ABIs of 1 bilaterally with a widely patent stent. Since I saw her last she is complaining of some right lateral thigh pain and tingling in her right foot as well as dysesthesia in both  upper extremities. She does have a palpable pedal pulse on the right side. I performed a lower extremity ultrasound on her 09/27/14 revealing a high-frequency signal in the mid right SFA with a decline in her right ABI from 1.0 -.79. I angiogramed her 11/04/14 revealing a 95% stenosis of the leading edge of the previously placed covered stent which I restented using a 6 mm x 25 mm longViabahncovered stentrecent lower extremity arterial Doppler studies performed 07/25/16 revealed widely patent stents in her right SFA with ABIs of approximately 1 bilaterally. She denies claudication. In addition, she denies chest pain or shortness of breath. She did have endoscopy performed back in April because of anemia and a positive occult fecal blood test and this demonstrated a gastric ulcer. Her antiplatelet locations where held for a short period of time.  She was admitted to the hospital with of breath and lower extremity weakness. 2D echo was essentially normal. Her hemoglobin was in the low 7 range and she was transfused 2 units of packed red blood cells which improved her shortness of breath and claudication. Dopplers did however show a decline in her right ABI down to the .79range similar to prior Dopplers when she had restenosed on that side although she currently denies claudication.  Since I saw her a year ago she is remained stable.  She denies chest pain or shortness of breath or claudication.  She did have Doppler studies performed 12/15/2019 revealing a right ABI 0.87 and a left of 0.85 with a widely patent stent in her right SFA..    Current  Meds  Medication Sig  . acetaminophen (TYLENOL) 500 MG tablet Take 500-1,000 mg by mouth every 6 (six) hours as needed for mild pain or headache.  . alendronate (FOSAMAX) 70 MG tablet Take 70 mg by mouth every Wednesday. Take with a full glass of water on an empty stomach.  Marland Kitchen atorvastatin (LIPITOR) 40 MG tablet Take 1 tablet (40 mg total) by mouth daily.  Please schedule appointment for refills. (Patient taking differently: Take 40 mg by mouth daily. )  . CALCIUM PO Take 1 tablet by mouth daily after breakfast.  . Cholecalciferol (VITAMIN D-3) 25 MCG (1000 UT) CAPS Take 1,000 Units by mouth daily.  . clopidogrel (PLAVIX) 75 MG tablet TAKE 1 TABLET BY MOUTH  DAILY (Patient taking differently: Take 75 mg by mouth daily. )  . ferrous sulfate 325 (65 FE) MG tablet Take 325 mg by mouth every Wednesday.   . losartan (COZAAR) 100 MG tablet Take 1 tablet (100 mg total) by mouth daily.  . Omega-3 Fatty Acids (FISH OIL) 1000 MG CAPS Take 1,000 mg by mouth daily.   . pantoprazole (PROTONIX) 40 MG tablet Take 40 mg by mouth daily.  . vitamin B-12 (CYANOCOBALAMIN) 250 MCG tablet Take 250 mcg by mouth daily.     No Known Allergies  Social History   Socioeconomic History  . Marital status: Married    Spouse name: Not on file  . Number of children: Not on file  . Years of education: Not on file  . Highest education level: Not on file  Occupational History  . Not on file  Tobacco Use  . Smoking status: Current Every Day Smoker    Packs/day: 1.00    Years: 50.00    Pack years: 50.00    Types: Cigarettes, E-cigarettes    Last attempt to quit: 07/14/2010    Years since quitting: 9.5  . Smokeless tobacco: Never Used  . Tobacco comment: Resumed smoking 8 months ago, smokes approximately 7 cigarettes daily.. Counseling given  Substance and Sexual Activity  . Alcohol use: Yes    Alcohol/week: 3.0 standard drinks    Types: 3 Glasses of wine per week    Comment: 05/14/2013  "socially; 2-3 glasses of wine twice/wk"  . Drug use: No  . Sexual activity: Not Currently  Other Topics Concern  . Not on file  Social History Narrative  . Not on file   Social Determinants of Health   Financial Resource Strain:   . Difficulty of Paying Living Expenses: Not on file  Food Insecurity:   . Worried About Programme researcher, broadcasting/film/video in the Last Year: Not on file  . Ran  Out of Food in the Last Year: Not on file  Transportation Needs:   . Lack of Transportation (Medical): Not on file  . Lack of Transportation (Non-Medical): Not on file  Physical Activity:   . Days of Exercise per Week: Not on file  . Minutes of Exercise per Session: Not on file  Stress:   . Feeling of Stress : Not on file  Social Connections:   . Frequency of Communication with Friends and Family: Not on file  . Frequency of Social Gatherings with Friends and Family: Not on file  . Attends Religious Services: Not on file  . Active Member of Clubs or Organizations: Not on file  . Attends Banker Meetings: Not on file  . Marital Status: Not on file  Intimate Partner Violence:   . Fear of Current or Ex-Partner:  Not on file  . Emotionally Abused: Not on file  . Physically Abused: Not on file  . Sexually Abused: Not on file     Review of Systems: General: negative for chills, fever, night sweats or weight changes.  Cardiovascular: negative for chest pain, dyspnea on exertion, edema, orthopnea, palpitations, paroxysmal nocturnal dyspnea or shortness of breath Dermatological: negative for rash Respiratory: negative for cough or wheezing Urologic: negative for hematuria Abdominal: negative for nausea, vomiting, diarrhea, bright red blood per rectum, melena, or hematemesis Neurologic: negative for visual changes, syncope, or dizziness All other systems reviewed and are otherwise negative except as noted above.    Blood pressure (!) 150/73, pulse 72, temperature 97.7 F (36.5 C), height 5\' 2"  (1.575 m), weight 153 lb 3.2 oz (69.5 kg), SpO2 97 %.  General appearance: alert and no distress Neck: no adenopathy, no carotid bruit, no JVD, supple, symmetrical, trachea midline and thyroid not enlarged, symmetric, no tenderness/mass/nodules Lungs: clear to auscultation bilaterally Heart: regular rate and rhythm, S1, S2 normal, no murmur, click, rub or gallop Extremities:  extremities normal, atraumatic, no cyanosis or edema Pulses: 2+ and symmetric Skin: Skin color, texture, turgor normal. No rashes or lesions Neurologic: Alert and oriented X 3, normal strength and tone. Normal symmetric reflexes. Normal coordination and gait  EKG sinus rhythm at 72 with frequent PACs.  I personally reviewed this EKG.  ASSESSMENT AND PLAN:   Claudication in peripheral vascular disease, life style limiting History of peripheral arterial disease status post multiple procedures on her right lower extremity dating back to 2006.  She had a Viabahn stent placed by myself between 2 previously placed right SFA stents 05/14/2013 with three-vessel runoff bilaterally and an additional stent placed for a in-stent restenosis at the proximal edge of a previously placed stent in the right SFA 11/04/2014.  Since that time she has had no claudication.  Recent Dopplers performed 12/15/2019 revealed a right ABI of 0.87 and left of 0.85.  Her stents appear widely patent.  We will follow-up lower extremity Doppler studies on her in 12 months.  CAD- LAD stent in 2006, chronically occluded OM branch with hx. of Stent to RCA  History of CAD status post LAD stenting by myself back in 2006.  I last catheterized her 08/05/2009 revealing a patent LAD stent the chronically occluded OM branch and high-grade mid RCA stenosis which I stented with a Taxus Ion drug-eluting stent.  She was stented in [facility] in 2015 by Dr. 2016 where he placed 2 additional stents in her right coronary artery.  Since that time she denies chest pain or shortness of breath.  Hyperlipidemia with target LDL less than 70 History of hyperlipidemia on statin therapy with recent lipid profile performed by her PCP 11/25/2019 revealing a total cholesterol of 142, LDL 72 and HDL 49.  Essential hypertension History of essential hypertension a blood pressure measured today of 150/73.  She is on  losartan.      11/27/2019 MD FACP,FACC,FAHA, [facility] 01/13/2020 10:53 AM

## 2020-01-24 DIAGNOSIS — R059 Cough, unspecified: Secondary | ICD-10-CM | POA: Diagnosis not present

## 2020-01-24 DIAGNOSIS — R350 Frequency of micturition: Secondary | ICD-10-CM | POA: Diagnosis not present

## 2020-01-24 DIAGNOSIS — Z03818 Encounter for observation for suspected exposure to other biological agents ruled out: Secondary | ICD-10-CM | POA: Diagnosis not present

## 2020-01-24 DIAGNOSIS — J22 Unspecified acute lower respiratory infection: Secondary | ICD-10-CM | POA: Diagnosis not present

## 2020-02-19 DIAGNOSIS — R350 Frequency of micturition: Secondary | ICD-10-CM | POA: Diagnosis not present

## 2020-03-17 DIAGNOSIS — R3915 Urgency of urination: Secondary | ICD-10-CM | POA: Diagnosis not present

## 2020-03-17 DIAGNOSIS — N3946 Mixed incontinence: Secondary | ICD-10-CM | POA: Diagnosis not present

## 2020-03-24 DIAGNOSIS — R3915 Urgency of urination: Secondary | ICD-10-CM | POA: Diagnosis not present

## 2020-03-24 DIAGNOSIS — M6281 Muscle weakness (generalized): Secondary | ICD-10-CM | POA: Diagnosis not present

## 2020-03-24 DIAGNOSIS — M6289 Other specified disorders of muscle: Secondary | ICD-10-CM | POA: Diagnosis not present

## 2020-03-24 DIAGNOSIS — N3946 Mixed incontinence: Secondary | ICD-10-CM | POA: Diagnosis not present

## 2020-03-24 DIAGNOSIS — R151 Fecal smearing: Secondary | ICD-10-CM | POA: Diagnosis not present

## 2020-03-24 DIAGNOSIS — R350 Frequency of micturition: Secondary | ICD-10-CM | POA: Diagnosis not present

## 2020-03-25 ENCOUNTER — Other Ambulatory Visit: Payer: Self-pay | Admitting: Cardiovascular Disease

## 2020-04-07 DIAGNOSIS — M62838 Other muscle spasm: Secondary | ICD-10-CM | POA: Diagnosis not present

## 2020-04-07 DIAGNOSIS — M6281 Muscle weakness (generalized): Secondary | ICD-10-CM | POA: Diagnosis not present

## 2020-04-07 DIAGNOSIS — M6289 Other specified disorders of muscle: Secondary | ICD-10-CM | POA: Diagnosis not present

## 2020-04-07 DIAGNOSIS — N3946 Mixed incontinence: Secondary | ICD-10-CM | POA: Diagnosis not present

## 2020-04-07 DIAGNOSIS — R151 Fecal smearing: Secondary | ICD-10-CM | POA: Diagnosis not present

## 2020-04-07 DIAGNOSIS — R3915 Urgency of urination: Secondary | ICD-10-CM | POA: Diagnosis not present

## 2020-04-12 DIAGNOSIS — R350 Frequency of micturition: Secondary | ICD-10-CM | POA: Diagnosis not present

## 2020-04-12 DIAGNOSIS — N3946 Mixed incontinence: Secondary | ICD-10-CM | POA: Diagnosis not present

## 2020-04-12 DIAGNOSIS — R151 Fecal smearing: Secondary | ICD-10-CM | POA: Diagnosis not present

## 2020-04-12 DIAGNOSIS — R3915 Urgency of urination: Secondary | ICD-10-CM | POA: Diagnosis not present

## 2020-04-21 DIAGNOSIS — Z85828 Personal history of other malignant neoplasm of skin: Secondary | ICD-10-CM | POA: Diagnosis not present

## 2020-04-21 DIAGNOSIS — L570 Actinic keratosis: Secondary | ICD-10-CM | POA: Diagnosis not present

## 2020-04-21 DIAGNOSIS — L821 Other seborrheic keratosis: Secondary | ICD-10-CM | POA: Diagnosis not present

## 2020-04-21 DIAGNOSIS — D225 Melanocytic nevi of trunk: Secondary | ICD-10-CM | POA: Diagnosis not present

## 2020-04-21 DIAGNOSIS — D2262 Melanocytic nevi of left upper limb, including shoulder: Secondary | ICD-10-CM | POA: Diagnosis not present

## 2020-04-21 DIAGNOSIS — D485 Neoplasm of uncertain behavior of skin: Secondary | ICD-10-CM | POA: Diagnosis not present

## 2020-04-21 DIAGNOSIS — D2261 Melanocytic nevi of right upper limb, including shoulder: Secondary | ICD-10-CM | POA: Diagnosis not present

## 2020-04-21 DIAGNOSIS — L430 Hypertrophic lichen planus: Secondary | ICD-10-CM | POA: Diagnosis not present

## 2020-05-04 DIAGNOSIS — R3915 Urgency of urination: Secondary | ICD-10-CM | POA: Diagnosis not present

## 2020-05-04 DIAGNOSIS — R8271 Bacteriuria: Secondary | ICD-10-CM | POA: Diagnosis not present

## 2020-05-04 DIAGNOSIS — N3946 Mixed incontinence: Secondary | ICD-10-CM | POA: Diagnosis not present

## 2020-05-04 DIAGNOSIS — M6289 Other specified disorders of muscle: Secondary | ICD-10-CM | POA: Diagnosis not present

## 2020-05-04 DIAGNOSIS — M6281 Muscle weakness (generalized): Secondary | ICD-10-CM | POA: Diagnosis not present

## 2020-05-04 DIAGNOSIS — R350 Frequency of micturition: Secondary | ICD-10-CM | POA: Diagnosis not present

## 2020-05-04 DIAGNOSIS — R151 Fecal smearing: Secondary | ICD-10-CM | POA: Diagnosis not present

## 2020-05-31 DIAGNOSIS — F17210 Nicotine dependence, cigarettes, uncomplicated: Secondary | ICD-10-CM | POA: Diagnosis not present

## 2020-05-31 DIAGNOSIS — K219 Gastro-esophageal reflux disease without esophagitis: Secondary | ICD-10-CM | POA: Diagnosis not present

## 2020-05-31 DIAGNOSIS — H9312 Tinnitus, left ear: Secondary | ICD-10-CM | POA: Diagnosis not present

## 2020-05-31 DIAGNOSIS — J3489 Other specified disorders of nose and nasal sinuses: Secondary | ICD-10-CM | POA: Diagnosis not present

## 2020-05-31 DIAGNOSIS — J383 Other diseases of vocal cords: Secondary | ICD-10-CM | POA: Diagnosis not present

## 2020-06-09 DIAGNOSIS — M62838 Other muscle spasm: Secondary | ICD-10-CM | POA: Diagnosis not present

## 2020-06-09 DIAGNOSIS — M6281 Muscle weakness (generalized): Secondary | ICD-10-CM | POA: Diagnosis not present

## 2020-06-09 DIAGNOSIS — R350 Frequency of micturition: Secondary | ICD-10-CM | POA: Diagnosis not present

## 2020-06-09 DIAGNOSIS — R3915 Urgency of urination: Secondary | ICD-10-CM | POA: Diagnosis not present

## 2020-06-09 DIAGNOSIS — M6289 Other specified disorders of muscle: Secondary | ICD-10-CM | POA: Diagnosis not present

## 2020-06-09 DIAGNOSIS — N3946 Mixed incontinence: Secondary | ICD-10-CM | POA: Diagnosis not present

## 2020-06-16 DIAGNOSIS — H52223 Regular astigmatism, bilateral: Secondary | ICD-10-CM | POA: Diagnosis not present

## 2020-06-16 DIAGNOSIS — H35371 Puckering of macula, right eye: Secondary | ICD-10-CM | POA: Diagnosis not present

## 2020-06-16 DIAGNOSIS — H5203 Hypermetropia, bilateral: Secondary | ICD-10-CM | POA: Diagnosis not present

## 2020-06-16 DIAGNOSIS — H35372 Puckering of macula, left eye: Secondary | ICD-10-CM | POA: Diagnosis not present

## 2020-06-16 DIAGNOSIS — H3563 Retinal hemorrhage, bilateral: Secondary | ICD-10-CM | POA: Diagnosis not present

## 2020-06-16 DIAGNOSIS — Z961 Presence of intraocular lens: Secondary | ICD-10-CM | POA: Diagnosis not present

## 2020-06-16 DIAGNOSIS — H524 Presbyopia: Secondary | ICD-10-CM | POA: Diagnosis not present

## 2020-06-17 ENCOUNTER — Other Ambulatory Visit: Payer: Self-pay | Admitting: Family Medicine

## 2020-06-17 DIAGNOSIS — Z1231 Encounter for screening mammogram for malignant neoplasm of breast: Secondary | ICD-10-CM

## 2020-06-21 DIAGNOSIS — H903 Sensorineural hearing loss, bilateral: Secondary | ICD-10-CM | POA: Diagnosis not present

## 2020-06-23 DIAGNOSIS — K219 Gastro-esophageal reflux disease without esophagitis: Secondary | ICD-10-CM | POA: Diagnosis not present

## 2020-06-23 DIAGNOSIS — R198 Other specified symptoms and signs involving the digestive system and abdomen: Secondary | ICD-10-CM | POA: Diagnosis not present

## 2020-06-23 DIAGNOSIS — R1314 Dysphagia, pharyngoesophageal phase: Secondary | ICD-10-CM | POA: Diagnosis not present

## 2020-06-23 DIAGNOSIS — K31819 Angiodysplasia of stomach and duodenum without bleeding: Secondary | ICD-10-CM | POA: Diagnosis not present

## 2020-07-05 ENCOUNTER — Encounter: Payer: Self-pay | Admitting: Acute Care

## 2020-07-07 ENCOUNTER — Other Ambulatory Visit: Payer: Self-pay | Admitting: *Deleted

## 2020-07-07 DIAGNOSIS — F17210 Nicotine dependence, cigarettes, uncomplicated: Secondary | ICD-10-CM

## 2020-07-07 DIAGNOSIS — Z87891 Personal history of nicotine dependence: Secondary | ICD-10-CM

## 2020-07-12 DIAGNOSIS — D500 Iron deficiency anemia secondary to blood loss (chronic): Secondary | ICD-10-CM | POA: Diagnosis not present

## 2020-07-12 DIAGNOSIS — R197 Diarrhea, unspecified: Secondary | ICD-10-CM | POA: Diagnosis not present

## 2020-08-03 ENCOUNTER — Ambulatory Visit
Admission: RE | Admit: 2020-08-03 | Discharge: 2020-08-03 | Disposition: A | Discharge status: Home / Self Care | Payer: Medicare PPO | Source: Ambulatory Visit | Attending: Acute Care | Admitting: Acute Care

## 2020-08-03 DIAGNOSIS — J432 Centrilobular emphysema: Secondary | ICD-10-CM | POA: Diagnosis not present

## 2020-08-03 DIAGNOSIS — F17210 Nicotine dependence, cigarettes, uncomplicated: Secondary | ICD-10-CM

## 2020-08-03 DIAGNOSIS — Z87891 Personal history of nicotine dependence: Secondary | ICD-10-CM

## 2020-08-09 NOTE — Progress Notes (Signed)
Please call patient and let them  know their  low dose Ct was read as a Lung RADS 2: nodules that are benign in appearance and behavior with a very low likelihood of becoming a clinically active cancer due to size or lack of growth. Recommendation per radiology is for a repeat LDCT in 12 months. .Please let them  know we will order and schedule their  annual screening scan for 07/2021. Please let them  know there was notation of CAD on their  scan.  Please remind the patient  that this is a non-gated exam therefore degree or severity of disease  cannot be determined. Please have them  follow up with their PCP regarding potential risk factor modification, dietary therapy or pharmacologic therapy if clinically indicated. Pt.  is  currently on statin therapy. Please place order for annual  screening scan for  07/2021 and fax results to PCP. Thanks so much. 

## 2020-08-10 ENCOUNTER — Encounter: Payer: Self-pay | Admitting: *Deleted

## 2020-08-10 DIAGNOSIS — Z87891 Personal history of nicotine dependence: Secondary | ICD-10-CM

## 2020-08-10 DIAGNOSIS — F17210 Nicotine dependence, cigarettes, uncomplicated: Secondary | ICD-10-CM

## 2020-08-11 ENCOUNTER — Other Ambulatory Visit: Payer: Self-pay

## 2020-08-11 ENCOUNTER — Ambulatory Visit
Admission: RE | Admit: 2020-08-11 | Discharge: 2020-08-11 | Disposition: A | Discharge status: Home / Self Care | Payer: Medicare PPO | Source: Ambulatory Visit | Attending: Family Medicine | Admitting: Family Medicine

## 2020-08-11 DIAGNOSIS — Z1231 Encounter for screening mammogram for malignant neoplasm of breast: Secondary | ICD-10-CM

## 2020-11-24 DIAGNOSIS — Z20822 Contact with and (suspected) exposure to covid-19: Secondary | ICD-10-CM | POA: Diagnosis not present

## 2020-11-24 DIAGNOSIS — K219 Gastro-esophageal reflux disease without esophagitis: Secondary | ICD-10-CM | POA: Diagnosis not present

## 2020-11-24 DIAGNOSIS — I10 Essential (primary) hypertension: Secondary | ICD-10-CM | POA: Diagnosis not present

## 2020-11-24 DIAGNOSIS — M1990 Unspecified osteoarthritis, unspecified site: Secondary | ICD-10-CM | POA: Diagnosis not present

## 2020-11-24 DIAGNOSIS — R197 Diarrhea, unspecified: Secondary | ICD-10-CM | POA: Diagnosis not present

## 2020-11-24 DIAGNOSIS — N898 Other specified noninflammatory disorders of vagina: Secondary | ICD-10-CM | POA: Diagnosis not present

## 2020-11-24 DIAGNOSIS — R051 Acute cough: Secondary | ICD-10-CM | POA: Diagnosis not present

## 2020-11-24 DIAGNOSIS — U071 COVID-19: Secondary | ICD-10-CM | POA: Diagnosis not present

## 2020-11-24 DIAGNOSIS — J069 Acute upper respiratory infection, unspecified: Secondary | ICD-10-CM | POA: Diagnosis not present

## 2020-11-24 DIAGNOSIS — E785 Hyperlipidemia, unspecified: Secondary | ICD-10-CM | POA: Diagnosis not present

## 2020-11-24 DIAGNOSIS — I739 Peripheral vascular disease, unspecified: Secondary | ICD-10-CM | POA: Diagnosis not present

## 2020-11-24 DIAGNOSIS — I2510 Atherosclerotic heart disease of native coronary artery without angina pectoris: Secondary | ICD-10-CM | POA: Diagnosis not present

## 2020-11-24 DIAGNOSIS — M8580 Other specified disorders of bone density and structure, unspecified site: Secondary | ICD-10-CM | POA: Diagnosis not present

## 2020-11-24 DIAGNOSIS — I252 Old myocardial infarction: Secondary | ICD-10-CM | POA: Diagnosis not present

## 2020-12-08 DIAGNOSIS — Z7189 Other specified counseling: Secondary | ICD-10-CM | POA: Diagnosis not present

## 2020-12-14 ENCOUNTER — Other Ambulatory Visit (HOSPITAL_COMMUNITY): Payer: Self-pay | Admitting: Cardiovascular Disease

## 2020-12-14 ENCOUNTER — Ambulatory Visit (HOSPITAL_COMMUNITY)
Admission: RE | Admit: 2020-12-14 | Discharge: 2020-12-14 | Disposition: A | Discharge status: Home / Self Care | Payer: Medicare PPO | Source: Ambulatory Visit | Attending: Cardiology | Admitting: Cardiology

## 2020-12-14 ENCOUNTER — Other Ambulatory Visit: Payer: Self-pay

## 2020-12-14 DIAGNOSIS — I739 Peripheral vascular disease, unspecified: Secondary | ICD-10-CM

## 2021-01-13 ENCOUNTER — Ambulatory Visit: Payer: Medicare PPO | Admitting: Cardiovascular Disease

## 2021-01-13 ENCOUNTER — Encounter: Payer: Self-pay | Admitting: Cardiovascular Disease

## 2021-01-13 ENCOUNTER — Other Ambulatory Visit: Payer: Self-pay

## 2021-01-13 VITALS — BP 162/90 | HR 60 | Ht 67.0 in | Wt 153.4 lb

## 2021-01-13 DIAGNOSIS — I2510 Atherosclerotic heart disease of native coronary artery without angina pectoris: Secondary | ICD-10-CM | POA: Diagnosis not present

## 2021-01-13 DIAGNOSIS — E785 Hyperlipidemia, unspecified: Secondary | ICD-10-CM

## 2021-01-13 DIAGNOSIS — I10 Essential (primary) hypertension: Secondary | ICD-10-CM | POA: Diagnosis not present

## 2021-01-13 DIAGNOSIS — I739 Peripheral vascular disease, unspecified: Secondary | ICD-10-CM | POA: Diagnosis not present

## 2021-01-13 LAB — HEPATIC FUNCTION PANEL
ALT: 17 IU/L (ref 0–32)
AST: 20 IU/L (ref 0–40)
Albumin: 4.2 g/dL (ref 3.7–4.7)
Alkaline Phosphatase: 79 IU/L (ref 44–121)
Bilirubin Total: 0.5 mg/dL (ref 0.0–1.2)
Bilirubin, Direct: 0.16 mg/dL (ref 0.00–0.40)
Total Protein: 6.6 g/dL (ref 6.0–8.5)

## 2021-01-13 LAB — LIPID PANEL
Chol/HDL Ratio: 2.7 ratio (ref 0.0–4.4)
Cholesterol, Total: 170 mg/dL (ref 100–199)
HDL: 64 mg/dL (ref >39)
LDL Chol Calc (NIH): 89 mg/dL (ref 0–99)
Triglycerides: 96 mg/dL (ref 0–149)
VLDL Cholesterol Cal: 17 mg/dL (ref 5–40)

## 2021-01-13 NOTE — Assessment & Plan Note (Signed)
History of peripheral arterial disease status post right SFA intervention by myself in 2006.  She has had multiple interventions since that time most recently 11/04/2014.  Her most recent lower extremity arterial Doppler studies performed 12/14/2020 revealed ABIs of 0.90 on the right and 0.98 on the left with patent stents in the right SFA.  The patient denies claudication.  We will continue to monitor on her noninvasively on an annual basis.

## 2021-01-13 NOTE — Assessment & Plan Note (Signed)
History of essential hypertension with blood pressure measured today at 170/84.  She did not take her antihypertensive medications this morning as of yet.  She is on losartan.

## 2021-01-13 NOTE — Assessment & Plan Note (Signed)
History of CAD status post LAD stenting by myself back in 2006.  I catheterized her 08/05/2009 revealing a patent LAD stent, a chronically occluded OM branch with high-grade mid RCA stenosis which I stented using a Taxus Ion drug-eluting stent.  She had 2 drug eluding stents placed at [facility] by Dr. Ellis Parents in 2015 well.  She currently denies chest pain or shortness of breath.

## 2021-01-13 NOTE — Progress Notes (Signed)
01/13/2021 Jennelle Jeanty [facility]   May 24, 1940  [id number]  Primary Physician Kathyrn Lass, MD Primary Cardiologist: Lorretta Harp MD FACP, [place], [place], [place]  HPI:  Sara Dominguez is a 80 y.o.   thin-appearing, married Caucasian female, mother of 2, grandmother to 3 grandchildren who I last saw in the office 01/13/2020.Marland Kitchen She has a history of CAD status post LAD stenting by myself in 2006, as well as right SFA stenting several months thereafter. She just stopped smoking in July of last year. I catheterized her August 05, 2009, revealing a patent LAD stent and a chronically occluded OM branch with high-grade mid RCA lesion which I stented with a Taxus Ion drug-eluting stent. At that time, I demonstrated a small abdominal aortic aneurysm measuring 2.3 x 2.4 cm. In addition, she developed recurrent lower extremity pain with Dopplers that showed a decrease in the right ABI from 0.99 to 0.75 with a high frequency signal in her mid right SFA. I angiogram'd her Jul 05, 2011, revealing proximal right SFA lesion along with in-stent restenosis. I revascularized her percutaneously which resulted in marked improvement in her ability to ambulate and in her Dopplers. She did develop arm pain while at [facility] in July of last year and was cath'd at [facility] by Dr. Clydie Braun radially who placed 2 drug-eluting stents in the right coronary artery.    Because of recurrent claudication and abnormal Doppler studies I re-angiogramed to her 05/14/13 revealing high-grade disease in the mid right SFA between 2 previously placed stents. I restarted her with a 6 mm x 100 mm long Viabahn  covered stent with excellent angiographic and clinical result. Her follow-up lower extremity arterial Doppler studies performed 12/04/13 revealed ABIs of 1 bilaterally with a widely patent stent. Since I saw her last she is complaining of some right lateral thigh pain and tingling in her right foot as well as dysesthesia in both  upper extremities. She does have a palpable pedal pulse on the right side. I performed a lower extremity ultrasound on her 09/27/14 revealing a high-frequency signal in the mid right SFA with a decline in her right ABI from 1.0 -.79. I angiogramed her 11/04/14 revealing a 95% stenosis of the leading edge of the previously placed covered stent which I restented using a 6 mm x 25 mm long Viabahn covered stent recent lower extremity arterial Doppler studies performed 07/25/16 revealed widely patent stents in her right SFA with ABIs of approximately 1 bilaterally. She denies claudication. In addition, she denies chest pain or shortness of breath. She did have endoscopy performed back in April because of anemia and a positive occult fecal blood test and this demonstrated a gastric ulcer. Her antiplatelet locations where held for a short period of time.   She was admitted to the hospital with of breath and lower extremity weakness.  2D echo was essentially normal.  Her hemoglobin was in the low 7 range and she was transfused 2 units of packed red blood cells which improved her shortness of breath and claudication.  Dopplers did however show a decline in her right ABI down to the .79 range similar to prior Dopplers when she had restenosed on that side although she currently denies claudication.   Since I saw her a year ago she is remained stable.  She denies chest pain or shortness of breath or claudication.  She did have Doppler studies performed 12/14/2020 revealing a right ABI of 0.90 and a left of 0.98  with widely patent right SFA stents.  Current Meds  Medication Sig   acetaminophen (TYLENOL) 500 MG tablet Take 500-1,000 mg by mouth every 6 (six) hours as needed for mild pain or headache.   alendronate (FOSAMAX) 70 MG tablet Take 70 mg by mouth every Wednesday. Take with a full glass of water on an empty stomach.   atorvastatin (LIPITOR) 40 MG tablet Take 1 tablet (40 mg total) by mouth daily. Please schedule  appointment for refills. (Patient taking differently: Take 40 mg by mouth daily.)   Cholecalciferol (VITAMIN D-3) 25 MCG (1000 UT) CAPS Take 1,000 Units by mouth daily.   clopidogrel (PLAVIX) 75 MG tablet TAKE 1 TABLET BY MOUTH  DAILY   ferrous sulfate 325 (65 FE) MG tablet Take 325 mg by mouth every Wednesday.    losartan (COZAAR) 100 MG tablet Take 1 tablet (100 mg total) by mouth daily.   Omega-3 Fatty Acids (FISH OIL) 1000 MG CAPS Take 1,000 mg by mouth daily.    pantoprazole (PROTONIX) 40 MG tablet Take 40 mg by mouth daily.   vitamin B-12 (CYANOCOBALAMIN) 250 MCG tablet Take 250 mcg by mouth daily.   [DISCONTINUED] CALCIUM PO Take 1 tablet by mouth daily after breakfast.     No Known Allergies  Social History   Socioeconomic History   Marital status: Married    Spouse name: Not on file   Number of children: Not on file   Years of education: Not on file   Highest education level: Not on file  Occupational History   Not on file  Tobacco Use   Smoking status: Every Day    Packs/day: 1.00    Years: 50.00    Pack years: 50.00    Types: Cigarettes, E-cigarettes    Last attempt to quit: 07/14/2010    Years since quitting: 10.5   Smokeless tobacco: Never   Tobacco comments:    Resumed smoking 8 months ago, smokes approximately 7 cigarettes daily.. Counseling given  Substance and Sexual Activity   Alcohol use: Yes    Alcohol/week: 3.0 standard drinks    Types: 3 Glasses of wine per week    Comment: 05/14/2013  "socially; 2-3 glasses of wine twice/wk"   Drug use: No   Sexual activity: Not Currently  Other Topics Concern   Not on file  Social History Narrative   Not on file   Social Determinants of Health   Financial Resource Strain: Not on file  Food Insecurity: Not on file  Transportation Needs: Not on file  Physical Activity: Not on file  Stress: Not on file  Social Connections: Not on file  Intimate Partner Violence: Not on file     Review of Systems: General:  negative for chills, fever, night sweats or weight changes.  Cardiovascular: negative for chest pain, dyspnea on exertion, edema, orthopnea, palpitations, paroxysmal nocturnal dyspnea or shortness of breath Dermatological: negative for rash Respiratory: negative for cough or wheezing Urologic: negative for hematuria Abdominal: negative for nausea, vomiting, diarrhea, bright red blood per rectum, melena, or hematemesis Neurologic: negative for visual changes, syncope, or dizziness All other systems reviewed and are otherwise negative except as noted above.    Blood pressure (!) 170/84, pulse 60, height 5\' 7"  (1.702 m), weight 153 lb 6.4 oz (69.6 kg).  General appearance: alert and no distress Neck: no adenopathy, no carotid bruit, no JVD, supple, symmetrical, trachea midline, and thyroid not enlarged, symmetric, no tenderness/mass/nodules Lungs: clear to auscultation bilaterally Heart: regular rate and rhythm,  S1, S2 normal, no murmur, click, rub or gallop Extremities: extremities normal, atraumatic, no cyanosis or edema Pulses: 2+ and symmetric Skin: Skin color, texture, turgor normal. No rashes or lesions Neurologic: Grossly normal  EKG sinus rhythm at 60 without ST or T wave changes.  I personally reviewed this EKG.  ASSESSMENT AND PLAN:   Claudication in peripheral vascular disease, life style limiting History of peripheral arterial disease status post right SFA intervention by myself in 2006.  She has had multiple interventions since that time most recently 11/04/2014.  Her most recent lower extremity arterial Doppler studies performed 12/14/2020 revealed ABIs of 0.90 on the right and 0.98 on the left with patent stents in the right SFA.  The patient denies claudication.  We will continue to monitor on her noninvasively on an annual basis.  CAD- LAD stent in 2006, chronically occluded OM branch with hx. of Stent to RCA  History of CAD status post LAD stenting by myself back in 2006.  I  catheterized her 08/05/2009 revealing a patent LAD stent, a chronically occluded OM branch with high-grade mid RCA stenosis which I stented using a Taxus Ion drug-eluting stent.  She had 2 drug eluding stents placed at [facility] by Dr. Ellis Parents in 2015 well.  She currently denies chest pain or shortness of breath.  Hyperlipidemia with target LDL less than 70 History of hyperlipidemia on statin therapy with lipid profile performed 11/25/2019 revealing a total cholesterol 142, LDL 72 and HDL 49.  We will recheck a lipid liver profile this morning.  Essential hypertension History of essential hypertension with blood pressure measured today at 170/84.  She did not take her antihypertensive medications this morning as of yet.  She is on losartan.     Runell Gess MD FACP,FACC,FAHA, [facility] 01/13/2021 9:08 AM

## 2021-01-13 NOTE — Patient Instructions (Addendum)
Medication Instructions:  Your physician recommends that you continue on your current medications as directed. Please refer to the Current Medication list given to you today.  *If you need a refill on your cardiac medications before your next appointment, please call your pharmacy*   Lab Work: Your physician recommends that you have labs drawn today: Lipid/liver panel  If you have labs (blood work) drawn today and your tests are completely normal, you will receive your results only by: MyChart Message (if you have MyChart) OR A paper copy in the mail If you have any lab test that is abnormal or we need to change your treatment, we will call you to review the results.  Testing/Procedures: Your physician has requested that you have a lower extremity arterial duplex. This test is an ultrasound of the arteries in the legs. It looks at arterial blood flow in the legs. Allow one hour for Lower Arterial scans. There are no restrictions or special instructions  Your physician has requested that you have an ankle brachial index (ABI). During this test an ultrasound and blood pressure cuff are used to evaluate the arteries that supply the arms and legs with blood. Allow thirty minutes for this exam. There are no restrictions or special instructions.  To be done in Nov. 2023. These procedures are done at [facility].   Follow-Up: At [facility], you and your health needs are our priority.  As part of our continuing mission to provide you with exceptional heart care, we have created designated Provider Care Teams.  These Care Teams include your primary Cardiologist (physician) and Advanced Practice Providers (APPs -  Physician Assistants and Nurse Practitioners) who all work together to provide you with the care you need, when you need it.  We recommend signing up for the patient portal called "MyChart".  Sign up information is provided on this After Visit Summary.  MyChart is used to connect  with patients for Virtual Visits (Telemedicine).  Patients are able to view lab/test results, encounter notes, upcoming appointments, etc.  Non-urgent messages can be sent to your provider as well.   To learn more about what you can do with MyChart, go to [email or web address].    Your next appointment:   12 month(s)  The format for your next appointment:   In Person  Provider:   Nanetta Batty, MD

## 2021-01-13 NOTE — Assessment & Plan Note (Signed)
History of hyperlipidemia on statin therapy with lipid profile performed 11/25/2019 revealing a total cholesterol 142, LDL 72 and HDL 49.  We will recheck a lipid liver profile this morning.

## 2021-01-19 ENCOUNTER — Other Ambulatory Visit: Payer: Self-pay

## 2021-01-19 DIAGNOSIS — E785 Hyperlipidemia, unspecified: Secondary | ICD-10-CM

## 2021-01-19 MED ORDER — ATORVASTATIN CALCIUM 80 MG PO TABS: 80.0000 mg | ORAL_TABLET | Freq: Every day | ORAL | 3 refills | Status: DC

## 2021-01-23 DIAGNOSIS — I2510 Atherosclerotic heart disease of native coronary artery without angina pectoris: Secondary | ICD-10-CM | POA: Diagnosis not present

## 2021-01-23 DIAGNOSIS — Z0000 Encounter for general adult medical examination without abnormal findings: Secondary | ICD-10-CM | POA: Diagnosis not present

## 2021-01-23 DIAGNOSIS — M859 Disorder of bone density and structure, unspecified: Secondary | ICD-10-CM | POA: Diagnosis not present

## 2021-01-23 DIAGNOSIS — R202 Paresthesia of skin: Secondary | ICD-10-CM | POA: Diagnosis not present

## 2021-01-23 DIAGNOSIS — I10 Essential (primary) hypertension: Secondary | ICD-10-CM | POA: Diagnosis not present

## 2021-01-23 DIAGNOSIS — E7800 Pure hypercholesterolemia, unspecified: Secondary | ICD-10-CM | POA: Diagnosis not present

## 2021-01-23 DIAGNOSIS — M1711 Unilateral primary osteoarthritis, right knee: Secondary | ICD-10-CM | POA: Diagnosis not present

## 2021-01-25 ENCOUNTER — Other Ambulatory Visit: Payer: Self-pay | Admitting: Family Medicine

## 2021-01-25 DIAGNOSIS — M859 Disorder of bone density and structure, unspecified: Secondary | ICD-10-CM

## 2021-01-25 DIAGNOSIS — M8580 Other specified disorders of bone density and structure, unspecified site: Secondary | ICD-10-CM

## 2021-02-16 DIAGNOSIS — M1711 Unilateral primary osteoarthritis, right knee: Secondary | ICD-10-CM | POA: Diagnosis not present

## 2021-02-16 DIAGNOSIS — M25561 Pain in right knee: Secondary | ICD-10-CM | POA: Diagnosis not present

## 2021-02-16 DIAGNOSIS — M5416 Radiculopathy, lumbar region: Secondary | ICD-10-CM | POA: Diagnosis not present

## 2021-03-17 ENCOUNTER — Other Ambulatory Visit: Payer: Self-pay | Admitting: Cardiovascular Disease

## 2021-04-04 DIAGNOSIS — R194 Change in bowel habit: Secondary | ICD-10-CM | POA: Diagnosis not present

## 2021-04-04 DIAGNOSIS — R197 Diarrhea, unspecified: Secondary | ICD-10-CM | POA: Diagnosis not present

## 2021-04-14 ENCOUNTER — Other Ambulatory Visit: Payer: Self-pay | Admitting: Gastroenterology

## 2021-04-14 DIAGNOSIS — R198 Other specified symptoms and signs involving the digestive system and abdomen: Secondary | ICD-10-CM

## 2021-04-14 DIAGNOSIS — R109 Unspecified abdominal pain: Secondary | ICD-10-CM

## 2021-04-17 ENCOUNTER — Other Ambulatory Visit: Payer: Self-pay

## 2021-04-17 ENCOUNTER — Encounter (INDEPENDENT_AMBULATORY_CARE_PROVIDER_SITE_OTHER): Payer: Medicare PPO | Admitting: Ophthalmology

## 2021-04-17 DIAGNOSIS — H34832 Tributary (branch) retinal vein occlusion, left eye, with macular edema: Secondary | ICD-10-CM

## 2021-04-17 DIAGNOSIS — H35033 Hypertensive retinopathy, bilateral: Secondary | ICD-10-CM

## 2021-04-17 DIAGNOSIS — H35371 Puckering of macula, right eye: Secondary | ICD-10-CM

## 2021-04-17 DIAGNOSIS — I10 Essential (primary) hypertension: Secondary | ICD-10-CM | POA: Diagnosis not present

## 2021-04-17 DIAGNOSIS — H43813 Vitreous degeneration, bilateral: Secondary | ICD-10-CM

## 2021-04-18 ENCOUNTER — Encounter (INDEPENDENT_AMBULATORY_CARE_PROVIDER_SITE_OTHER): Payer: Medicare PPO | Admitting: Ophthalmology

## 2021-04-18 DIAGNOSIS — H34832 Tributary (branch) retinal vein occlusion, left eye, with macular edema: Secondary | ICD-10-CM

## 2021-04-21 DIAGNOSIS — D225 Melanocytic nevi of trunk: Secondary | ICD-10-CM | POA: Diagnosis not present

## 2021-04-21 DIAGNOSIS — L814 Other melanin hyperpigmentation: Secondary | ICD-10-CM | POA: Diagnosis not present

## 2021-04-21 DIAGNOSIS — L821 Other seborrheic keratosis: Secondary | ICD-10-CM | POA: Diagnosis not present

## 2021-04-21 DIAGNOSIS — Z85828 Personal history of other malignant neoplasm of skin: Secondary | ICD-10-CM | POA: Diagnosis not present

## 2021-04-28 ENCOUNTER — Other Ambulatory Visit: Payer: Self-pay

## 2021-04-28 DIAGNOSIS — E785 Hyperlipidemia, unspecified: Secondary | ICD-10-CM

## 2021-04-28 DIAGNOSIS — R109 Unspecified abdominal pain: Secondary | ICD-10-CM

## 2021-04-28 DIAGNOSIS — R198 Other specified symptoms and signs involving the digestive system and abdomen: Secondary | ICD-10-CM

## 2021-04-28 DIAGNOSIS — M859 Disorder of bone density and structure, unspecified: Secondary | ICD-10-CM

## 2021-04-28 DIAGNOSIS — M8580 Other specified disorders of bone density and structure, unspecified site: Secondary | ICD-10-CM

## 2021-04-29 LAB — HEPATIC FUNCTION PANEL
ALT: 20 IU/L (ref 0–32)
AST: 21 IU/L (ref 0–40)
Albumin: 4.4 g/dL (ref 3.7–4.7)
Alkaline Phosphatase: 92 IU/L (ref 44–121)
Bilirubin Total: 0.4 mg/dL (ref 0.0–1.2)
Bilirubin, Direct: 0.11 mg/dL (ref 0.00–0.40)
Total Protein: 6.6 g/dL (ref 6.0–8.5)

## 2021-04-29 LAB — LIPID PANEL
Chol/HDL Ratio: 3 ratio (ref 0.0–4.4)
Cholesterol, Total: 139 mg/dL (ref 100–199)
HDL: 47 mg/dL (ref >39)
LDL Chol Calc (NIH): 74 mg/dL (ref 0–99)
Triglycerides: 97 mg/dL (ref 0–149)
VLDL Cholesterol Cal: 18 mg/dL (ref 5–40)

## 2021-05-11 ENCOUNTER — Ambulatory Visit
Admission: RE | Admit: 2021-05-11 | Discharge: 2021-05-11 | Disposition: A | Discharge status: Home / Self Care | Payer: Medicare PPO | Source: Ambulatory Visit | Attending: Gastroenterology | Admitting: Gastroenterology

## 2021-05-11 DIAGNOSIS — N3946 Mixed incontinence: Secondary | ICD-10-CM | POA: Diagnosis not present

## 2021-05-11 DIAGNOSIS — I7143 Infrarenal abdominal aortic aneurysm, without rupture: Secondary | ICD-10-CM | POA: Diagnosis not present

## 2021-05-11 DIAGNOSIS — R350 Frequency of micturition: Secondary | ICD-10-CM | POA: Diagnosis not present

## 2021-05-11 MED ORDER — IOPAMIDOL (ISOVUE-300) INJECTION 61%
100.0000 mL | Freq: Once | INTRAVENOUS | Status: AC | PRN
Start: 2021-05-11 — End: 2021-05-11
  Administered 2021-05-11: 100 mL via INTRAVENOUS

## 2021-05-18 ENCOUNTER — Encounter (INDEPENDENT_AMBULATORY_CARE_PROVIDER_SITE_OTHER): Payer: Medicare PPO | Admitting: Ophthalmology

## 2021-05-18 DIAGNOSIS — H35033 Hypertensive retinopathy, bilateral: Secondary | ICD-10-CM | POA: Diagnosis not present

## 2021-05-18 DIAGNOSIS — H43813 Vitreous degeneration, bilateral: Secondary | ICD-10-CM | POA: Diagnosis not present

## 2021-05-18 DIAGNOSIS — I10 Essential (primary) hypertension: Secondary | ICD-10-CM | POA: Diagnosis not present

## 2021-05-18 DIAGNOSIS — H34832 Tributary (branch) retinal vein occlusion, left eye, with macular edema: Secondary | ICD-10-CM

## 2021-06-15 ENCOUNTER — Encounter (INDEPENDENT_AMBULATORY_CARE_PROVIDER_SITE_OTHER): Payer: Medicare PPO | Admitting: Ophthalmology

## 2021-06-15 DIAGNOSIS — H43813 Vitreous degeneration, bilateral: Secondary | ICD-10-CM

## 2021-06-15 DIAGNOSIS — I10 Essential (primary) hypertension: Secondary | ICD-10-CM

## 2021-06-15 DIAGNOSIS — H34832 Tributary (branch) retinal vein occlusion, left eye, with macular edema: Secondary | ICD-10-CM

## 2021-06-15 DIAGNOSIS — H35033 Hypertensive retinopathy, bilateral: Secondary | ICD-10-CM

## 2021-06-21 DIAGNOSIS — N3000 Acute cystitis without hematuria: Secondary | ICD-10-CM | POA: Diagnosis not present

## 2021-06-30 DIAGNOSIS — R194 Change in bowel habit: Secondary | ICD-10-CM | POA: Diagnosis not present

## 2021-06-30 DIAGNOSIS — Z800 Family history of malignant neoplasm of digestive organs: Secondary | ICD-10-CM | POA: Diagnosis not present

## 2021-07-03 DIAGNOSIS — R300 Dysuria: Secondary | ICD-10-CM | POA: Diagnosis not present

## 2021-07-03 DIAGNOSIS — N390 Urinary tract infection, site not specified: Secondary | ICD-10-CM | POA: Diagnosis not present

## 2021-07-13 ENCOUNTER — Encounter (INDEPENDENT_AMBULATORY_CARE_PROVIDER_SITE_OTHER): Payer: Medicare PPO | Admitting: Ophthalmology

## 2021-07-13 ENCOUNTER — Other Ambulatory Visit: Payer: Self-pay | Admitting: Family Medicine

## 2021-07-13 DIAGNOSIS — I10 Essential (primary) hypertension: Secondary | ICD-10-CM | POA: Diagnosis not present

## 2021-07-13 DIAGNOSIS — Z1231 Encounter for screening mammogram for malignant neoplasm of breast: Secondary | ICD-10-CM

## 2021-07-13 DIAGNOSIS — H34832 Tributary (branch) retinal vein occlusion, left eye, with macular edema: Secondary | ICD-10-CM | POA: Diagnosis not present

## 2021-07-13 DIAGNOSIS — H35033 Hypertensive retinopathy, bilateral: Secondary | ICD-10-CM

## 2021-07-13 DIAGNOSIS — H43813 Vitreous degeneration, bilateral: Secondary | ICD-10-CM

## 2021-07-20 ENCOUNTER — Ambulatory Visit
Admission: RE | Admit: 2021-07-20 | Discharge: 2021-07-20 | Disposition: A | Discharge status: Home / Self Care | Payer: Medicare PPO | Source: Ambulatory Visit | Attending: Family Medicine | Admitting: Family Medicine

## 2021-07-20 DIAGNOSIS — M8589 Other specified disorders of bone density and structure, multiple sites: Secondary | ICD-10-CM | POA: Diagnosis not present

## 2021-07-20 DIAGNOSIS — R350 Frequency of micturition: Secondary | ICD-10-CM | POA: Diagnosis not present

## 2021-07-20 DIAGNOSIS — Z780 Asymptomatic menopausal state: Secondary | ICD-10-CM | POA: Diagnosis not present

## 2021-07-27 DIAGNOSIS — C44529 Squamous cell carcinoma of skin of other part of trunk: Secondary | ICD-10-CM | POA: Diagnosis not present

## 2021-07-27 DIAGNOSIS — Z85828 Personal history of other malignant neoplasm of skin: Secondary | ICD-10-CM | POA: Diagnosis not present

## 2021-07-31 DIAGNOSIS — M25561 Pain in right knee: Secondary | ICD-10-CM | POA: Diagnosis not present

## 2021-08-01 DIAGNOSIS — N390 Urinary tract infection, site not specified: Secondary | ICD-10-CM | POA: Diagnosis not present

## 2021-08-03 ENCOUNTER — Ambulatory Visit
Admission: RE | Admit: 2021-08-03 | Discharge: 2021-08-03 | Disposition: A | Discharge status: Home / Self Care | Payer: Medicare PPO | Source: Ambulatory Visit | Attending: Family Medicine | Admitting: Family Medicine

## 2021-08-03 DIAGNOSIS — F17210 Nicotine dependence, cigarettes, uncomplicated: Secondary | ICD-10-CM

## 2021-08-03 DIAGNOSIS — Z87891 Personal history of nicotine dependence: Secondary | ICD-10-CM

## 2021-08-11 ENCOUNTER — Encounter (INDEPENDENT_AMBULATORY_CARE_PROVIDER_SITE_OTHER): Payer: Medicare PPO | Admitting: Ophthalmology

## 2021-08-11 DIAGNOSIS — H43813 Vitreous degeneration, bilateral: Secondary | ICD-10-CM | POA: Diagnosis not present

## 2021-08-11 DIAGNOSIS — H35033 Hypertensive retinopathy, bilateral: Secondary | ICD-10-CM

## 2021-08-11 DIAGNOSIS — I10 Essential (primary) hypertension: Secondary | ICD-10-CM

## 2021-08-11 DIAGNOSIS — H34832 Tributary (branch) retinal vein occlusion, left eye, with macular edema: Secondary | ICD-10-CM | POA: Diagnosis not present

## 2021-08-15 IMAGING — CT CT CHEST LUNG CANCER SCREENING LOW DOSE W/O CM
2 of 5 series · 15 of 40 positions shown, 18 images · non-contrast
Comparison: 11/28/2016

CLINICAL DATA: Lung cancer screening. Fifty pack-year history.
Current asymptomatic smoker.

EXAM:
CT CHEST WITHOUT CONTRAST LOW-DOSE FOR LUNG CANCER SCREENING
TECHNIQUE: Multidetector CT imaging of the chest was performed following the
standard protocol without IV contrast.

[Series 4: lung 1.00 br44 cor · coronal · 0.67mm/px · 3 of 352 slices shown]
[im 71/352  lung]
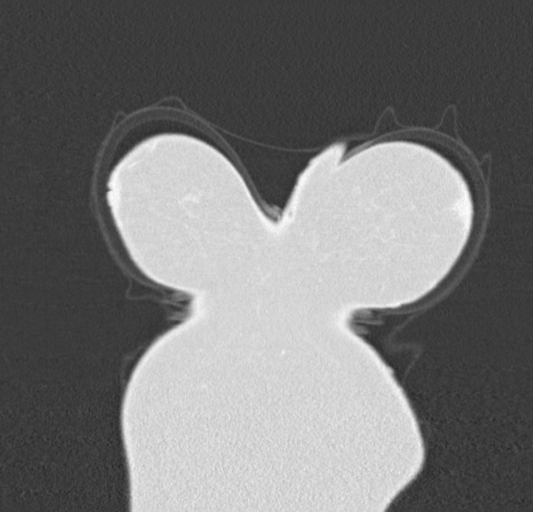
[im 141/352  lung]
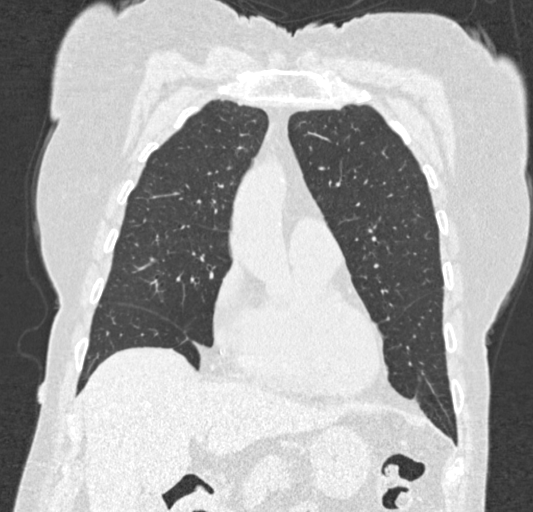
[im 211/352  lung]
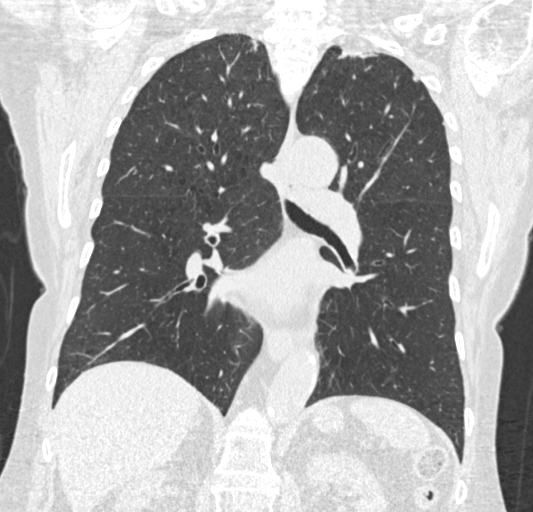

[Series 9: lung 1.00 br60 axial · axial · 0.70mm/px · z∈[-1097,-789]mm · 12 of 342 slices shown, 15 images]
[im 17/342  mediastinal]
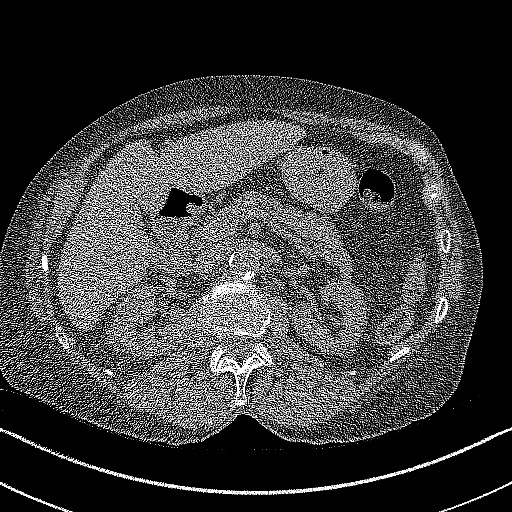
[im 17/342  lung]
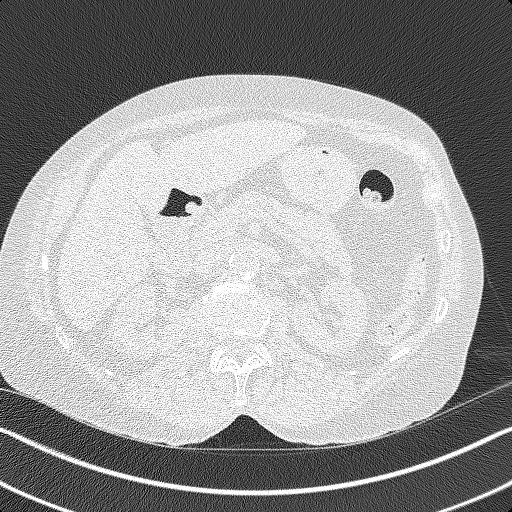
[im 49/342  lung]
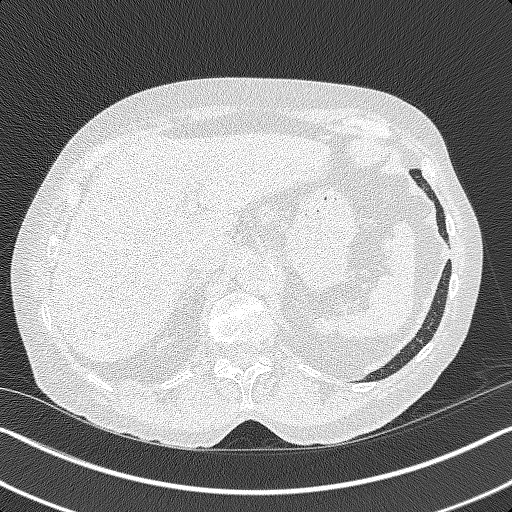
[im 82/342  lung]
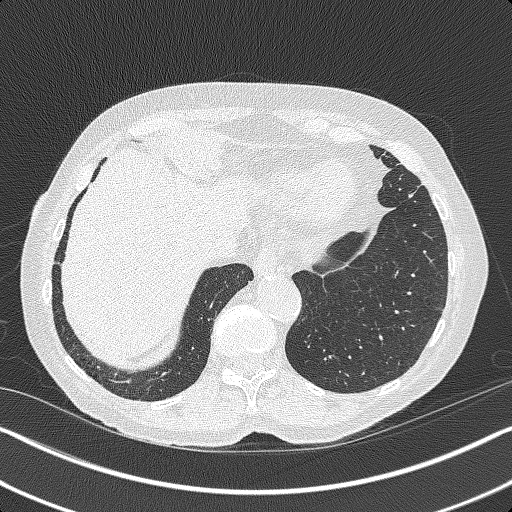
[im 98/342  lung]
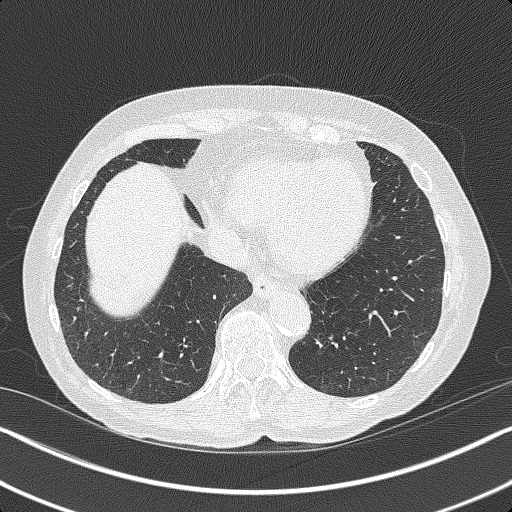
[im 130/342  mediastinal]
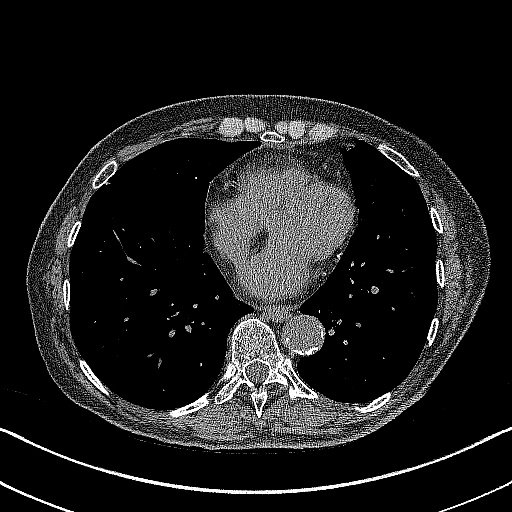
[im 130/342  lung]
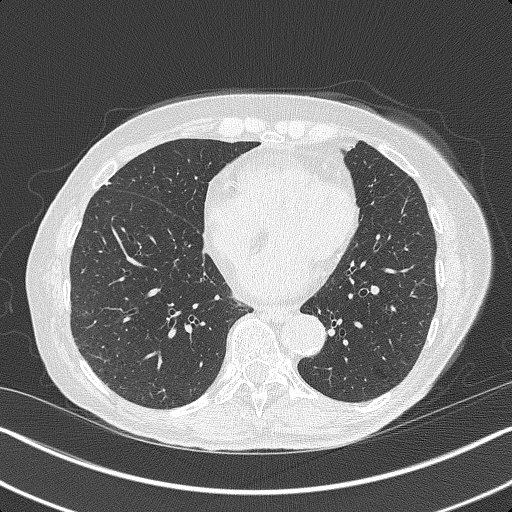
[im 163/342  lung]
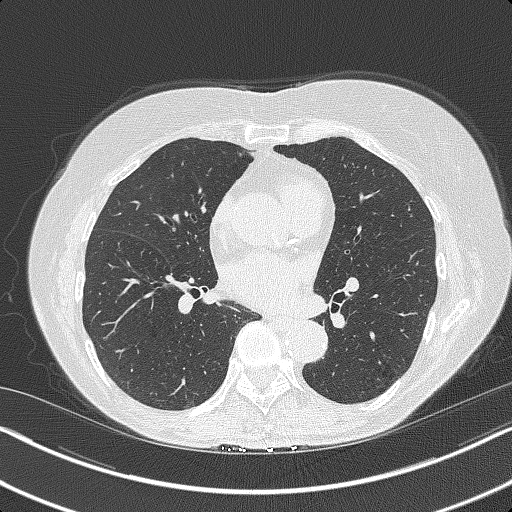
[im 179/342  lung]
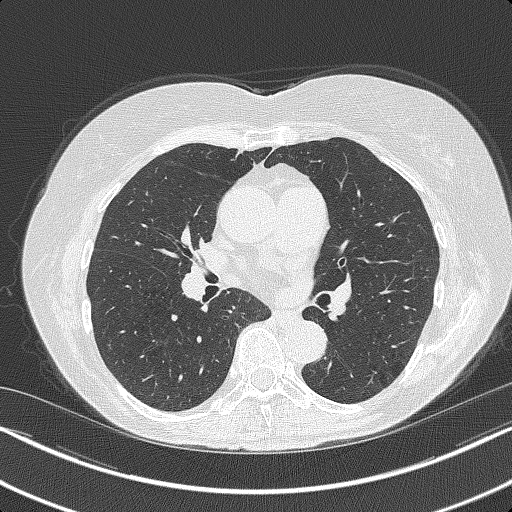
[im 212/342  lung]
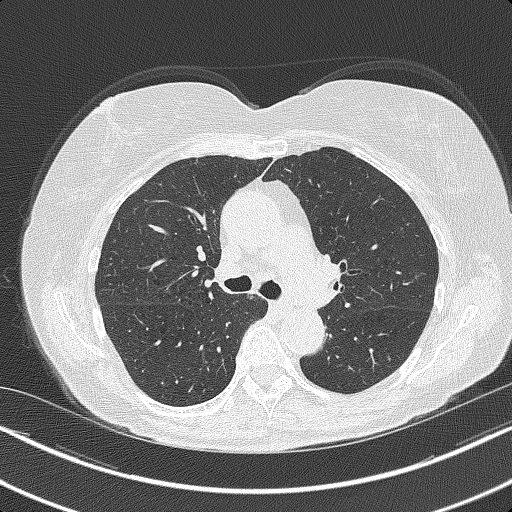
[im 244/342  mediastinal]
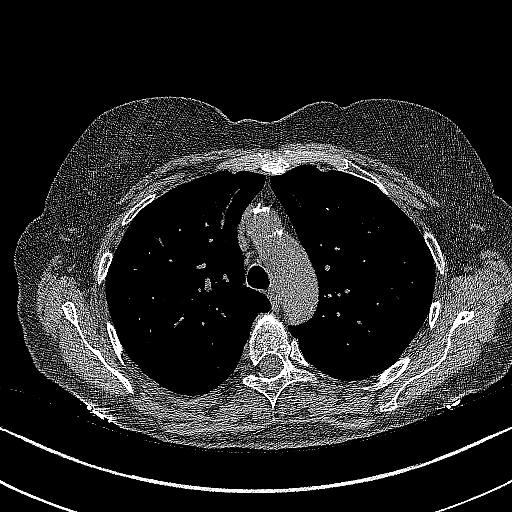
[im 244/342  lung]
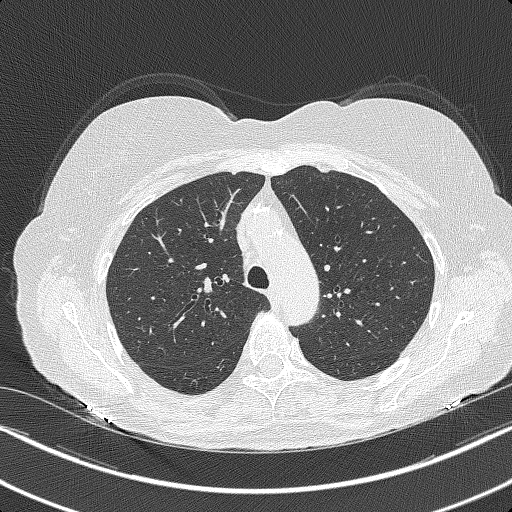
[im 260/342  lung]
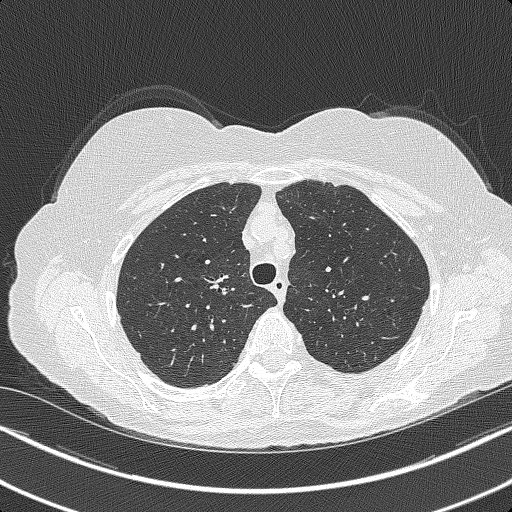
[im 293/342  lung]
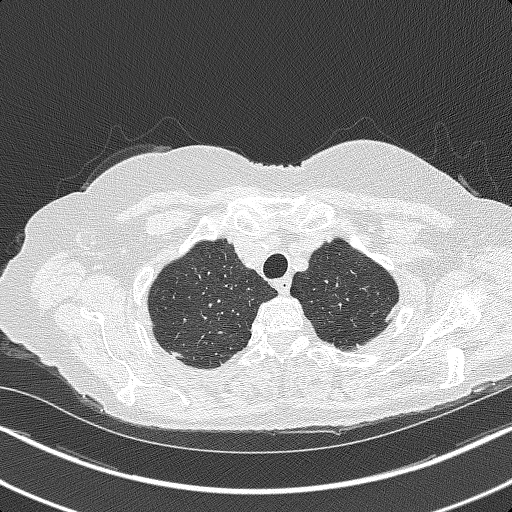
[im 325/342  lung]
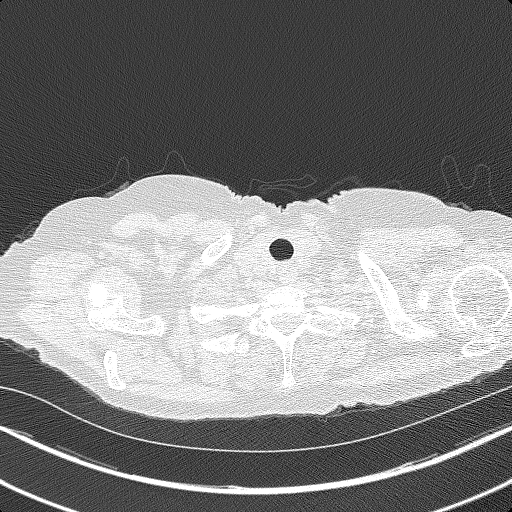

[15 of 40 positions shown; findings below may reference images not displayed]

FINDINGS: Cardiovascular: The heart size appears normal. No pericardial
effusion. Aortic atherosclerosis. Coronary artery calcifications.

Mediastinum/Nodes: No enlarged mediastinal, hilar, or axillary lymph
nodes. Thyroid gland, trachea, and esophagus demonstrate no
significant findings.

Lungs/Pleura: Biapical pleuroparenchymal scarring. Centrilobular and
paraseptal emphysema. No pleural effusion, airspace consolidation,
or atelectasis. There is a single tiny nodule within the superior
segment of left lower lobe with a mean derived diameter of 2 mm.

Upper Abdomen: No acute abnormality within the imaged portions of
the upper abdomen. Bilateral renal calcifications are likely
vascular in etiology. Aortic atherosclerosis.

Musculoskeletal: No acute or suspicious findings. Unchanged
sclerotic lesion within the posterior aspect of the T9 vertebral
body measures 9 mm and is unchanged from the previous exam the
likely representing a benign lesion. Remote healed left lateral rib
fracture noted.
IMPRESSION: 1. Lung-RADS 2, benign appearance or behavior. Continue annual
screening with low-dose chest CT without contrast in 12 months.
2. Coronary artery calcifications noted.

Aortic Atherosclerosis (V8RDT-ODL.L) and Emphysema (V8RDT-ENM.W).

## 2021-08-17 ENCOUNTER — Telehealth: Payer: Self-pay

## 2021-08-17 ENCOUNTER — Other Ambulatory Visit: Payer: Self-pay | Admitting: Gastroenterology

## 2021-08-17 DIAGNOSIS — R198 Other specified symptoms and signs involving the digestive system and abdomen: Secondary | ICD-10-CM | POA: Diagnosis not present

## 2021-08-17 DIAGNOSIS — Z8679 Personal history of other diseases of the circulatory system: Secondary | ICD-10-CM | POA: Diagnosis not present

## 2021-08-17 DIAGNOSIS — R194 Change in bowel habit: Secondary | ICD-10-CM | POA: Diagnosis not present

## 2021-08-17 DIAGNOSIS — K838 Other specified diseases of biliary tract: Secondary | ICD-10-CM

## 2021-08-17 DIAGNOSIS — R109 Unspecified abdominal pain: Secondary | ICD-10-CM | POA: Diagnosis not present

## 2021-08-17 NOTE — Telephone Encounter (Signed)
   Pre-operative Risk Assessment    Patient Name: Sara Dominguez  DOB: January 03, 1941 MRN: [id number]      Request for Surgical Clearance    Procedure:   Colonoscopy  Date of Surgery:  Clearance 08/31/21                                 Surgeon:  Dr. Levora Angel Surgeon's Group or Practice Name:  [facility] Phone number:  [phone] Fax number:  [phone]   Type of Clearance Requested:   - Pharmacy:  Hold Clopidogrel (Plavix)     Type of Anesthesia:   Propofol   Additional requests/questions:    Signed, Daric Koren C Astella Desir   08/17/2021, 3:00 PM

## 2021-08-18 NOTE — Telephone Encounter (Signed)
   Patient Name: Sara Dominguez  DOB: 08-Oct-1940 MRN: [id number]  Primary Cardiologist: Nanetta Batty, MD  Chart reviewed as part of pre-operative protocol coverage. Given past medical history and time since last visit, based on ACC/AHA guidelines, Sara Dominguez would be at acceptable risk for the planned procedure without further cardiovascular testing.   The patient was advised that if she develops new symptoms prior to surgery to contact our office to arrange for a follow-up visit, and she verbalized understanding.  Per office protocol, patient may hold Plavix for 5 days prior to the procedure.  Please resume Plavix as soon as possible postprocedure, at the discretion of the surgeon.  I will route this recommendation to the requesting party via Epic fax function and remove from pre-op pool.  Please call with questions.  Joylene Grapes, NP 08/18/2021, 9:06 AM

## 2021-08-28 ENCOUNTER — Ambulatory Visit
Admission: RE | Admit: 2021-08-28 | Discharge: 2021-08-28 | Disposition: A | Discharge status: Home / Self Care | Payer: Medicare PPO | Source: Ambulatory Visit | Attending: Gastroenterology | Admitting: Gastroenterology

## 2021-08-28 DIAGNOSIS — K838 Other specified diseases of biliary tract: Secondary | ICD-10-CM

## 2021-08-28 DIAGNOSIS — I714 Abdominal aortic aneurysm, without rupture, unspecified: Secondary | ICD-10-CM | POA: Diagnosis not present

## 2021-08-28 DIAGNOSIS — N390 Urinary tract infection, site not specified: Secondary | ICD-10-CM | POA: Diagnosis not present

## 2021-08-28 MED ORDER — GADOBENATE DIMEGLUMINE 529 MG/ML IV SOLN
14.0000 mL | Freq: Once | INTRAVENOUS | Status: AC | PRN
  Administered 2021-08-28: 14 mL via INTRAVENOUS

## 2021-08-31 DIAGNOSIS — R194 Change in bowel habit: Secondary | ICD-10-CM | POA: Diagnosis not present

## 2021-08-31 DIAGNOSIS — D175 Benign lipomatous neoplasm of intra-abdominal organs: Secondary | ICD-10-CM | POA: Diagnosis not present

## 2021-08-31 DIAGNOSIS — D125 Benign neoplasm of sigmoid colon: Secondary | ICD-10-CM | POA: Diagnosis not present

## 2021-08-31 DIAGNOSIS — K5730 Diverticulosis of large intestine without perforation or abscess without bleeding: Secondary | ICD-10-CM | POA: Diagnosis not present

## 2021-08-31 DIAGNOSIS — K644 Residual hemorrhoidal skin tags: Secondary | ICD-10-CM | POA: Diagnosis not present

## 2021-08-31 DIAGNOSIS — K648 Other hemorrhoids: Secondary | ICD-10-CM | POA: Diagnosis not present

## 2021-08-31 DIAGNOSIS — Z86010 Personal history of colonic polyps: Secondary | ICD-10-CM | POA: Diagnosis not present

## 2021-08-31 DIAGNOSIS — Z800 Family history of malignant neoplasm of digestive organs: Secondary | ICD-10-CM | POA: Diagnosis not present

## 2021-09-04 DIAGNOSIS — D125 Benign neoplasm of sigmoid colon: Secondary | ICD-10-CM | POA: Diagnosis not present

## 2021-09-18 ENCOUNTER — Encounter (INDEPENDENT_AMBULATORY_CARE_PROVIDER_SITE_OTHER): Payer: Medicare PPO | Admitting: Ophthalmology

## 2021-09-18 DIAGNOSIS — H43813 Vitreous degeneration, bilateral: Secondary | ICD-10-CM

## 2021-09-18 DIAGNOSIS — H35033 Hypertensive retinopathy, bilateral: Secondary | ICD-10-CM | POA: Diagnosis not present

## 2021-09-18 DIAGNOSIS — H34832 Tributary (branch) retinal vein occlusion, left eye, with macular edema: Secondary | ICD-10-CM

## 2021-09-18 DIAGNOSIS — I10 Essential (primary) hypertension: Secondary | ICD-10-CM

## 2021-09-19 DIAGNOSIS — R3915 Urgency of urination: Secondary | ICD-10-CM | POA: Diagnosis not present

## 2021-09-19 DIAGNOSIS — R350 Frequency of micturition: Secondary | ICD-10-CM | POA: Diagnosis not present

## 2021-09-19 DIAGNOSIS — R102 Pelvic and perineal pain: Secondary | ICD-10-CM | POA: Diagnosis not present

## 2021-10-05 DIAGNOSIS — I10 Essential (primary) hypertension: Secondary | ICD-10-CM | POA: Diagnosis not present

## 2021-10-05 DIAGNOSIS — N390 Urinary tract infection, site not specified: Secondary | ICD-10-CM | POA: Diagnosis not present

## 2021-10-05 DIAGNOSIS — R309 Painful micturition, unspecified: Secondary | ICD-10-CM | POA: Diagnosis not present

## 2021-10-11 DIAGNOSIS — Z955 Presence of coronary angioplasty implant and graft: Secondary | ICD-10-CM | POA: Diagnosis not present

## 2021-10-11 DIAGNOSIS — E876 Hypokalemia: Secondary | ICD-10-CM | POA: Diagnosis not present

## 2021-10-11 DIAGNOSIS — N390 Urinary tract infection, site not specified: Secondary | ICD-10-CM | POA: Diagnosis not present

## 2021-10-11 DIAGNOSIS — E872 Acidosis, unspecified: Secondary | ICD-10-CM | POA: Diagnosis not present

## 2021-10-11 DIAGNOSIS — E860 Dehydration: Secondary | ICD-10-CM | POA: Diagnosis not present

## 2021-10-11 DIAGNOSIS — E861 Hypovolemia: Secondary | ICD-10-CM | POA: Diagnosis not present

## 2021-10-11 DIAGNOSIS — R9431 Abnormal electrocardiogram [ECG] [EKG]: Secondary | ICD-10-CM | POA: Diagnosis not present

## 2021-10-11 DIAGNOSIS — R748 Abnormal levels of other serum enzymes: Secondary | ICD-10-CM | POA: Diagnosis not present

## 2021-10-11 DIAGNOSIS — R112 Nausea with vomiting, unspecified: Secondary | ICD-10-CM | POA: Diagnosis not present

## 2021-10-11 DIAGNOSIS — I959 Hypotension, unspecified: Secondary | ICD-10-CM | POA: Diagnosis not present

## 2021-10-11 DIAGNOSIS — I2510 Atherosclerotic heart disease of native coronary artery without angina pectoris: Secondary | ICD-10-CM | POA: Diagnosis not present

## 2021-10-11 DIAGNOSIS — N200 Calculus of kidney: Secondary | ICD-10-CM | POA: Diagnosis not present

## 2021-10-11 DIAGNOSIS — A419 Sepsis, unspecified organism: Secondary | ICD-10-CM | POA: Diagnosis not present

## 2021-10-11 DIAGNOSIS — R42 Dizziness and giddiness: Secondary | ICD-10-CM | POA: Diagnosis not present

## 2021-10-11 DIAGNOSIS — E871 Hypo-osmolality and hyponatremia: Secondary | ICD-10-CM | POA: Diagnosis not present

## 2021-10-12 DIAGNOSIS — E876 Hypokalemia: Secondary | ICD-10-CM | POA: Diagnosis not present

## 2021-10-12 DIAGNOSIS — E785 Hyperlipidemia, unspecified: Secondary | ICD-10-CM | POA: Diagnosis not present

## 2021-10-12 DIAGNOSIS — E860 Dehydration: Secondary | ICD-10-CM | POA: Diagnosis not present

## 2021-10-12 DIAGNOSIS — I739 Peripheral vascular disease, unspecified: Secondary | ICD-10-CM | POA: Diagnosis not present

## 2021-10-12 DIAGNOSIS — Z720 Tobacco use: Secondary | ICD-10-CM | POA: Diagnosis not present

## 2021-10-12 DIAGNOSIS — N390 Urinary tract infection, site not specified: Secondary | ICD-10-CM | POA: Diagnosis not present

## 2021-10-12 DIAGNOSIS — R778 Other specified abnormalities of plasma proteins: Secondary | ICD-10-CM | POA: Diagnosis not present

## 2021-10-12 DIAGNOSIS — I714 Abdominal aortic aneurysm, without rupture, unspecified: Secondary | ICD-10-CM | POA: Diagnosis not present

## 2021-10-13 DIAGNOSIS — E785 Hyperlipidemia, unspecified: Secondary | ICD-10-CM | POA: Diagnosis not present

## 2021-10-13 DIAGNOSIS — N390 Urinary tract infection, site not specified: Secondary | ICD-10-CM | POA: Diagnosis not present

## 2021-10-13 DIAGNOSIS — E860 Dehydration: Secondary | ICD-10-CM | POA: Diagnosis not present

## 2021-10-13 DIAGNOSIS — I714 Abdominal aortic aneurysm, without rupture, unspecified: Secondary | ICD-10-CM | POA: Diagnosis not present

## 2021-10-13 DIAGNOSIS — R778 Other specified abnormalities of plasma proteins: Secondary | ICD-10-CM | POA: Diagnosis not present

## 2021-10-13 DIAGNOSIS — E876 Hypokalemia: Secondary | ICD-10-CM | POA: Diagnosis not present

## 2021-10-13 DIAGNOSIS — I739 Peripheral vascular disease, unspecified: Secondary | ICD-10-CM | POA: Diagnosis not present

## 2021-10-13 DIAGNOSIS — I3481 Nonrheumatic mitral (valve) annulus calcification: Secondary | ICD-10-CM | POA: Diagnosis not present

## 2021-10-13 DIAGNOSIS — I358 Other nonrheumatic aortic valve disorders: Secondary | ICD-10-CM | POA: Diagnosis not present

## 2021-10-13 DIAGNOSIS — I340 Nonrheumatic mitral (valve) insufficiency: Secondary | ICD-10-CM | POA: Diagnosis not present

## 2021-10-13 DIAGNOSIS — Z720 Tobacco use: Secondary | ICD-10-CM | POA: Diagnosis not present

## 2021-10-13 DIAGNOSIS — I517 Cardiomegaly: Secondary | ICD-10-CM | POA: Diagnosis not present

## 2021-11-09 ENCOUNTER — Encounter (INDEPENDENT_AMBULATORY_CARE_PROVIDER_SITE_OTHER): Payer: Medicare PPO | Admitting: Ophthalmology

## 2021-11-09 DIAGNOSIS — H35033 Hypertensive retinopathy, bilateral: Secondary | ICD-10-CM | POA: Diagnosis not present

## 2021-11-09 DIAGNOSIS — H43813 Vitreous degeneration, bilateral: Secondary | ICD-10-CM

## 2021-11-09 DIAGNOSIS — H34832 Tributary (branch) retinal vein occlusion, left eye, with macular edema: Secondary | ICD-10-CM

## 2021-11-09 DIAGNOSIS — I10 Essential (primary) hypertension: Secondary | ICD-10-CM | POA: Diagnosis not present

## 2021-11-13 DIAGNOSIS — Z85828 Personal history of other malignant neoplasm of skin: Secondary | ICD-10-CM | POA: Diagnosis not present

## 2021-11-13 DIAGNOSIS — D692 Other nonthrombocytopenic purpura: Secondary | ICD-10-CM | POA: Diagnosis not present

## 2021-11-13 DIAGNOSIS — L905 Scar conditions and fibrosis of skin: Secondary | ICD-10-CM | POA: Diagnosis not present

## 2021-11-13 DIAGNOSIS — L821 Other seborrheic keratosis: Secondary | ICD-10-CM | POA: Diagnosis not present

## 2021-11-15 ENCOUNTER — Ambulatory Visit
Admission: RE | Admit: 2021-11-15 | Discharge: 2021-11-15 | Disposition: A | Discharge status: Home / Self Care | Payer: Medicare PPO | Source: Ambulatory Visit | Attending: Family Medicine | Admitting: Family Medicine

## 2021-11-15 DIAGNOSIS — Z1231 Encounter for screening mammogram for malignant neoplasm of breast: Secondary | ICD-10-CM

## 2021-11-21 DIAGNOSIS — E860 Dehydration: Secondary | ICD-10-CM | POA: Diagnosis not present

## 2021-11-21 DIAGNOSIS — N390 Urinary tract infection, site not specified: Secondary | ICD-10-CM | POA: Diagnosis not present

## 2021-11-21 DIAGNOSIS — G629 Polyneuropathy, unspecified: Secondary | ICD-10-CM | POA: Diagnosis not present

## 2021-11-21 DIAGNOSIS — Z23 Encounter for immunization: Secondary | ICD-10-CM | POA: Diagnosis not present

## 2021-11-24 DIAGNOSIS — R300 Dysuria: Secondary | ICD-10-CM | POA: Diagnosis not present

## 2021-11-24 DIAGNOSIS — R350 Frequency of micturition: Secondary | ICD-10-CM | POA: Diagnosis not present

## 2021-12-04 ENCOUNTER — Other Ambulatory Visit: Payer: Self-pay | Admitting: Cardiovascular Disease

## 2021-12-12 DIAGNOSIS — R109 Unspecified abdominal pain: Secondary | ICD-10-CM | POA: Diagnosis not present

## 2021-12-12 DIAGNOSIS — Z8679 Personal history of other diseases of the circulatory system: Secondary | ICD-10-CM | POA: Diagnosis not present

## 2021-12-12 DIAGNOSIS — R194 Change in bowel habit: Secondary | ICD-10-CM | POA: Diagnosis not present

## 2021-12-12 DIAGNOSIS — R198 Other specified symptoms and signs involving the digestive system and abdomen: Secondary | ICD-10-CM | POA: Diagnosis not present

## 2021-12-12 DIAGNOSIS — K838 Other specified diseases of biliary tract: Secondary | ICD-10-CM | POA: Diagnosis not present

## 2021-12-14 ENCOUNTER — Ambulatory Visit (HOSPITAL_COMMUNITY)
Admission: RE | Admit: 2021-12-14 | Discharge: 2021-12-14 | Disposition: A | Discharge status: Home / Self Care | Payer: Medicare PPO | Source: Ambulatory Visit | Attending: Cardiology | Admitting: Cardiology

## 2021-12-14 DIAGNOSIS — I739 Peripheral vascular disease, unspecified: Secondary | ICD-10-CM | POA: Diagnosis not present

## 2021-12-25 DIAGNOSIS — N3946 Mixed incontinence: Secondary | ICD-10-CM | POA: Diagnosis not present

## 2021-12-28 ENCOUNTER — Encounter (INDEPENDENT_AMBULATORY_CARE_PROVIDER_SITE_OTHER): Payer: Medicare PPO | Admitting: Ophthalmology

## 2021-12-28 DIAGNOSIS — H43813 Vitreous degeneration, bilateral: Secondary | ICD-10-CM

## 2021-12-28 DIAGNOSIS — H348322 Tributary (branch) retinal vein occlusion, left eye, stable: Secondary | ICD-10-CM | POA: Diagnosis not present

## 2021-12-28 DIAGNOSIS — I10 Essential (primary) hypertension: Secondary | ICD-10-CM | POA: Diagnosis not present

## 2021-12-28 DIAGNOSIS — H35033 Hypertensive retinopathy, bilateral: Secondary | ICD-10-CM | POA: Diagnosis not present

## 2022-01-10 DIAGNOSIS — N3946 Mixed incontinence: Secondary | ICD-10-CM | POA: Diagnosis not present

## 2022-01-10 DIAGNOSIS — R350 Frequency of micturition: Secondary | ICD-10-CM | POA: Diagnosis not present

## 2022-01-17 DIAGNOSIS — G609 Hereditary and idiopathic neuropathy, unspecified: Secondary | ICD-10-CM | POA: Diagnosis not present

## 2022-01-17 DIAGNOSIS — M722 Plantar fascial fibromatosis: Secondary | ICD-10-CM | POA: Diagnosis not present

## 2022-01-17 DIAGNOSIS — M79672 Pain in left foot: Secondary | ICD-10-CM | POA: Diagnosis not present

## 2022-01-31 DIAGNOSIS — I499 Cardiac arrhythmia, unspecified: Secondary | ICD-10-CM | POA: Diagnosis not present

## 2022-01-31 DIAGNOSIS — Z23 Encounter for immunization: Secondary | ICD-10-CM | POA: Diagnosis not present

## 2022-01-31 DIAGNOSIS — E7800 Pure hypercholesterolemia, unspecified: Secondary | ICD-10-CM | POA: Diagnosis not present

## 2022-01-31 DIAGNOSIS — K219 Gastro-esophageal reflux disease without esophagitis: Secondary | ICD-10-CM | POA: Diagnosis not present

## 2022-01-31 DIAGNOSIS — Z720 Tobacco use: Secondary | ICD-10-CM | POA: Diagnosis not present

## 2022-01-31 DIAGNOSIS — Z6824 Body mass index (BMI) 24.0-24.9, adult: Secondary | ICD-10-CM | POA: Diagnosis not present

## 2022-01-31 DIAGNOSIS — M859 Disorder of bone density and structure, unspecified: Secondary | ICD-10-CM | POA: Diagnosis not present

## 2022-02-01 ENCOUNTER — Encounter (INDEPENDENT_AMBULATORY_CARE_PROVIDER_SITE_OTHER): Payer: Medicare PPO | Admitting: Ophthalmology

## 2022-02-01 DIAGNOSIS — H35033 Hypertensive retinopathy, bilateral: Secondary | ICD-10-CM | POA: Diagnosis not present

## 2022-02-01 DIAGNOSIS — H348322 Tributary (branch) retinal vein occlusion, left eye, stable: Secondary | ICD-10-CM

## 2022-02-01 DIAGNOSIS — I10 Essential (primary) hypertension: Secondary | ICD-10-CM | POA: Diagnosis not present

## 2022-02-01 DIAGNOSIS — H43813 Vitreous degeneration, bilateral: Secondary | ICD-10-CM | POA: Diagnosis not present

## 2022-02-02 ENCOUNTER — Telehealth: Payer: Self-pay | Admitting: Cardiovascular Disease

## 2022-02-02 NOTE — Telephone Encounter (Signed)
    Primary Cardiologist:Jonathan [name], MD  Chart reviewed as part of pre-operative protocol coverage. Because of Sara Dominguez's past medical history and time since last visit, he/she will require a follow-up visit in order to better assess preoperative cardiovascular risk.  Pre-op covering staff: - Please schedule in office appointment and call patient to inform them. - Please contact requesting surgeon's office via preferred method (i.e, phone, fax) to inform them of need for appointment prior to surgery.  If applicable, this message will also be routed to pharmacy pool and/or primary cardiologist for input on holding anticoagulant/antiplatelet agent as requested below so that this information is available at time of patient's appointment.   Ronney Asters, NP  02/02/2022, 1:41 PM

## 2022-02-02 NOTE — Telephone Encounter (Signed)
   Pre-operative Risk Assessment    Patient Name: Sara Dominguez  DOB: Aug 10, 1940 MRN: [id number]      Request for Surgical Clearance    Procedure:   Intravesicular Botox  Date of Surgery:  Clearance 03/02/22                                 Surgeon:  Dr. Jerilee Field Surgeon's Group or Practice Name:  [facility] Phone number:  [phone], [phone]  Fax number:  [phone]   Type of Clearance Requested:   - Pharmacy:  Hold Clopidogrel (Plavix)     Type of Anesthesia:  Local    Additional requests/questions:   Caller stated they will need to know how long to hold the patient's Plavix.  Signed, Annetta Maw   02/02/2022, 1:23 PM

## 2022-02-02 NOTE — Telephone Encounter (Signed)
Pt has been scheduled to see Dr. [name] 02/09/22 @ 1:30 for pre op clearance. I will update all parties involved in this matter.

## 2022-02-09 ENCOUNTER — Ambulatory Visit: Payer: Medicare PPO | Attending: Cardiovascular Disease | Admitting: Cardiovascular Disease

## 2022-02-09 ENCOUNTER — Encounter: Payer: Self-pay | Admitting: Cardiovascular Disease

## 2022-02-09 ENCOUNTER — Ambulatory Visit (INDEPENDENT_AMBULATORY_CARE_PROVIDER_SITE_OTHER): Payer: Medicare PPO

## 2022-02-09 ENCOUNTER — Other Ambulatory Visit: Payer: Self-pay | Admitting: Cardiovascular Disease

## 2022-02-09 VITALS — BP 118/66 | HR 75 | Ht 67.0 in | Wt 150.6 lb

## 2022-02-09 DIAGNOSIS — I499 Cardiac arrhythmia, unspecified: Secondary | ICD-10-CM

## 2022-02-09 DIAGNOSIS — I491 Atrial premature depolarization: Secondary | ICD-10-CM

## 2022-02-09 DIAGNOSIS — I739 Peripheral vascular disease, unspecified: Secondary | ICD-10-CM

## 2022-02-09 DIAGNOSIS — E785 Hyperlipidemia, unspecified: Secondary | ICD-10-CM

## 2022-02-09 DIAGNOSIS — I2510 Atherosclerotic heart disease of native coronary artery without angina pectoris: Secondary | ICD-10-CM

## 2022-02-09 DIAGNOSIS — I10 Essential (primary) hypertension: Secondary | ICD-10-CM

## 2022-02-09 NOTE — Progress Notes (Unsigned)
Enrolled for [employer] to mail a ZIO XT long term holter monitor to the patients address on file.  

## 2022-02-09 NOTE — Assessment & Plan Note (Signed)
History of essential hypertension blood pressure measured today 118/66.  She is on hydrochlorothiazide and losartan.

## 2022-02-09 NOTE — Assessment & Plan Note (Signed)
History of hyperlipidemia on high-dose atorvastatin with lipid profile performed 04/28/2021 revealing total cholesterol 139, LDL 74 and HDL 47.

## 2022-02-09 NOTE — Progress Notes (Addendum)
02/09/2022 Sara Dominguez [facility]   [birth year]  [id number]  Primary Physician [name], MD Primary Cardiologist: Runell Gess MD FACP, [place], [place], [place]  HPI:  Sara Dominguez is a 81 y.o.   thin-appearing, married Caucasian female, mother of 2, grandmother to 3 grandchildren who I last saw in the office 01/13/2021.Marland Kitchen She has a history of CAD status post LAD stenting by myself in 2006, as well as right SFA stenting several months thereafter. She just stopped smoking in July of last year. I catheterized her August 05, 2009, revealing a patent LAD stent and a chronically occluded OM branch with high-grade mid RCA lesion which I stented with a Taxus Ion drug-eluting stent. At that time, I demonstrated a small abdominal aortic aneurysm measuring 2.3 x 2.4 cm. In addition, she developed recurrent lower extremity pain with Dopplers that showed a decrease in the right ABI from 0.99 to 0.75 with a high frequency signal in her mid right SFA. I angiogram'd her Jul 05, 2011, revealing proximal right SFA lesion along with in-stent restenosis. I revascularized her percutaneously which resulted in marked improvement in her ability to ambulate and in her Dopplers. She did develop arm pain while at [facility] in July of last year and was cath'd at [facility] by Dr. Ellis Parents radially who placed 2 drug-eluting stents in the right coronary artery.    Because of recurrent claudication and abnormal Doppler studies I re-angiogramed to her 05/14/13 revealing high-grade disease in the mid right SFA between 2 previously placed stents. I restarted her with a 6 mm x 100 mm long Viabahn  covered stent with excellent angiographic and clinical result. Her follow-up lower extremity arterial Doppler studies performed 12/04/13 revealed ABIs of 1 bilaterally with a widely patent stent. Since I saw her last she is complaining of some right lateral thigh pain and tingling in her right foot as well as dysesthesia in both  upper extremities. She does have a palpable pedal pulse on the right side. I performed a lower extremity ultrasound on her 09/27/14 revealing a high-frequency signal in the mid right SFA with a decline in her right ABI from 1.0 -.79. I angiogramed her 11/04/14 revealing a 95% stenosis of the leading edge of the previously placed covered stent which I restented using a 6 mm x 25 mm long Viabahn covered stent recent lower extremity arterial Doppler studies performed 07/25/16 revealed widely patent stents in her right SFA with ABIs of approximately 1 bilaterally. She denies claudication. In addition, she denies chest pain or shortness of breath. She did have endoscopy performed back in April because of anemia and a positive occult fecal blood test and this demonstrated a gastric ulcer. Her antiplatelet locations where held for a short period of time.   She was admitted to the hospital with of breath and lower extremity weakness.  2D echo was essentially normal.  Her hemoglobin was in the low 7 range and she was transfused 2 units of packed red blood cells which improved her shortness of breath and claudication.  Dopplers did however show a decline in her right ABI down to the .79 range similar to prior Dopplers when she had restenosed on that side although she currently denies claudication.   Since I saw her a year ago she is remained stable.  She denies chest pain or shortness of breath or claudication.  She did have Doppler studies performed 12/14/2021 revealing a right ABI of 0.84, left of 0.75 with a  patent right SFA stent.  She did see her primary care provider, Dr. Kathyrn Lass, recently he said that she had "an irregular heart beat".  Patient apparently needs a urologic procedure in the near future which she will be cleared for at low risk.  Current Meds  Medication Sig   alendronate (FOSAMAX) 70 MG tablet Take 70 mg by mouth every Wednesday. Take with a full glass of water on an empty stomach.    atorvastatin (LIPITOR) 80 MG tablet TAKE 1 TABLET EVERY DAY   Cholecalciferol (VITAMIN D-3) 25 MCG (1000 UT) CAPS Take 1,000 Units by mouth daily.   cyanocobalamin (VITAMIN B12) 1000 MCG tablet Take 1,000 mcg by mouth daily.   ferrous sulfate 325 (65 FE) MG tablet Take 325 mg by mouth every Wednesday.    gabapentin (NEURONTIN) 100 MG capsule Take 100 mg by mouth daily in the afternoon.   hydrochlorothiazide (HYDRODIURIL) 12.5 MG tablet Take 12.5 mg by mouth daily.   losartan (COZAAR) 100 MG tablet Take 1 tablet (100 mg total) by mouth daily.   MAGNESIUM PO Take 1 tablet by mouth daily in the afternoon.   Omega-3 Fatty Acids (FISH OIL) 1000 MG CAPS Take 1,000 mg by mouth daily.    pantoprazole (PROTONIX) 40 MG tablet Take 40 mg by mouth daily.     No Known Allergies  Social History   Socioeconomic History   Marital status: Married    Spouse name: Not on file   Number of children: Not on file   Years of education: Not on file   Highest education level: Not on file  Occupational History   Not on file  Tobacco Use   Smoking status: Every Day    Packs/day: 1.00    Years: 50.00    Total pack years: 50.00    Types: Cigarettes, E-cigarettes    Last attempt to quit: 07/14/2010    Years since quitting: 11.5   Smokeless tobacco: Never   Tobacco comments:    Resumed smoking 8 months ago, smokes approximately 7 cigarettes daily.. Counseling given    02/09/2022 Patient smokes 3 daily  Substance and Sexual Activity   Alcohol use: Yes    Alcohol/week: 3.0 standard drinks of alcohol    Types: 3 Glasses of wine per week    Comment: 05/14/2013  "socially; 2-3 glasses of wine twice/wk"   Drug use: No   Sexual activity: Not Currently  Other Topics Concern   Not on file  Social History Narrative   Not on file   Social Determinants of Health   Financial Resource Strain: Not on file  Food Insecurity: Not on file  Transportation Needs: Not on file  Physical Activity: Not on file  Stress: Not  on file  Social Connections: Not on file  Intimate Partner Violence: Not on file     Review of Systems: General: negative for chills, fever, night sweats or weight changes.  Cardiovascular: negative for chest pain, dyspnea on exertion, edema, orthopnea, palpitations, paroxysmal nocturnal dyspnea or shortness of breath Dermatological: negative for rash Respiratory: negative for cough or wheezing Urologic: negative for hematuria Abdominal: negative for nausea, vomiting, diarrhea, bright red blood per rectum, melena, or hematemesis Neurologic: negative for visual changes, syncope, or dizziness All other systems reviewed and are otherwise negative except as noted above.    Blood pressure 118/66, pulse 75, height 5\' 7"  (1.702 m), weight 150 lb 9.6 oz (68.3 kg).  General appearance: alert and no distress Neck: no adenopathy, no carotid bruit,  no JVD, supple, symmetrical, trachea midline, and thyroid not enlarged, symmetric, no tenderness/mass/nodules Lungs: clear to auscultation bilaterally Heart: regular rate and rhythm, S1, S2 normal, no murmur, click, rub or gallop Extremities: extremities normal, atraumatic, no cyanosis or edema Pulses: 2+ and symmetric Skin: Skin color, texture, turgor normal. No rashes or lesions Neurologic: Grossly normal  EKG sinus rhythm at 75 with occasional PACs and nonspecific ST and T wave changes.  Personally reviewed this EKG.  ASSESSMENT AND PLAN:   Claudication in peripheral vascular disease, life style limiting History of peripheral arterial disease status post multiple right SFA interventions the most recent of which was 11/04/2014.  She denies claudication.  Her most recent Doppler studies performed 12/14/2021 revealed a right ABI 0.84 and left of 0.75.  Her right SFA stents appeared widely patent.  This to be repeated on an annual basis.  CAD- LAD stent in 2006, chronically occluded OM branch with hx. of Stent to RCA  History of CAD status post LAD  stenting by myself in 2006.  I catheterized her 08/01/2009 revealed a patent LAD stent, chronically occluded OM branch with high-grade mid RCA stenosis which I stented with a Taxus Ion drug-eluting stent.  She has been stable since that time denying chest pain or shortness of breath.  Hyperlipidemia with target LDL less than 70 History of hyperlipidemia on high-dose atorvastatin with lipid profile performed 04/28/2021 revealing total cholesterol 139, LDL 74 and HDL 47.  Essential hypertension History of essential hypertension blood pressure measured today 118/66.  She is on hydrochlorothiazide and losartan.     Lorretta Harp MD FACP,FACC,FAHA, [facility] 02/09/2022 1:45 PM

## 2022-02-09 NOTE — Assessment & Plan Note (Signed)
History of peripheral arterial disease status post multiple right SFA interventions the most recent of which was 11/04/2014.  She denies claudication.  Her most recent Doppler studies performed 12/14/2021 revealed a right ABI 0.84 and left of 0.75.  Her right SFA stents appeared widely patent.  This to be repeated on an annual basis.

## 2022-02-09 NOTE — Assessment & Plan Note (Signed)
History of CAD status post LAD stenting by myself in 2006.  I catheterized her 08/01/2009 revealed a patent LAD stent, chronically occluded OM branch with high-grade mid RCA stenosis which I stented with a Taxus Ion drug-eluting stent.  She has been stable since that time denying chest pain or shortness of breath.

## 2022-02-09 NOTE — Patient Instructions (Addendum)
Medication Instructions:  Your physician recommends that you continue on your current medications as directed. Please refer to the Current Medication list given to you today.  *If you need a refill on your cardiac medications before your next appointment, please call your pharmacy*  Testing/Procedures: ZIO XT- Long Term Monitor Instructions   Your physician has requested you wear a ZIO patch monitor for _7__ days.  This is a single patch monitor.   [employer] supplies one patch monitor per enrollment. Additional stickers are not available. Please do not apply patch if you will be having a Nuclear Stress Test, Echocardiogram, Cardiac CT, MRI, or Chest Xray during the period you would be wearing the monitor. The patch cannot be worn during these tests. You cannot remove and re-apply the ZIO XT patch monitor.  Your ZIO patch monitor will be sent Fed Ex from [employer] directly to your home address. It may take 3-5 days to receive your monitor after you have been enrolled.  Once you have received your monitor, please review the enclosed instructions. Your monitor has already been registered assigning a specific monitor serial # to you.  Billing and Patient Assistance Program Information   We have supplied [employer] with any of your insurance information on file for billing purposes. [employer] offers a sliding scale Patient Assistance Program for patients that do not have insurance, or whose insurance does not completely cover the cost of the ZIO monitor.   You must apply for the Patient Assistance Program to qualify for this discounted rate.     To apply, please call [employer] at [phone], select option 4, then select option 2, and ask to apply for Patient Assistance Program.  Meredeth Ide will ask your household income, and how many people are in your household.  They will quote your out-of-pocket cost based on that information.  [employer] will also be able to set up a 25-month, interest-free payment plan  if needed.  Applying the monitor   Shave hair from upper left chest.  Hold abrader disc by orange tab. Rub abrader in 40 strokes over the upper left chest as indicated in your monitor instructions.  Clean area with 4 enclosed alcohol pads. Let dry.  Apply patch as indicated in monitor instructions. Patch will be placed under collarbone on left side of chest with arrow pointing upward.  Rub patch adhesive wings for 2 minutes. Remove white label marked "1". Remove the white label marked "2". Rub patch adhesive wings for 2 additional minutes.  While looking in a mirror, press and release button in center of patch. A small green light will flash 3-4 times. This will be your only indicator that the monitor has been turned on. ?  Do not shower for the first 24 hours. You may shower after the first 24 hours.  Press the button if you feel a symptom. You will hear a small click. Record Date, Time and Symptom in the Patient Logbook.  When you are ready to remove the patch, follow instructions on the last 2 pages of the Patient Logbook. Stick patch monitor onto the last page of Patient Logbook.  Place Patient Logbook in the blue and white box.  Use locking tab on box and tape box closed securely.  The blue and white box has prepaid postage on it. Please place it in the mailbox as soon as possible. Your physician should have your test results approximately 7 days after the monitor has been mailed back to [facility].  Call [facility]  at [phone] if you have questions regarding your ZIO XT patch monitor. Call them immediately if you see an orange light blinking on your monitor.  If your monitor falls off in less than 4 days, contact our Monitor department at [phone]. ?If your monitor becomes loose or falls off after 4 days call [employer] at [phone] for suggestions on securing your monitor.?  Your physician has requested that you have a lower extremity arterial duplex in  [facility]. This test is an ultrasound of the arteries in the legs. It looks at arterial blood flow in the legs. Allow one hour for Lower Arterial scans. There are no restrictions or special instructions  Follow-Up: At [facility], you and your health needs are our priority.  As part of our continuing mission to provide you with exceptional heart care, we have created designated Provider Care Teams.  These Care Teams include your primary Cardiologist (physician) and Advanced Practice Providers (APPs -  Physician Assistants and Nurse Practitioners) who all work together to provide you with the care you need, when you need it.  We recommend signing up for the patient portal called "MyChart".  Sign up information is provided on this After Visit Summary.  MyChart is used to connect with patients for Virtual Visits (Telemedicine).  Patients are able to view lab/test results, encounter notes, upcoming appointments, etc.  Non-urgent messages can be sent to your provider as well.   To learn more about what you can do with MyChart, go to [email or web address].    Your next appointment:   12 month(s)  The format for your next appointment:   In Person  Provider:   Nanetta Batty, MD        You are cleared from a cardiac standpoint for your upcoming procedure.  Ok to hold Plavix for 7 days prior to procedure.

## 2022-02-17 DIAGNOSIS — I491 Atrial premature depolarization: Secondary | ICD-10-CM | POA: Diagnosis not present

## 2022-02-17 DIAGNOSIS — I499 Cardiac arrhythmia, unspecified: Secondary | ICD-10-CM | POA: Diagnosis not present

## 2022-02-22 NOTE — Telephone Encounter (Signed)
Caller is following up on the status of the patient's clearance.  Caller stated they will need the clearance in writing.

## 2022-02-22 NOTE — Telephone Encounter (Signed)
Dr. Gwenlyn Found, can you please addend your office visit note from 02/09/22 to address  cardiac clearance for upcoming intravesicular botox for [facility]  Thank you, Sharyn Lull

## 2022-02-22 NOTE — Telephone Encounter (Signed)
Per pre op protocol the clearance notes are faxed over to the requesting office, just as we receive a clearance request via fax from the requesting office. All notes are electronic. This has been our pre op protocol for 5+ yrs.  I will forward to our pre op APP for any additional notes.

## 2022-03-01 DIAGNOSIS — I499 Cardiac arrhythmia, unspecified: Secondary | ICD-10-CM | POA: Diagnosis not present

## 2022-03-01 DIAGNOSIS — I491 Atrial premature depolarization: Secondary | ICD-10-CM | POA: Diagnosis not present

## 2022-03-02 DIAGNOSIS — N3946 Mixed incontinence: Secondary | ICD-10-CM | POA: Diagnosis not present

## 2022-03-06 ENCOUNTER — Other Ambulatory Visit: Payer: Self-pay

## 2022-03-06 DIAGNOSIS — I471 Supraventricular tachycardia, unspecified: Secondary | ICD-10-CM

## 2022-03-06 DIAGNOSIS — I491 Atrial premature depolarization: Secondary | ICD-10-CM

## 2022-03-08 DIAGNOSIS — R059 Cough, unspecified: Secondary | ICD-10-CM | POA: Diagnosis not present

## 2022-03-08 DIAGNOSIS — R509 Fever, unspecified: Secondary | ICD-10-CM | POA: Diagnosis not present

## 2022-03-08 DIAGNOSIS — R5383 Other fatigue: Secondary | ICD-10-CM | POA: Diagnosis not present

## 2022-03-08 DIAGNOSIS — U071 COVID-19: Secondary | ICD-10-CM | POA: Diagnosis not present

## 2022-03-16 DIAGNOSIS — N3946 Mixed incontinence: Secondary | ICD-10-CM | POA: Diagnosis not present

## 2022-03-16 DIAGNOSIS — R350 Frequency of micturition: Secondary | ICD-10-CM | POA: Diagnosis not present

## 2022-03-22 ENCOUNTER — Encounter (INDEPENDENT_AMBULATORY_CARE_PROVIDER_SITE_OTHER): Payer: Medicare PPO | Admitting: Ophthalmology

## 2022-03-22 DIAGNOSIS — I10 Essential (primary) hypertension: Secondary | ICD-10-CM

## 2022-03-22 DIAGNOSIS — H43813 Vitreous degeneration, bilateral: Secondary | ICD-10-CM | POA: Diagnosis not present

## 2022-03-22 DIAGNOSIS — H348322 Tributary (branch) retinal vein occlusion, left eye, stable: Secondary | ICD-10-CM | POA: Diagnosis not present

## 2022-03-22 DIAGNOSIS — H35033 Hypertensive retinopathy, bilateral: Secondary | ICD-10-CM | POA: Diagnosis not present

## 2022-03-26 DIAGNOSIS — D23121 Other benign neoplasm of skin of left upper eyelid, including canthus: Secondary | ICD-10-CM | POA: Diagnosis not present

## 2022-03-26 DIAGNOSIS — H5203 Hypermetropia, bilateral: Secondary | ICD-10-CM | POA: Diagnosis not present

## 2022-03-26 DIAGNOSIS — H52223 Regular astigmatism, bilateral: Secondary | ICD-10-CM | POA: Diagnosis not present

## 2022-03-26 DIAGNOSIS — H35033 Hypertensive retinopathy, bilateral: Secondary | ICD-10-CM | POA: Diagnosis not present

## 2022-03-26 DIAGNOSIS — H524 Presbyopia: Secondary | ICD-10-CM | POA: Diagnosis not present

## 2022-03-26 DIAGNOSIS — H34832 Tributary (branch) retinal vein occlusion, left eye, with macular edema: Secondary | ICD-10-CM | POA: Diagnosis not present

## 2022-03-29 DIAGNOSIS — H0279 Other degenerative disorders of eyelid and periocular area: Secondary | ICD-10-CM | POA: Diagnosis not present

## 2022-03-29 DIAGNOSIS — D485 Neoplasm of uncertain behavior of skin: Secondary | ICD-10-CM | POA: Diagnosis not present

## 2022-03-29 DIAGNOSIS — H02834 Dermatochalasis of left upper eyelid: Secondary | ICD-10-CM | POA: Diagnosis not present

## 2022-03-29 DIAGNOSIS — H02831 Dermatochalasis of right upper eyelid: Secondary | ICD-10-CM | POA: Diagnosis not present

## 2022-03-29 DIAGNOSIS — H57813 Brow ptosis, bilateral: Secondary | ICD-10-CM | POA: Diagnosis not present

## 2022-04-11 ENCOUNTER — Institutional Professional Consult (permissible substitution): Payer: Medicare PPO | Admitting: Internal Medicine

## 2022-04-13 DIAGNOSIS — M545 Low back pain, unspecified: Secondary | ICD-10-CM | POA: Diagnosis not present

## 2022-04-20 DIAGNOSIS — I788 Other diseases of capillaries: Secondary | ICD-10-CM | POA: Diagnosis not present

## 2022-04-20 DIAGNOSIS — L570 Actinic keratosis: Secondary | ICD-10-CM | POA: Diagnosis not present

## 2022-04-20 DIAGNOSIS — Z85828 Personal history of other malignant neoplasm of skin: Secondary | ICD-10-CM | POA: Diagnosis not present

## 2022-04-20 DIAGNOSIS — L814 Other melanin hyperpigmentation: Secondary | ICD-10-CM | POA: Diagnosis not present

## 2022-04-20 DIAGNOSIS — L821 Other seborrheic keratosis: Secondary | ICD-10-CM | POA: Diagnosis not present

## 2022-05-03 DIAGNOSIS — L570 Actinic keratosis: Secondary | ICD-10-CM | POA: Diagnosis not present

## 2022-05-03 DIAGNOSIS — L821 Other seborrheic keratosis: Secondary | ICD-10-CM | POA: Diagnosis not present

## 2022-05-03 DIAGNOSIS — D485 Neoplasm of uncertain behavior of skin: Secondary | ICD-10-CM | POA: Diagnosis not present

## 2022-05-10 ENCOUNTER — Encounter (INDEPENDENT_AMBULATORY_CARE_PROVIDER_SITE_OTHER): Payer: Medicare PPO | Admitting: Ophthalmology

## 2022-05-10 DIAGNOSIS — H43813 Vitreous degeneration, bilateral: Secondary | ICD-10-CM | POA: Diagnosis not present

## 2022-05-10 DIAGNOSIS — H35033 Hypertensive retinopathy, bilateral: Secondary | ICD-10-CM | POA: Diagnosis not present

## 2022-05-10 DIAGNOSIS — I10 Essential (primary) hypertension: Secondary | ICD-10-CM

## 2022-05-10 DIAGNOSIS — H348322 Tributary (branch) retinal vein occlusion, left eye, stable: Secondary | ICD-10-CM

## 2022-05-11 DIAGNOSIS — M79671 Pain in right foot: Secondary | ICD-10-CM | POA: Diagnosis not present

## 2022-05-11 DIAGNOSIS — S92514A Nondisplaced fracture of proximal phalanx of right lesser toe(s), initial encounter for closed fracture: Secondary | ICD-10-CM | POA: Diagnosis not present

## 2022-05-15 ENCOUNTER — Encounter: Payer: Self-pay | Admitting: Internal Medicine

## 2022-05-15 ENCOUNTER — Ambulatory Visit: Payer: Medicare PPO | Attending: Internal Medicine | Admitting: Internal Medicine

## 2022-05-15 VITALS — BP 124/60 | HR 89 | Ht 67.0 in | Wt 149.8 lb

## 2022-05-15 DIAGNOSIS — I471 Supraventricular tachycardia, unspecified: Secondary | ICD-10-CM | POA: Diagnosis not present

## 2022-05-15 DIAGNOSIS — I10 Essential (primary) hypertension: Secondary | ICD-10-CM

## 2022-05-15 DIAGNOSIS — I491 Atrial premature depolarization: Secondary | ICD-10-CM | POA: Diagnosis not present

## 2022-05-15 NOTE — Progress Notes (Signed)
HPI Sara Dominguez is referred by Dr. Gwenlyn Found for evaluation of atrial tachycardia, PaC's and PVC's. She is a pleasant 82 yo woman with longstanding COPD and ongoing tobacco abuse. She has stopped smoking many times but always started back. She was found to have frequent PAC';s and SVT on cardiac monitoring. She has known CAD as well as PVD. She denies palpitations or other symptoms associated with heart racing. Her cardiac monitor demonstrated AT, lasting up to 55 seconds and frequent PAC's, rare PVC's.  No Known Allergies   Current Outpatient Medications  Medication Sig Dispense Refill   acetaminophen (TYLENOL) 500 MG tablet Take 500-1,000 mg by mouth every 6 (six) hours as needed for mild pain or headache.     alendronate (FOSAMAX) 70 MG tablet Take 70 mg by mouth every Wednesday. Take with a full glass of water on an empty stomach.     atorvastatin (LIPITOR) 80 MG tablet TAKE 1 TABLET EVERY DAY 90 tablet 10   Cholecalciferol (VITAMIN D-3) 25 MCG (1000 UT) CAPS Take 1,000 Units by mouth daily.     clopidogrel (PLAVIX) 75 MG tablet TAKE 1 TABLET EVERY DAY 90 tablet 3   cyanocobalamin (VITAMIN B12) 1000 MCG tablet Take 1,000 mcg by mouth daily.     ferrous sulfate 325 (65 FE) MG tablet Take 325 mg by mouth every Wednesday.      gabapentin (NEURONTIN) 100 MG capsule Take 100 mg by mouth daily in the afternoon.     hydrALAZINE (APRESOLINE) 10 MG tablet Take 10 mg by mouth 2 (two) times daily.     hydrochlorothiazide (HYDRODIURIL) 12.5 MG tablet Take 12.5 mg by mouth daily.     losartan (COZAAR) 100 MG tablet Take 1 tablet (100 mg total) by mouth daily. 30 tablet 0   MAGNESIUM PO Take 1 tablet by mouth daily in the afternoon.     Omega-3 Fatty Acids (FISH OIL) 1000 MG CAPS Take 1,000 mg by mouth daily.      pantoprazole (PROTONIX) 40 MG tablet Take 40 mg by mouth daily.     vitamin B-12 (CYANOCOBALAMIN) 250 MCG tablet Take 250 mcg by mouth daily.     No current facility-administered  medications for this visit.     Past Medical History:  Diagnosis Date   AAA (abdominal aortic aneurysm), small, history of 2.3 X 2.4 07/06/2011   Abnormal ankle brachial index 07/06/2011   Anxiety    CAD (coronary artery disease), with LAD stent in 2006, chronically occluded OM branch with hx. of Stent to RCA  07/06/2011   Claudication in peripheral vascular disease, life style limiting 07/06/2011   Hyperlipidemia LDL goal < 70 09/08/2012   Hypertension    PAD (peripheral artery disease)     ROS:   All systems reviewed and negative except as noted in the HPI.   Past Surgical History:  Procedure Laterality Date   ABDOMINAL AORTAGRAM N/A 07/05/2011   Procedure: ABDOMINAL AORTAGRAM;  Surgeon: Lorretta Harp, MD;  Location: [facility] CATH LAB;  Service: Cardiovascular;  Laterality: N/A;   ABDOMINAL HYSTERECTOMY  1992   CATARACT EXTRACTION W/ INTRAOCULAR LENS  IMPLANT, BILATERAL Bilateral ~ 2006   CORONARY ANGIOPLASTY WITH STENT PLACEMENT  2006; 2009; 2013   "1 + 1 + 2";  total of 4"   ENTEROSCOPY N/A 08/29/2017   Procedure: ENTEROSCOPY;  Surgeon: Ronald Lobo, MD;  Location: [facility] ENDOSCOPY;  Service: Endoscopy;  Laterality: N/A;   ESOPHAGOGASTRODUODENOSCOPY (EGD) WITH PROPOFOL N/A 06/07/2016   Procedure:  ESOPHAGOGASTRODUODENOSCOPY (EGD) WITH PROPOFOL;  Surgeon: Ronald Lobo, MD;  Location: [facility] ENDOSCOPY;  Service: Endoscopy;  Laterality: N/A;   FEMORAL ARTERY STENT Right 2006; 07/05/2011; 05/14/2013   FEMORAL ARTERY STENT Right 05/14/13   between previous stens   HOT HEMOSTASIS N/A 08/29/2017   Procedure: HOT HEMOSTASIS (ARGON PLASMA COAGULATION/BICAP);  Surgeon: Ronald Lobo, MD;  Location: [facility];  Service: Endoscopy;  Laterality: N/A;   LOWER EXTREMITY ANGIOGRAM N/A 07/05/2011   Procedure: LOWER EXTREMITY ANGIOGRAM;  Surgeon: Lorretta Harp, MD;  Location: [facility] CATH LAB;  Service: Cardiovascular;  Laterality: N/A;   LOWER EXTREMITY ANGIOGRAM N/A 05/14/2013   Procedure: LOWER EXTREMITY  ANGIOGRAM;  Surgeon: Lorretta Harp, MD;  Location: [facility] CATH LAB;  Service: Cardiovascular;  Laterality: N/A;   [place];; 1982   PERCUTANEOUS STENT INTERVENTION Right 07/05/2011   Procedure: PERCUTANEOUS STENT INTERVENTION;  Surgeon: Lorretta Harp, MD;  Location: [facility] CATH LAB;  Service: Cardiovascular;  Laterality: Right;   PERIPHERAL VASCULAR CATHETERIZATION N/A 11/04/2014   Procedure: Lower Extremity Angiography;  Surgeon: Lorretta Harp, MD;  Location: [place] CV LAB;  Service: Cardiovascular;  Laterality: N/A;   PERIPHERAL VASCULAR CATHETERIZATION Right 11/04/2014   Procedure: Peripheral Vascular Intervention;  Surgeon: Lorretta Harp, MD;  Location: [place] CV LAB;  Service: Cardiovascular;  Laterality: Right;  SFA   SHOULDER OPEN ROTATOR CUFF REPAIR Left ~ 2004     Family History  Problem Relation Age of Onset   Cancer Mother 40   Heart attack Father        died 95   Parkinson's disease Sister    Heart attack Maternal Grandmother    Heart attack Maternal Grandfather 87       died at 53   Stroke Paternal Grandfather    Heart disease Sister    Deep vein thrombosis Daughter      Social History   Socioeconomic History   Marital status: Married    Spouse name: Not on file   Number of children: Not on file   Years of education: Not on file   Highest education level: Not on file  Occupational History   Not on file  Tobacco Use   Smoking status: Every Day    Packs/day: 1.00    Years: 50.00    Additional pack years: 0.00    Total pack years: 50.00    Types: Cigarettes, E-cigarettes    Last attempt to quit: 07/14/2010    Years since quitting: 11.8   Smokeless tobacco: Never   Tobacco comments:    Resumed smoking 8 months ago, smokes approximately 7 cigarettes daily.. Counseling given    02/09/2022 Patient smokes 3 daily  Substance and Sexual Activity   Alcohol use: Yes    Alcohol/week: 3.0 standard drinks of alcohol    Types: 3 Glasses of  wine per week    Comment: 05/14/2013  "socially; 2-3 glasses of wine twice/wk"   Drug use: No   Sexual activity: Not Currently  Other Topics Concern   Not on file  Social History Narrative   Not on file   Social Determinants of Health   Financial Resource Strain: Not on file  Food Insecurity: Not on file  Transportation Needs: Not on file  Physical Activity: Not on file  Stress: Not on file  Social Connections: Not on file  Intimate Partner Violence: Not on file     BP 124/60   Pulse 89   Ht 5\' 7"  (1.702 m)  Wt 149 lb 12.8 oz (67.9 kg)   SpO2 94%   BMI 23.46 kg/m   Physical Exam:  stable appearing NAD HEENT: Unremarkable Neck:  6 cm JVD, no thyromegally Lymphatics:  No adenopathy Back:  No CVA tenderness Lungs:  Clear with decreased breath sounds HEART:  IRegular rate rhythm, no murmurs, no rubs, no clicks Abd:  soft, positive bowel sounds, no organomegally, no rebound, no guarding Ext:  2 plus pulses, no edema, no cyanosis, no clubbing Skin:  No rashes no nodules Neuro:  CN II through XII intact, motor grossly intact  EKG - NSR with multiple different PAC's  Assess/Plan: AT/PAC's - the patient has evidence of an atrial myopathy. However. There is no good medication to prevent her arrhythmias and she is minimally symptomatic. With a multitude of different PAC's, ablation is not an option. A low dose of a beta blocker or a calcium channel blocker could be considered.  CAD - she is asymptomatic. She has a remote occluded vessel. Tobacco abuse - she is encouraged to stop smoking.  Carleene Overlie Yahsir Wickens,MD

## 2022-05-15 NOTE — Patient Instructions (Addendum)
Medication Instructions:  Your physician recommends that you continue on your current medications as directed. Please refer to the Current Medication list given to you today.  *If you need a refill on your cardiac medications before your next appointment, please call your pharmacy*  Lab Work: None ordered.  If you have labs (blood work) drawn today and your tests are completely normal, you will receive your results only by: MyChart Message (if you have MyChart) OR A paper copy in the mail If you have any lab test that is abnormal or we need to change your treatment, we will call you to review the results.  Testing/Procedures: None ordered.  Follow-Up: At [facility], you and your health needs are our priority.  As part of our continuing mission to provide you with exceptional heart care, we have created designated Provider Care Teams.  These Care Teams include your primary Cardiologist (physician) and Advanced Practice Providers (APPs -  Physician Assistants and Nurse Practitioners) who all work together to provide you with the care you need, when you need it.  We recommend signing up for the patient portal called "MyChart".  Sign up information is provided on this After Visit Summary.  MyChart is used to connect with patients for Virtual Visits (Telemedicine).  Patients are able to view lab/test results, encounter notes, upcoming appointments, etc.  Non-urgent messages can be sent to your provider as well.   To learn more about what you can do with MyChart, go to [email or web address].    Your next appointment:   AS NEEDED  The format for your next appointment:   In Person  Provider:   Gregg Taylor, MD{or one of the following Advanced Practice Providers on your designated Care Team:   Renee Ursuy, PA-C Michael "Andy" Tillery, PA-C          

## 2022-05-25 DIAGNOSIS — M79671 Pain in right foot: Secondary | ICD-10-CM | POA: Diagnosis not present

## 2022-06-04 ENCOUNTER — Other Ambulatory Visit: Payer: Self-pay | Admitting: Cardiovascular Disease

## 2022-06-12 DIAGNOSIS — N390 Urinary tract infection, site not specified: Secondary | ICD-10-CM | POA: Diagnosis not present

## 2022-06-20 DIAGNOSIS — R109 Unspecified abdominal pain: Secondary | ICD-10-CM | POA: Diagnosis not present

## 2022-06-20 DIAGNOSIS — R198 Other specified symptoms and signs involving the digestive system and abdomen: Secondary | ICD-10-CM | POA: Diagnosis not present

## 2022-06-20 DIAGNOSIS — R194 Change in bowel habit: Secondary | ICD-10-CM | POA: Diagnosis not present

## 2022-07-17 DIAGNOSIS — M25561 Pain in right knee: Secondary | ICD-10-CM | POA: Diagnosis not present

## 2022-07-19 ENCOUNTER — Encounter (INDEPENDENT_AMBULATORY_CARE_PROVIDER_SITE_OTHER): Payer: Medicare PPO | Admitting: Ophthalmology

## 2022-07-20 ENCOUNTER — Encounter (INDEPENDENT_AMBULATORY_CARE_PROVIDER_SITE_OTHER): Payer: Medicare PPO | Admitting: Ophthalmology

## 2022-07-20 DIAGNOSIS — I471 Supraventricular tachycardia, unspecified: Secondary | ICD-10-CM | POA: Diagnosis not present

## 2022-07-20 DIAGNOSIS — Z6824 Body mass index (BMI) 24.0-24.9, adult: Secondary | ICD-10-CM | POA: Diagnosis not present

## 2022-07-20 DIAGNOSIS — Z1389 Encounter for screening for other disorder: Secondary | ICD-10-CM | POA: Diagnosis not present

## 2022-07-20 DIAGNOSIS — E7800 Pure hypercholesterolemia, unspecified: Secondary | ICD-10-CM | POA: Diagnosis not present

## 2022-07-20 DIAGNOSIS — I10 Essential (primary) hypertension: Secondary | ICD-10-CM | POA: Diagnosis not present

## 2022-07-20 DIAGNOSIS — Z720 Tobacco use: Secondary | ICD-10-CM | POA: Diagnosis not present

## 2022-07-20 DIAGNOSIS — M859 Disorder of bone density and structure, unspecified: Secondary | ICD-10-CM | POA: Diagnosis not present

## 2022-07-20 DIAGNOSIS — Z0000 Encounter for general adult medical examination without abnormal findings: Secondary | ICD-10-CM | POA: Diagnosis not present

## 2022-07-24 ENCOUNTER — Encounter (INDEPENDENT_AMBULATORY_CARE_PROVIDER_SITE_OTHER): Payer: Medicare PPO | Admitting: Ophthalmology

## 2022-07-24 DIAGNOSIS — H348322 Tributary (branch) retinal vein occlusion, left eye, stable: Secondary | ICD-10-CM | POA: Diagnosis not present

## 2022-07-24 DIAGNOSIS — H35033 Hypertensive retinopathy, bilateral: Secondary | ICD-10-CM | POA: Diagnosis not present

## 2022-07-24 DIAGNOSIS — H43813 Vitreous degeneration, bilateral: Secondary | ICD-10-CM | POA: Diagnosis not present

## 2022-07-24 DIAGNOSIS — I10 Essential (primary) hypertension: Secondary | ICD-10-CM | POA: Diagnosis not present

## 2022-08-08 DIAGNOSIS — M79671 Pain in right foot: Secondary | ICD-10-CM | POA: Diagnosis not present

## 2022-08-15 DIAGNOSIS — M79671 Pain in right foot: Secondary | ICD-10-CM | POA: Diagnosis not present

## 2022-08-26 DIAGNOSIS — N390 Urinary tract infection, site not specified: Secondary | ICD-10-CM | POA: Diagnosis not present

## 2022-09-20 DIAGNOSIS — N3000 Acute cystitis without hematuria: Secondary | ICD-10-CM | POA: Diagnosis not present

## 2022-09-20 DIAGNOSIS — M549 Dorsalgia, unspecified: Secondary | ICD-10-CM | POA: Diagnosis not present

## 2022-09-20 DIAGNOSIS — M5459 Other low back pain: Secondary | ICD-10-CM | POA: Diagnosis not present

## 2022-09-23 DIAGNOSIS — Z87440 Personal history of urinary (tract) infections: Secondary | ICD-10-CM | POA: Diagnosis not present

## 2022-09-23 DIAGNOSIS — M47816 Spondylosis without myelopathy or radiculopathy, lumbar region: Secondary | ICD-10-CM | POA: Diagnosis not present

## 2022-09-23 DIAGNOSIS — R110 Nausea: Secondary | ICD-10-CM | POA: Diagnosis not present

## 2022-09-23 DIAGNOSIS — M549 Dorsalgia, unspecified: Secondary | ICD-10-CM | POA: Diagnosis not present

## 2022-09-23 DIAGNOSIS — I714 Abdominal aortic aneurysm, without rupture, unspecified: Secondary | ICD-10-CM | POA: Diagnosis not present

## 2022-10-04 ENCOUNTER — Other Ambulatory Visit: Payer: Self-pay | Admitting: Family Medicine

## 2022-10-04 DIAGNOSIS — Z1231 Encounter for screening mammogram for malignant neoplasm of breast: Secondary | ICD-10-CM

## 2022-10-19 ENCOUNTER — Encounter (INDEPENDENT_AMBULATORY_CARE_PROVIDER_SITE_OTHER): Payer: Medicare PPO | Admitting: Ophthalmology

## 2022-10-19 DIAGNOSIS — H35033 Hypertensive retinopathy, bilateral: Secondary | ICD-10-CM

## 2022-10-19 DIAGNOSIS — H43813 Vitreous degeneration, bilateral: Secondary | ICD-10-CM | POA: Diagnosis not present

## 2022-10-19 DIAGNOSIS — H348322 Tributary (branch) retinal vein occlusion, left eye, stable: Secondary | ICD-10-CM

## 2022-10-19 DIAGNOSIS — I10 Essential (primary) hypertension: Secondary | ICD-10-CM

## 2022-10-24 DIAGNOSIS — R319 Hematuria, unspecified: Secondary | ICD-10-CM | POA: Diagnosis not present

## 2022-10-24 DIAGNOSIS — R350 Frequency of micturition: Secondary | ICD-10-CM | POA: Diagnosis not present

## 2022-10-24 DIAGNOSIS — I10 Essential (primary) hypertension: Secondary | ICD-10-CM | POA: Diagnosis not present

## 2022-10-24 DIAGNOSIS — N390 Urinary tract infection, site not specified: Secondary | ICD-10-CM | POA: Diagnosis not present

## 2022-10-24 DIAGNOSIS — N3000 Acute cystitis without hematuria: Secondary | ICD-10-CM | POA: Diagnosis not present

## 2022-11-09 ENCOUNTER — Ambulatory Visit: Payer: Medicare PPO | Admitting: Neurology

## 2022-11-09 ENCOUNTER — Telehealth: Payer: Self-pay | Admitting: Neurology

## 2022-11-09 ENCOUNTER — Encounter: Payer: Self-pay | Admitting: Neurology

## 2022-11-09 VITALS — BP 111/64 | HR 78 | Ht 66.0 in | Wt 148.0 lb

## 2022-11-09 DIAGNOSIS — R269 Unspecified abnormalities of gait and mobility: Secondary | ICD-10-CM | POA: Insufficient documentation

## 2022-11-09 DIAGNOSIS — G8929 Other chronic pain: Secondary | ICD-10-CM | POA: Diagnosis not present

## 2022-11-09 DIAGNOSIS — R202 Paresthesia of skin: Secondary | ICD-10-CM | POA: Insufficient documentation

## 2022-11-09 DIAGNOSIS — N390 Urinary tract infection, site not specified: Secondary | ICD-10-CM | POA: Diagnosis not present

## 2022-11-09 DIAGNOSIS — M5440 Lumbago with sciatica, unspecified side: Secondary | ICD-10-CM

## 2022-11-09 MED ORDER — GABAPENTIN 100 MG PO CAPS: 300.0000 mg | ORAL_CAPSULE | Freq: Three times a day (TID) | ORAL | 11 refills | Status: DC

## 2022-11-09 NOTE — Progress Notes (Signed)
Chief Complaint  Patient presents with   New Patient (Initial Visit)    Pt in 14  Pt here for Polyneuropathy Pt states burning,tingling, numbness in both feet,toes and lower legs Pt states pain in lower back pt states numbness in fingers        ASSESSMENT AND PLAN  Sara Dominguez is a 82 y.o. female   Slow onset gait abnormality, lower extremity ascending paresthesia, Worsening urinary urgency, frequency, Long history of osteopenia, arthritis, Chronic low back pain radiating pain to bilateral lower extremity, history of lumbar decompression surgery in the past  Length-dependent sensory change, distal weakness, hyperreflexia of bilateral upper extremity and patella  Differentiation diagnosis include cervical spondylitic myelopathy with superimposed lumbar radiculopathy, peripheral neuropathy  MRI of cervical spine, lumbar spine,  EMG nerve conduction study  Higher dose of gabapentin as needed, up to 100 mg 3 tablets 3 times a day  DIAGNOSTIC DATA (LABS, IMAGING, TESTING) - I reviewed patient records, labs, notes, testing and imaging myself where available.   MEDICAL HISTORY:  Sara Dominguez is a 82 year old female, seen in request by sports medicine Dr. Penni Bombard, [place] for evaluation of worsening gait abnormality, her primary care is from [facility] Dr. [name], initial evaluation was on November 09, 2022  I reviewed and summarized the referring note.PMHX. CAD Peripheral vascular disease Smoker  HLD HTN GERD Osteopenia Left shoulder replacement Lumbar decompression surgery She has long history of osteopenia, multiple joints pain, low back pain, radiating to bilateral lower extremity and hip,  Since 2023, she noticed gradual onset numbness tingling at the bottom of her feet, ascending to distal leg level now, also noticed unsteady gait, has to be careful when walking  She has a diet related low back injury more than 40 years ago, required decompression surgery  then.  She also complains of worsening urinary frequency, urgency, recent intermittent finger paresthesia, she had a history of left shoulder replacement, denies significant neck pain   PHYSICAL EXAM:   Vitals:   11/09/22 0847  BP: 111/64  Pulse: 78  Weight: 148 lb (67.1 kg)  Height: 5\' 6"  (1.676 m)   Not recorded     Body mass index is 23.89 kg/m.  PHYSICAL EXAMNIATION:  Gen: NAD, conversant, well nourised, well groomed                     Cardiovascular: Regular rate rhythm, no peripheral edema, warm, nontender. Eyes: Conjunctivae clear without exudates or hemorrhage Neck: Supple, no carotid bruits. Pulmonary: Clear to auscultation bilaterally   NEUROLOGICAL EXAM:  MENTAL STATUS: Speech/cognition: Awake, alert, oriented to history taking and casual conversation CRANIAL NERVES: CN II: Visual fields are full to confrontation. Pupils are round equal and briskly reactive to light. CN III, IV, VI: extraocular movement are normal. No ptosis. CN V: Facial sensation is intact to light touch CN VII: Face is symmetric with normal eye closure  CN VIII: Hearing is normal to causal conversation. CN IX, X: Phonation is normal. CN XI: Head turning and shoulder shrug are intact  MOTOR: Right more than left hammertoes, mild bilateral toe extension, flexion weakness,  REFLEXES: Reflexes are 2+ and symmetric at the biceps, triceps, 3/3 knees, and absent at ankles. Plantar responses are mute bilaterally  SENSORY: Length-dependent decreased to light touch, pinprick, vibratory sensation to distal shin level  COORDINATION: There is no trunk or limb dysmetria noted.  GAIT/STANCE: Push-up from seated position, stiff, cautious, could not stand up on tiptoe and heels, negative  Romberg sign  REVIEW OF SYSTEMS:  Full 14 system review of systems performed and notable only for as above All other review of systems were negative.   ALLERGIES: No Known Allergies  HOME  MEDICATIONS: Current Outpatient Medications  Medication Sig Dispense Refill   acetaminophen (TYLENOL) 500 MG tablet Take 500-1,000 mg by mouth every 6 (six) hours as needed for mild pain or headache.     alendronate (FOSAMAX) 70 MG tablet Take 70 mg by mouth every Wednesday. Take with a full glass of water on an empty stomach.     atorvastatin (LIPITOR) 80 MG tablet TAKE 1 TABLET EVERY DAY 90 tablet 10   Cholecalciferol (VITAMIN D-3) 25 MCG (1000 UT) CAPS Take 2,000 Units by mouth daily.     clopidogrel (PLAVIX) 75 MG tablet TAKE 1 TABLET EVERY DAY 90 tablet 3   cyanocobalamin (VITAMIN B12) 1000 MCG tablet Take 1,000 mcg by mouth daily.     ferrous sulfate 325 (65 FE) MG tablet Take 325 mg by mouth every Wednesday.      gabapentin (NEURONTIN) 100 MG capsule Take 100 mg by mouth daily in the afternoon.     hydrochlorothiazide (HYDRODIURIL) 12.5 MG tablet Take 12.5 mg by mouth daily.     losartan (COZAAR) 100 MG tablet Take 1 tablet (100 mg total) by mouth daily. 30 tablet 0   MAGNESIUM PO Take 1 tablet by mouth daily in the afternoon.     Omega-3 Fatty Acids (FISH OIL) 1000 MG CAPS Take 1,000 mg by mouth daily.      pantoprazole (PROTONIX) 40 MG tablet Take 40 mg by mouth daily.     vitamin B-12 (CYANOCOBALAMIN) 250 MCG tablet Take 250 mcg by mouth daily.     hydrALAZINE (APRESOLINE) 10 MG tablet Take 10 mg by mouth 2 (two) times daily.     No current facility-administered medications for this visit.    PAST MEDICAL HISTORY: Past Medical History:  Diagnosis Date   AAA (abdominal aortic aneurysm), small, history of 2.3 X 2.4 07/06/2011   Abnormal ankle brachial index 07/06/2011   Anxiety    CAD (coronary artery disease), with LAD stent in 2006, chronically occluded OM branch with hx. of Stent to RCA  07/06/2011   Claudication in peripheral vascular disease, life style limiting 07/06/2011   Hyperlipidemia LDL goal < 70 09/08/2012   Hypertension    PAD (peripheral artery disease) (HCC)      PAST SURGICAL HISTORY: Past Surgical History:  Procedure Laterality Date   ABDOMINAL AORTAGRAM N/A 07/05/2011   Procedure: ABDOMINAL Ronny Flurry;  Surgeon: Runell Gess, MD;  Location: [facility] CATH LAB;  Service: Cardiovascular;  Laterality: N/A;   ABDOMINAL HYSTERECTOMY  1992   CATARACT EXTRACTION W/ INTRAOCULAR LENS  IMPLANT, BILATERAL Bilateral ~ 2006   CORONARY ANGIOPLASTY WITH STENT PLACEMENT  2006; 2009; 2013   "1 + 1 + 2";  total of 4"   ENTEROSCOPY N/A 08/29/2017   Procedure: ENTEROSCOPY;  Surgeon: Bernette Redbird, MD;  Location: [facility] ENDOSCOPY;  Service: Endoscopy;  Laterality: N/A;   ESOPHAGOGASTRODUODENOSCOPY (EGD) WITH PROPOFOL N/A 06/07/2016   Procedure: ESOPHAGOGASTRODUODENOSCOPY (EGD) WITH PROPOFOL;  Surgeon: Bernette Redbird, MD;  Location: [facility] ENDOSCOPY;  Service: Endoscopy;  Laterality: N/A;   FEMORAL ARTERY STENT Right 2006; 07/05/2011; 05/14/2013   FEMORAL ARTERY STENT Right 05/14/13   between previous stens   HOT HEMOSTASIS N/A 08/29/2017   Procedure: HOT HEMOSTASIS (ARGON PLASMA COAGULATION/BICAP);  Surgeon: Bernette Redbird, MD;  Location: [facility] ENDOSCOPY;  Service: Endoscopy;  Laterality:  N/A;   LOWER EXTREMITY ANGIOGRAM N/A 07/05/2011   Procedure: LOWER EXTREMITY ANGIOGRAM;  Surgeon: Runell Gess, MD;  Location: [facility] CATH LAB;  Service: Cardiovascular;  Laterality: N/A;   LOWER EXTREMITY ANGIOGRAM N/A 05/14/2013   Procedure: LOWER EXTREMITY ANGIOGRAM;  Surgeon: Runell Gess, MD;  Location: [facility] CATH LAB;  Service: Cardiovascular;  Laterality: N/A;   LUMBAR DISC SURGERY  1980;; 1982   PERCUTANEOUS STENT INTERVENTION Right 07/05/2011   Procedure: PERCUTANEOUS STENT INTERVENTION;  Surgeon: Runell Gess, MD;  Location: [facility] CATH LAB;  Service: Cardiovascular;  Laterality: Right;   PERIPHERAL VASCULAR CATHETERIZATION N/A 11/04/2014   Procedure: Lower Extremity Angiography;  Surgeon: Runell Gess, MD;  Location: [facility] INVASIVE CV LAB;  Service: Cardiovascular;  Laterality: N/A;    PERIPHERAL VASCULAR CATHETERIZATION Right 11/04/2014   Procedure: Peripheral Vascular Intervention;  Surgeon: Runell Gess, MD;  Location: [facility] INVASIVE CV LAB;  Service: Cardiovascular;  Laterality: Right;  SFA   SHOULDER OPEN ROTATOR CUFF REPAIR Left ~ 2004    FAMILY HISTORY: Family History  Problem Relation Age of Onset   Cancer Mother 25   Heart attack Father        died 39   Parkinson's disease Sister    Heart disease Sister    Neuropathy Sister    Heart attack Maternal Grandmother    Heart attack Maternal Grandfather 50       died at 87   Stroke Paternal Grandfather    Deep vein thrombosis Daughter     SOCIAL HISTORY: Social History   Socioeconomic History   Marital status: Married    Spouse name: Not on file   Number of children: Not on file   Years of education: Not on file   Highest education level: Not on file  Occupational History   Not on file  Tobacco Use   Smoking status: Every Day    Current packs/day: 0.00    Average packs/day: 1 pack/day for 50.0 years (50.0 ttl pk-yrs)    Types: Cigarettes    Start date: 07/13/1960    Last attempt to quit: 07/14/2010    Years since quitting: 12.3   Smokeless tobacco: Never   Tobacco comments:    Resumed smoking 8 months ago, smokes approximately 7 cigarettes daily.. Counseling given    02/09/2022 Patient smokes 3 daily  Substance and Sexual Activity   Alcohol use: Yes    Alcohol/week: 3.0 standard drinks of alcohol    Types: 3 Glasses of wine per week    Comment: social   Drug use: No   Sexual activity: Not Currently  Other Topics Concern   Not on file  Social History Narrative   Not on file   Social Determinants of Health   Financial Resource Strain: Low Risk  (10/13/2021)   Received from [facility], [facility]   Overall Financial Resource Strain (CARDIA)    Difficulty of Paying Living Expenses: Not hard at all  Food Insecurity: No Food Insecurity (10/13/2021)   Received from [facility], [facility]   Hunger Vital Sign    Worried About Running Out of Food in the Last Year: Never true    Ran Out of Food in the Last Year: Never true  Transportation Needs: No Transportation Needs (10/13/2021)   Received from [facility], [facility]   [facility] - Transportation    Lack of Transportation (Medical): No    Lack of Transportation (Non-Medical): No  Physical Activity: Inactive (10/13/2021)   [facility]   Exercise Vital Sign    Days of Exercise per Week: 0 days    Minutes of Exercise per Session: 0 min  Stress: No Stress Concern Present (10/13/2021)   Received from [facility], [facility] of [facility] - Occupational Stress Questionnaire    Feeling of Stress : Not at all  Social Connections: Moderately Isolated (10/13/2021)   Received from [facility], [facility]   Social Connection and Isolation Panel [NHANES]    Frequency of Communication with Friends and Family: Once a week    Frequency of Social Gatherings with Friends and Family: Once a week    Attends Religious Services: More than 4 times per year    Active Member of [employer] or Organizations: No    Attends Banker Meetings: Never    Marital Status: Married  Catering manager Violence: Not At Risk (10/13/2021)   Received from [facility], [facility]   Humiliation, Afraid, Rape, and Kick questionnaire    Fear of Current or Ex-Partner: No    Emotionally Abused: No    Physically Abused: No    Sexually Abused: No      Levert Feinstein, M.D. Ph.D.  [facility] [address] [place], [place] [address] Ph: [phone] Fax: [phone]  CC:  Delfin Gant, MD [address] [place],  [place] [address]  [name], MD

## 2022-11-09 NOTE — Telephone Encounter (Signed)
[employer] [id number] exp. 11/09/22-01/08/23 sent to GI [phone]

## 2022-11-15 DIAGNOSIS — R300 Dysuria: Secondary | ICD-10-CM | POA: Diagnosis not present

## 2022-11-15 DIAGNOSIS — N3000 Acute cystitis without hematuria: Secondary | ICD-10-CM | POA: Diagnosis not present

## 2022-11-20 ENCOUNTER — Ambulatory Visit
Admission: RE | Admit: 2022-11-20 | Discharge: 2022-11-20 | Disposition: A | Discharge status: Home / Self Care | Payer: Medicare PPO | Source: Ambulatory Visit | Attending: Family Medicine | Admitting: Family Medicine

## 2022-11-20 DIAGNOSIS — Z1231 Encounter for screening mammogram for malignant neoplasm of breast: Secondary | ICD-10-CM | POA: Diagnosis not present

## 2022-11-29 DIAGNOSIS — N390 Urinary tract infection, site not specified: Secondary | ICD-10-CM | POA: Diagnosis not present

## 2022-12-08 ENCOUNTER — Ambulatory Visit
Admission: RE | Admit: 2022-12-08 | Discharge: 2022-12-08 | Disposition: A | Discharge status: Home / Self Care | Payer: Medicare PPO | Source: Ambulatory Visit | Attending: Neurology | Admitting: Neurology

## 2022-12-08 DIAGNOSIS — R269 Unspecified abnormalities of gait and mobility: Secondary | ICD-10-CM

## 2022-12-08 DIAGNOSIS — G8929 Other chronic pain: Secondary | ICD-10-CM

## 2022-12-08 DIAGNOSIS — M5440 Lumbago with sciatica, unspecified side: Secondary | ICD-10-CM | POA: Diagnosis not present

## 2022-12-08 DIAGNOSIS — R202 Paresthesia of skin: Secondary | ICD-10-CM

## 2022-12-11 ENCOUNTER — Telehealth: Payer: Self-pay | Admitting: Neurology

## 2022-12-11 NOTE — Telephone Encounter (Signed)
Call patient MRI of lumbar spine showed multilevel degenerative changes, most noticeable at L3-4, L4-5, with variable degree of foraminal narrowing, MRI of cervical spine also showed multilevel degenerative changes but there is no evidence of spinal cord or nerve root compression  I will review MRIs in detail at her EMG nerve conduction study visit in November   IMPRESSION: This MRI of the lumbar spine without contrast shows the following: At L2-L3, there is mild spinal stenosis due to minimal retrolisthesis and other degenerative changes.  This also causes moderate left lateral recess stenosis and mild to moderate right lateral recess stenosis though there does not appear to be nerve root compression. At L3-L4, there is moderate spinal stenosis due to degenerative changes that also causes mild to moderate right lateral recess stenosis and mild foraminal narrowing.  There does not appear to be nerve root compression. At L4-L5, there is moderately severe spinal stenosis due to degenerative changes that also causes moderate right foraminal narrowing and moderate bilateral lateral recess stenosis.  There does not appear to be nerve root compression. At L5-S1, there are degenerative changes.  There is no spinal stenosis though there is moderate right lateral recess stenosis.  There does not appear to be nerve root compression.     IMPRESSION: This MRI of the cervical spine without contrast shows the following: The spinal cord appears normal. At C4-C5, there are degenerative changes causing mild spinal stenosis and moderate foraminal narrowing.  There does not appear to be nerve root compression. At C5-C6, there are degenerative changes causing mild spinal stenosis and mild to moderate right greater than left foraminal narrowing but no nerve root compression. At C6-C7, there are degenerative changes causing mild spinal stenosis and mild foraminal narrowing but no nerve root compression.

## 2022-12-18 DIAGNOSIS — G629 Polyneuropathy, unspecified: Secondary | ICD-10-CM | POA: Diagnosis not present

## 2022-12-18 DIAGNOSIS — H9193 Unspecified hearing loss, bilateral: Secondary | ICD-10-CM | POA: Diagnosis not present

## 2022-12-18 DIAGNOSIS — K219 Gastro-esophageal reflux disease without esophagitis: Secondary | ICD-10-CM | POA: Diagnosis not present

## 2022-12-18 DIAGNOSIS — Z91199 Patient's noncompliance with other medical treatment and regimen due to unspecified reason: Secondary | ICD-10-CM | POA: Diagnosis not present

## 2022-12-18 DIAGNOSIS — I2510 Atherosclerotic heart disease of native coronary artery without angina pectoris: Secondary | ICD-10-CM | POA: Diagnosis not present

## 2022-12-18 DIAGNOSIS — H348392 Tributary (branch) retinal vein occlusion, unspecified eye, stable: Secondary | ICD-10-CM | POA: Diagnosis not present

## 2022-12-18 DIAGNOSIS — Z720 Tobacco use: Secondary | ICD-10-CM | POA: Diagnosis not present

## 2022-12-18 DIAGNOSIS — Z7902 Long term (current) use of antithrombotics/antiplatelets: Secondary | ICD-10-CM | POA: Diagnosis not present

## 2022-12-18 DIAGNOSIS — Z809 Family history of malignant neoplasm, unspecified: Secondary | ICD-10-CM | POA: Diagnosis not present

## 2022-12-18 DIAGNOSIS — I471 Supraventricular tachycardia, unspecified: Secondary | ICD-10-CM | POA: Diagnosis not present

## 2022-12-18 DIAGNOSIS — M1990 Unspecified osteoarthritis, unspecified site: Secondary | ICD-10-CM | POA: Diagnosis not present

## 2022-12-18 DIAGNOSIS — R269 Unspecified abnormalities of gait and mobility: Secondary | ICD-10-CM | POA: Diagnosis not present

## 2022-12-18 DIAGNOSIS — Z823 Family history of stroke: Secondary | ICD-10-CM | POA: Diagnosis not present

## 2022-12-18 DIAGNOSIS — M8580 Other specified disorders of bone density and structure, unspecified site: Secondary | ICD-10-CM | POA: Diagnosis not present

## 2022-12-18 DIAGNOSIS — Z7983 Long term (current) use of bisphosphonates: Secondary | ICD-10-CM | POA: Diagnosis not present

## 2022-12-18 DIAGNOSIS — I719 Aortic aneurysm of unspecified site, without rupture: Secondary | ICD-10-CM | POA: Diagnosis not present

## 2022-12-18 DIAGNOSIS — I10 Essential (primary) hypertension: Secondary | ICD-10-CM | POA: Diagnosis not present

## 2022-12-18 DIAGNOSIS — E785 Hyperlipidemia, unspecified: Secondary | ICD-10-CM | POA: Diagnosis not present

## 2022-12-18 DIAGNOSIS — R32 Unspecified urinary incontinence: Secondary | ICD-10-CM | POA: Diagnosis not present

## 2022-12-18 DIAGNOSIS — I252 Old myocardial infarction: Secondary | ICD-10-CM | POA: Diagnosis not present

## 2022-12-27 DIAGNOSIS — R399 Unspecified symptoms and signs involving the genitourinary system: Secondary | ICD-10-CM | POA: Diagnosis not present

## 2022-12-27 DIAGNOSIS — I499 Cardiac arrhythmia, unspecified: Secondary | ICD-10-CM | POA: Diagnosis not present

## 2022-12-28 ENCOUNTER — Ambulatory Visit (INDEPENDENT_AMBULATORY_CARE_PROVIDER_SITE_OTHER): Payer: Medicare PPO | Admitting: Neurology

## 2022-12-28 ENCOUNTER — Ambulatory Visit: Payer: Self-pay | Admitting: Neurology

## 2022-12-28 VITALS — BP 136/72 | HR 66 | Ht 66.0 in | Wt 148.0 lb

## 2022-12-28 DIAGNOSIS — R269 Unspecified abnormalities of gait and mobility: Secondary | ICD-10-CM

## 2022-12-28 DIAGNOSIS — G8929 Other chronic pain: Secondary | ICD-10-CM

## 2022-12-28 DIAGNOSIS — M5440 Lumbago with sciatica, unspecified side: Secondary | ICD-10-CM | POA: Diagnosis not present

## 2022-12-28 DIAGNOSIS — R202 Paresthesia of skin: Secondary | ICD-10-CM | POA: Diagnosis not present

## 2022-12-28 DIAGNOSIS — R2689 Other abnormalities of gait and mobility: Secondary | ICD-10-CM

## 2022-12-28 DIAGNOSIS — Z0289 Encounter for other administrative examinations: Secondary | ICD-10-CM

## 2022-12-28 MED ORDER — DULOXETINE HCL 30 MG PO CPEP: 30.0000 mg | ORAL_CAPSULE | Freq: Every day | ORAL | 5 refills | Status: DC
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
## 2022-12-28 NOTE — Procedures (Signed)
Full Name: Jaylean Vadnais Gender: Female MRN #: [id number] Date of Birth: 07/10/1940    Visit Date: 12/28/2022 07:36 Age: 82 Years Examining Physician: Dr. Levert Feinstein Referring Physician: Dr. Levert Feinstein Height: 5 feet 6 inch History: 82 year old female complains of worsening low back pain, lower extremity paresthesia, gait abnormality  Summary of the test: Nerve conduction study: Bilateral sural, superficial peroneal sensory responses were within normal limit.  Bilateral peroneal to EDB motor responses were normal.  Bilateral tibial motor responses showed mild to moderately decreased CMAP amplitude, with normal distal latency,  Bilateral H-reflexes were attempted, well-preserved on the left side, but not on the right  Electromyography: Selected needle examinations of bilateral lower extremity muscles and bilateral lumbosacral paraspinal muscles were performed.  There is electrodiagnostic evidence of chronic neuropathic changes involving bilateral bilateral L4, 5 myotomes.  There is no evidence of active denervation's involving bilateral lumbosacral paraspinal muscles.  Conclusion:  This is an abnormal study.  There is electrodiagnostic evidence of chronic bilateral lumbosacral radiculopathy involving bilateral L4-5 myotomes.  There is no evidence of large fiber peripheral neuropathy.   ------------------------------- Levert Feinstein, M.D.PH.D.  [facility] [address], [address] [place], [place] [address] Tel: [phone] Fax: [phone]  Verbal informed consent was obtained from the patient, patient was informed of potential risk of procedure, including bruising, bleeding, hematoma formation, infection, muscle weakness, muscle pain, numbness, among others.        MNC    Nerve / Sites Muscle Latency Ref. Amplitude Ref. Rel Amp Segments Distance Velocity Ref. Area    ms ms mV mV %  cm m/s m/s mVms  L Peroneal - EDB     Ankle EDB 5.2 <=6.5 5.9  >=2.0 100 Ankle - EDB 9   27.4     Fib head EDB 11.8  5.1  85.7 Fib head - Ankle 26.6 40 >=44 22.4     Pop fossa EDB 14.4  5.7  111 Pop fossa - Fib head 11 43 >=44 26.1         Pop fossa - Ankle      R Peroneal - EDB     Ankle EDB 5.3 <=6.5 3.8 >=2.0 100 Ankle - EDB 9   19.0     Fib head EDB 11.9  2.8  74.1 Fib head - Ankle 27 41 >=44 14.7     Pop fossa EDB 14.7  3.8  135 Pop fossa - Fib head 12 43 >=44 19.9         Pop fossa - Ankle      L Tibial - AH     Ankle AH 5.5 <=5.8 2.6 >=4.0 100 Ankle - AH 9   12.2     Pop fossa AH 13.5  2.2  84.4 Pop fossa - Ankle 35.6 44 >=41 10.6  R Tibial - AH     Ankle AH 5.8 <=5.8 2.6 >=4.0 100 Ankle - AH 9   10.3     Pop fossa AH 14.5  1.5  58 Pop fossa - Ankle   >=41 7.9             SNC    Nerve / Sites Rec. Site Peak Lat Ref.  Amp Ref. Segments Distance    ms ms V V  cm  L Sural - Ankle (Calf)     Calf Ankle 4.4 <=4.4 6 >=6 Calf - Ankle 14  R Sural - Ankle (Calf)     Calf  Ankle 4.1 <=4.4 5 >=6 Calf - Ankle 14  L Superficial peroneal - Ankle     Lat leg Ankle 3.9 <=4.4 5 >=6 Lat leg - Ankle 14  R Superficial peroneal - Ankle     Lat leg Ankle 4.2 <=4.4 6 >=6 Lat leg - Ankle 14             F  Wave    Nerve F Lat Ref.   ms ms  L Tibial - AH 72.6 <=56.0  R Tibial - AH 49.0 <=56.0         H Reflex    Nerve H Lat Lat Hmax   ms ms   Left Right Ref. Left Right Ref.  Tibial - Soleus 42.6 44.6 <=35.0 25.9 37.7 <=35.0         EMG Summary Table    Spontaneous MUAP Recruitment  Muscle IA Fib PSW Fasc Other Amp Dur. Poly Pattern  R. Tibialis anterior Normal None None None _______ Normal Normal Normal Reduced  R. Tibialis posterior Normal None None None _______ Normal Normal Normal Reduced  R. Peroneus longus Normal None None None _______ Normal Normal Normal Reduced  R. Gastrocnemius (Medial head) Normal None None None _______ Normal Normal Normal Reduced  R. Vastus lateralis Normal None None None _______ Normal Normal Normal Reduced  L.  Vastus lateralis Normal None None None _______ Normal Normal Normal Reduced  L. Gastrocnemius (Medial head) Normal None None None _______ Normal Normal Normal Reduced  L. Tibialis anterior Normal None None None _______ Normal Normal Normal Reduced  L. Tibialis posterior Normal None None None _______ Normal Normal Normal Reduced  L. Peroneus longus Normal None None None _______ Normal Normal Normal Reduced  R. Lumbar paraspinals (low) Normal None None None _______ Normal Normal Normal Normal  R. Lumbar paraspinals (mid) Normal None None None _______ Normal Normal Normal Normal  L. Lumbar paraspinals (low) Normal None None None _______ Normal Normal Normal Normal  L. Lumbar paraspinals (mid) Normal None None None _______ Normal Normal Normal Normal

## 2023-01-01 ENCOUNTER — Ambulatory Visit (HOSPITAL_COMMUNITY)
Admission: RE | Admit: 2023-01-01 | Discharge: 2023-01-01 | Discharge status: Home / Self Care | Payer: Medicare PPO | Source: Ambulatory Visit | Attending: Cardiovascular Disease | Admitting: Cardiovascular Disease

## 2023-01-01 ENCOUNTER — Ambulatory Visit: Payer: Medicare PPO | Attending: Neurology

## 2023-01-01 ENCOUNTER — Ambulatory Visit (HOSPITAL_COMMUNITY)
Admission: RE | Admit: 2023-01-01 | Discharge: 2023-01-01 | Disposition: A | Discharge status: Home / Self Care | Payer: Medicare PPO | Source: Ambulatory Visit | Attending: Cardiology | Admitting: Cardiology

## 2023-01-01 ENCOUNTER — Other Ambulatory Visit: Payer: Self-pay

## 2023-01-01 DIAGNOSIS — R269 Unspecified abnormalities of gait and mobility: Secondary | ICD-10-CM | POA: Insufficient documentation

## 2023-01-01 DIAGNOSIS — M5440 Lumbago with sciatica, unspecified side: Secondary | ICD-10-CM | POA: Diagnosis not present

## 2023-01-01 DIAGNOSIS — G8929 Other chronic pain: Secondary | ICD-10-CM | POA: Diagnosis not present

## 2023-01-01 DIAGNOSIS — R2681 Unsteadiness on feet: Secondary | ICD-10-CM | POA: Insufficient documentation

## 2023-01-01 DIAGNOSIS — R202 Paresthesia of skin: Secondary | ICD-10-CM | POA: Diagnosis not present

## 2023-01-01 DIAGNOSIS — M6281 Muscle weakness (generalized): Secondary | ICD-10-CM | POA: Insufficient documentation

## 2023-01-01 DIAGNOSIS — I739 Peripheral vascular disease, unspecified: Secondary | ICD-10-CM | POA: Diagnosis not present

## 2023-01-01 NOTE — Progress Notes (Signed)
EMG report is under procedure tab. 

## 2023-01-01 NOTE — Therapy (Signed)
OUTPATIENT PHYSICAL THERAPY NEURO EVALUATION   Patient Name: Sara Dominguez MRN: [id number] DOB:1940/04/22, 82 y.o., female Today's Date: 01/01/2023   PCP: [name], MD REFERRING PROVIDER: Levert Feinstein, MD  END OF SESSION:  PT End of Session - 01/01/23 1326     Visit Number 1    Number of Visits 4    Date for PT Re-Evaluation 02/05/23    Authorization Type [employer]    PT Start Time 1325   late arrival   PT Stop Time 1400    PT Time Calculation (min) 35 min             Past Medical History:  Diagnosis Date   AAA (abdominal aortic aneurysm), small, history of 2.3 X 2.4 07/06/2011   Abnormal ankle brachial index 07/06/2011   Anxiety    CAD (coronary artery disease), with LAD stent in 2006, chronically occluded OM branch with hx. of Stent to RCA  07/06/2011   Claudication in peripheral vascular disease, life style limiting 07/06/2011   Hyperlipidemia LDL goal < 70 09/08/2012   Hypertension    PAD (peripheral artery disease) (HCC)    Past Surgical History:  Procedure Laterality Date   ABDOMINAL AORTAGRAM N/A 07/05/2011   Procedure: ABDOMINAL Ronny Flurry;  Surgeon: Runell Gess, MD;  Location: [facility] CATH LAB;  Service: Cardiovascular;  Laterality: N/A;   ABDOMINAL HYSTERECTOMY  1992   CATARACT EXTRACTION W/ INTRAOCULAR LENS  IMPLANT, BILATERAL Bilateral ~ 2006   CORONARY ANGIOPLASTY WITH STENT PLACEMENT  2006; 2009; 2013   "1 + 1 + 2";  total of 4"   ENTEROSCOPY N/A 08/29/2017   Procedure: ENTEROSCOPY;  Surgeon: Bernette Redbird, MD;  Location: [facility] ENDOSCOPY;  Service: Endoscopy;  Laterality: N/A;   ESOPHAGOGASTRODUODENOSCOPY (EGD) WITH PROPOFOL N/A 06/07/2016   Procedure: ESOPHAGOGASTRODUODENOSCOPY (EGD) WITH PROPOFOL;  Surgeon: Bernette Redbird, MD;  Location: [facility] ENDOSCOPY;  Service: Endoscopy;  Laterality: N/A;   FEMORAL ARTERY STENT Right 2006; 07/05/2011; 05/14/2013   FEMORAL ARTERY STENT Right 05/14/13   between previous stens   HOT HEMOSTASIS N/A 08/29/2017    Procedure: HOT HEMOSTASIS (ARGON PLASMA COAGULATION/BICAP);  Surgeon: Bernette Redbird, MD;  Location: [facility];  Service: Endoscopy;  Laterality: N/A;   LOWER EXTREMITY ANGIOGRAM N/A 07/05/2011   Procedure: LOWER EXTREMITY ANGIOGRAM;  Surgeon: Runell Gess, MD;  Location: [facility] CATH LAB;  Service: Cardiovascular;  Laterality: N/A;   LOWER EXTREMITY ANGIOGRAM N/A 05/14/2013   Procedure: LOWER EXTREMITY ANGIOGRAM;  Surgeon: Runell Gess, MD;  Location: [facility] CATH LAB;  Service: Cardiovascular;  Laterality: N/A;   LUMBAR DISC SURGERY  1980;; 1982   PERCUTANEOUS STENT INTERVENTION Right 07/05/2011   Procedure: PERCUTANEOUS STENT INTERVENTION;  Surgeon: Runell Gess, MD;  Location: [facility] CATH LAB;  Service: Cardiovascular;  Laterality: Right;   PERIPHERAL VASCULAR CATHETERIZATION N/A 11/04/2014   Procedure: Lower Extremity Angiography;  Surgeon: Runell Gess, MD;  Location: [facility] INVASIVE CV LAB;  Service: Cardiovascular;  Laterality: N/A;   PERIPHERAL VASCULAR CATHETERIZATION Right 11/04/2014   Procedure: Peripheral Vascular Intervention;  Surgeon: Runell Gess, MD;  Location: [facility] INVASIVE CV LAB;  Service: Cardiovascular;  Laterality: Right;  SFA   SHOULDER OPEN ROTATOR CUFF REPAIR Left ~ 2004   Patient Active Problem List   Diagnosis Date Noted   Gait abnormality 11/09/2022   Paresthesia 11/09/2022   Chronic midline low back pain with sciatica 11/09/2022   PAC (premature atrial contraction) 05/15/2022   SVT (supraventricular tachycardia) (HCC) 05/15/2022   Anemia, iron deficiency 07/03/2017  GI bleed 06/06/2016   Acute blood loss anemia 06/06/2016   Carotid artery disease (HCC) 12/29/2015   Claudication (HCC) 05/14/2013   Essential hypertension 04/28/2013   Hyperlipidemia with target LDL less than 70 09/08/2012   Claudication in peripheral vascular disease, life style limiting 07/06/2011   Abnormal ankle brachial index 07/06/2011   PAD Rt SFA stent 2006 with ISR 5/13. New RSFA stent  05/14/13 07/06/2011   CAD- LAD stent in 2006, chronically occluded OM branch with hx. of Stent to RCA  07/06/2011   AAA (abdominal aortic aneurysm), small, history of 2.3 X 2.4 07/06/2011    ONSET DATE: years, worsening since 2023  REFERRING DIAG: R26.9 (ICD-10-CM) - Gait abnormality R20.2 (ICD-10-CM) - Paresthesia M54.40,G89.29 (ICD-10-CM) - Chronic midline low back pain with sciatica, sciatica laterality unspecified  THERAPY DIAG:  Unsteadiness on feet  Muscle weakness (generalized)  Rationale for Evaluation and Treatment: Rehabilitation  SUBJECTIVE:                                                                                                                                                                                             SUBJECTIVE STATEMENT: 40 years ago had ruptured disc which was surgically removed/repaired.  In past years been experiencing numbness/tingling in toes/bottoms of feet. Reports no sciatica at present and the feet stay numb most of the time and worse in AM.  Notes her balance has been affected as result.  Attends water aerobics 2x/wk.  Reports episodic instances of her back locking up and prohibits standing up straight. Uses hot pack and TENS unit and rest to ameliorate these episodes Pt accompanied by: self  PERTINENT HISTORY: Chronic low back pain radiating pain to bilateral lower extremity, history of lumbar decompression surgery in the past. Since 2023, she noticed gradual onset numbness tingling at the bottom of her feet, ascending to distal leg level now, also noticed unsteady gait, has to be careful when walking. Reports right knee OA receiving shots.   PAIN:  Are you having pain? Yes: NPRS scale: 5/10 Pain location: right low back Pain description: activity Aggravating factors: activity, vacuuming, bending Relieving factors: hot pack,  medication  PRECAUTIONS: None  RED FLAGS: None   WEIGHT BEARING RESTRICTIONS: No  FALLS: Has patient fallen  in last 6 months? No  LIVING ENVIRONMENT: Lives with: lives with their spouse Lives in: House/apartment Stairs:  a couple stairs in the back, 5-6 steps in front Has following equipment at home: None  PLOF: Independent  PATIENT GOALS: reduce back pain, reduce risk for falls  OBJECTIVE:  Note: Objective measures were completed at Evaluation unless otherwise noted.  DIAGNOSTIC  FINDINGS: EMG nerve conduction study today confirmed chronic bilateral lumbosacral radiculopathy             MRI of lumbar spine from October 2024 showed multilevel degenerative changes, most noticeable L4-5, moderately severe canal stenosis, moderate bilateral foraminal stenosis,             MRI of cervical spine showed multilevel degenerative changes, no evidence of significant canal foraminal narrowing,  COGNITION: Overall cognitive status: Within functional limits for tasks assessed   SENSATION: Not tested, reports sensory disturbance to toes/plantar surface of feet  COORDINATION: WNL  EDEMA:  none  MUSCLE TONE: NT    DTRs:  NT  POSTURE: No Significant postural limitations  LOWER EXTREMITY ROM:     WFL, reports right knee discomfort at end range   LOWER EXTREMITY MMT:    MMT Right Eval Left Eval  Hip flexion 4 4  Hip extension    Hip abduction 4 4  Hip adduction 4 4  Hip internal rotation    Hip external rotation    Knee flexion 5 5  Knee extension 5 5  Ankle dorsiflexion 5 5  Ankle plantarflexion 4 4  Ankle inversion    Ankle eversion    (Blank rows = not tested)  Trunk flexion (rectus abdom.) 3+/5 Multifidus lift-off: minimal activation appreciated in lumbar spine by palpation  BED MOBILITY:  indep  TRANSFERS: Assistive device utilized: None  Sit to stand: Complete Independence Stand to sit: Complete Independence Chair to chair: Complete Independence Floor:  NT   CURB:  NT  STAIRS: NT  GAIT: Gait pattern: WFL Distance walked:  Assistive device utilized:  None Level of assistance: Complete Independence Comments:   FUNCTIONAL TESTS:  5 times sit to stand: 22.91 sec Berg Balance Scale: 53/56  M-CTSIB  Condition 1: Firm Surface, EO 30 Sec, Normal Sway  Condition 2: Firm Surface, EC 30 Sec, Normal Sway  Condition 3: Foam Surface, EO 30 Sec, Mild Sway  Condition 4: Foam Surface, EC 5 Sec, Moderate and Severe Sway    --Difficulty with squatting to retrieve items from floor likely due to right knee derangement.    TODAY'S TREATMENT:                                                                                                                              DATE: 01/01/23    PATIENT EDUCATION: Education details: assessment details, rationale of PT intervention Person educated: Patient Education method: Explanation Education comprehension: verbalized understanding  HOME EXERCISE PROGRAM: TBD  GOALS: Goals reviewed with patient? Yes  SHORT TERM GOALS: Target date: same as LTG    LONG TERM GOALS: Target date: 02/05/2023    Patient will be independent in HEP to improve functional outcomes Baseline:  Goal status: INITIAL  2.  Demo improved BLE strength and reduced risk for falls per time 15 sec 5xSTS Baseline: 29 sec Goal status: INITIAL  3.  Demo improved postural stability per  mild sway x 30 sec condition 4 M-CTSIB Baseline: mod-severe/LOB x 5 sec Goal status: INITIAL  4.  Increase bilat hip abduction strength to 5/5 for improved lumbopelvic stability to reduce pain with mobility Baseline: 4/5 Goal status: INITIAL   ASSESSMENT:  CLINICAL IMPRESSION: Patient is a 82 y.o. lady who was seen today for physical therapy evaluation and treatment for chronic midline LBP and LE parasthesias who presents with weakness to BLE as evident by 5xSTS test and manual muscle testing.  Deficits with balance on compliant surfaces as evident by M-CTSIB condition 4 with LOB.  Trunk weakness and reduced multifidi recruitment by palpation.  Pt  would benefit from PT sessions to improve general strength and instruct in HEP as well as body mechanics to reduce further injury to lumbar spine.   OBJECTIVE IMPAIRMENTS: decreased balance, decreased strength, improper body mechanics, and pain.   ACTIVITY LIMITATIONS: carrying, squatting, transfers, and locomotion level  PARTICIPATION LIMITATIONS: meal prep, cleaning, and community activity  PERSONAL FACTORS: Age, Time since onset of injury/illness/exacerbation, and 1-2 comorbidities: PMH of PVD and lumbar injury/surgery x 2  are also affecting patient's functional outcome.   REHAB POTENTIAL: Excellent  CLINICAL DECISION MAKING: Stable/uncomplicated  EVALUATION COMPLEXITY: Low  PLAN:  PT FREQUENCY: 1x/week  PT DURATION: 4 weeks  PLANNED INTERVENTIONS: 97110-Therapeutic exercises, 97530- Therapeutic activity, [id number]- Neuromuscular re-education, 97535- Self Care, 01027- Manual therapy, [phone]- Gait training, [phone]- Canalith repositioning, [phone]- Aquatic Therapy, 97014- Electrical stimulation (unattended), Patient/Family education, Balance training, Stair training, Dry Needling, Spinal mobilization, Vestibular training, DME instructions, Cryotherapy, Moist heat, and Biofeedback  PLAN FOR NEXT SESSION: bed/mat exercises for hip/lumbar strength, corner balance activities, recommend balance pad   4:37 PM, 01/01/23 M. Shary Decamp, PT, DPT Physical Therapist- [place] Office Number: [phone]   Referring diagnosis? R26.9 (ICD-10-CM) - Gait abnormality R20.2 (ICD-10-CM) - Paresthesia M54.40,G89.29 (ICD-10-CM) - Chronic midline low back pain with sciatica, sciatica laterality unspecified Treatment diagnosis? (if different than referring diagnosis) R26.81, M62.81 What was this (referring dx) caused by? []  Surgery []  Fall [x]  Ongoing issue []  Arthritis []  Other: ____________  Laterality: []  Rt []  Lt [x]  Both  Check all possible CPT codes:  *CHOOSE 10 OR LESS*    See Planned  Interventions listed in the Plan section of the Evaluation.

## 2023-01-03 LAB — VAS US ABI WITH/WO TBI
Left ABI: 0.59
Right ABI: 0.69

## 2023-01-06 DIAGNOSIS — R109 Unspecified abdominal pain: Secondary | ICD-10-CM | POA: Diagnosis not present

## 2023-01-06 DIAGNOSIS — R300 Dysuria: Secondary | ICD-10-CM | POA: Diagnosis not present

## 2023-01-06 DIAGNOSIS — N3000 Acute cystitis without hematuria: Secondary | ICD-10-CM | POA: Diagnosis not present

## 2023-01-16 ENCOUNTER — Ambulatory Visit: Payer: Medicare PPO | Attending: Neurology

## 2023-01-16 DIAGNOSIS — R2681 Unsteadiness on feet: Secondary | ICD-10-CM

## 2023-01-16 DIAGNOSIS — M6281 Muscle weakness (generalized): Secondary | ICD-10-CM

## 2023-01-16 NOTE — Therapy (Addendum)
OUTPATIENT PHYSICAL THERAPY NEURO TREATMENT   Patient Name: Sara Dominguez MRN: [id number] DOB:19-Jul-1940, 82 y.o., female Today's Date: 01/16/2023   PCP: [name], MD REFERRING PROVIDER: Levert Feinstein, MD  END OF SESSION:  PT End of Session - 01/16/23 0846     Visit Number 2    Number of Visits 4    Date for PT Re-Evaluation 02/05/23    Authorization Type [employer]    PT Start Time 0845    PT Stop Time 0930    PT Time Calculation (min) 45 min             Past Medical History:  Diagnosis Date   AAA (abdominal aortic aneurysm), small, history of 2.3 X 2.4 07/06/2011   Abnormal ankle brachial index 07/06/2011   Anxiety    CAD (coronary artery disease), with LAD stent in 2006, chronically occluded OM branch with hx. of Stent to RCA  07/06/2011   Claudication in peripheral vascular disease, life style limiting 07/06/2011   Hyperlipidemia LDL goal < 70 09/08/2012   Hypertension    PAD (peripheral artery disease) [facility])    Past Surgical History:  Procedure Laterality Date   ABDOMINAL AORTAGRAM N/A 07/05/2011   Procedure: ABDOMINAL Ronny Flurry;  Surgeon: Runell Gess, MD;  Location: [facility] CATH LAB;  Service: Cardiovascular;  Laterality: N/A;   ABDOMINAL HYSTERECTOMY  1992   CATARACT EXTRACTION W/ INTRAOCULAR LENS  IMPLANT, BILATERAL Bilateral ~ 2006   CORONARY ANGIOPLASTY WITH STENT PLACEMENT  2006; 2009; 2013   "1 + 1 + 2";  total of 4"   ENTEROSCOPY N/A 08/29/2017   Procedure: ENTEROSCOPY;  Surgeon: Bernette Redbird, MD;  Location: [facility] ENDOSCOPY;  Service: Endoscopy;  Laterality: N/A;   ESOPHAGOGASTRODUODENOSCOPY (EGD) WITH PROPOFOL N/A 06/07/2016   Procedure: ESOPHAGOGASTRODUODENOSCOPY (EGD) WITH PROPOFOL;  Surgeon: Bernette Redbird, MD;  Location: [facility] ENDOSCOPY;  Service: Endoscopy;  Laterality: N/A;   FEMORAL ARTERY STENT Right 2006; 07/05/2011; 05/14/2013   FEMORAL ARTERY STENT Right 05/14/13   between previous stens   HOT HEMOSTASIS N/A 08/29/2017   Procedure: HOT  HEMOSTASIS (ARGON PLASMA COAGULATION/BICAP);  Surgeon: Bernette Redbird, MD;  Location: [facility]OSCOPY;  Service: Endoscopy;  Laterality: N/A;   LOWER EXTREMITY ANGIOGRAM N/A 07/05/2011   Procedure: LOWER EXTREMITY ANGIOGRAM;  Surgeon: Runell Gess, MD;  Location: [facility] CATH LAB;  Service: Cardiovascular;  Laterality: N/A;   LOWER EXTREMITY ANGIOGRAM N/A 05/14/2013   Procedure: LOWER EXTREMITY ANGIOGRAM;  Surgeon: Runell Gess, MD;  Location: [facility] CATH LAB;  Service: Cardiovascular;  Laterality: N/A;   LUMBAR DISC SURGERY  1980;; 1982   PERCUTANEOUS STENT INTERVENTION Right 07/05/2011   Procedure: PERCUTANEOUS STENT INTERVENTION;  Surgeon: Runell Gess, MD;  Location: [facility] CATH LAB;  Service: Cardiovascular;  Laterality: Right;   PERIPHERAL VASCULAR CATHETERIZATION N/A 11/04/2014   Procedure: Lower Extremity Angiography;  Surgeon: Runell Gess, MD;  Location: [facility] INVASIVE CV LAB;  Service: Cardiovascular;  Laterality: N/A;   PERIPHERAL VASCULAR CATHETERIZATION Right 11/04/2014   Procedure: Peripheral Vascular Intervention;  Surgeon: Runell Gess, MD;  Location: [facility] INVASIVE CV LAB;  Service: Cardiovascular;  Laterality: Right;  SFA   SHOULDER OPEN ROTATOR CUFF REPAIR Left ~ 2004   Patient Active Problem List   Diagnosis Date Noted   Gait abnormality 11/09/2022   Paresthesia 11/09/2022   Chronic midline low back pain with sciatica 11/09/2022   PAC (premature atrial contraction) 05/15/2022   SVT (supraventricular tachycardia) (HCC) 05/15/2022   Anemia, iron deficiency 07/03/2017   GI  bleed 06/06/2016   Acute blood loss anemia 06/06/2016   Carotid artery disease (HCC) 12/29/2015   Claudication (HCC) 05/14/2013   Essential hypertension 04/28/2013   Hyperlipidemia with target LDL less than 70 09/08/2012   Claudication in peripheral vascular disease, life style limiting 07/06/2011   Abnormal ankle brachial index 07/06/2011   PAD Rt SFA stent 2006 with ISR 5/13. New RSFA stent 05/14/13  07/06/2011   CAD- LAD stent in 2006, chronically occluded OM branch with hx. of Stent to RCA  07/06/2011   AAA (abdominal aortic aneurysm), small, history of 2.3 X 2.4 07/06/2011    ONSET DATE: years, worsening since 2023  REFERRING DIAG: R26.9 (ICD-10-CM) - Gait abnormality R20.2 (ICD-10-CM) - Paresthesia M54.40,G89.29 (ICD-10-CM) - Chronic midline low back pain with sciatica, sciatica laterality unspecified  THERAPY DIAG:  Unsteadiness on feet  Muscle weakness (generalized)  Rationale for Evaluation and Treatment: Rehabilitation  SUBJECTIVE:                                                                                                                                                                                             SUBJECTIVE STATEMENT: Doing ok. Sometimes wear elastic corset. Back is feeling some what better Pt accompanied by: self  PERTINENT HISTORY: Chronic low back pain radiating pain to bilateral lower extremity, history of lumbar decompression surgery in the past. Since 2023, she noticed gradual onset numbness tingling at the bottom of her feet, ascending to distal leg level now, also noticed unsteady gait, has to be careful when walking. Reports right knee OA receiving shots.   PAIN:  Are you having pain? Yes: NPRS scale: 5/10 Pain location: right low back Pain description: activity Aggravating factors: activity, vacuuming, bending Relieving factors: hot pack,  medication  PRECAUTIONS: None  RED FLAGS: None   WEIGHT BEARING RESTRICTIONS: No  FALLS: Has patient fallen in last 6 months? No  LIVING ENVIRONMENT: Lives with: lives with their spouse Lives in: House/apartment Stairs:  a couple stairs in the back, 5-6 steps in front Has following equipment at home: None  PLOF: Independent  PATIENT GOALS: reduce back pain, reduce risk for falls  OBJECTIVE:   TODAY'S TREATMENT: 01/16/23 Activity Comments  Straight arm pull down 2x10 Green band  Paloff  press 2x10 Red loop  SKTC/DKTC 3x 5 deep breaths   Hooklying hip flexion iso 2x5 reps   Tandem stance 2x15 sec        Access Code: [email or web address] URL: [email or web address] Date: 01/21/2023 Prepared by: Shary Decamp  Exercises - Shoulder Extension with Resistance  - 1 x daily - 7 x weekly - 3 sets - 10 reps -  Standing Anti-Rotation Press with Anchored Resistance  - 1 x daily - 7 x weekly - 1-3 sets - 10 reps - Hooklying Single Knee to Chest Stretch  - 1 x daily - 7 x weekly - 3 sets - 5 deep breaths hold - Supine Double Knee to Chest  - 1 x daily - 7 x weekly - 3 sets - 5 deep breaths hold - Hooklying Isometric Hip Flexion  - 1 x daily - 7 x weekly - 3 sets - 5 reps - 2 sec hold hold - Standing Tandem Balance with Counter Support  - 1 x daily - 7 x weekly - 3 sets - 15 sec hold  Note: Objective measures were completed at Evaluation unless otherwise noted.  DIAGNOSTIC FINDINGS: EMG nerve conduction study today confirmed chronic bilateral lumbosacral radiculopathy             MRI of lumbar spine from October 2024 showed multilevel degenerative changes, most noticeable L4-5, moderately severe canal stenosis, moderate bilateral foraminal stenosis,             MRI of cervical spine showed multilevel degenerative changes, no evidence of significant canal foraminal narrowing,  COGNITION: Overall cognitive status: Within functional limits for tasks assessed   SENSATION: Not tested, reports sensory disturbance to toes/plantar surface of feet  COORDINATION: WNL  EDEMA:  none  MUSCLE TONE: NT    DTRs:  NT  POSTURE: No Significant postural limitations  LOWER EXTREMITY ROM:     WFL, reports right knee discomfort at end range   LOWER EXTREMITY MMT:    MMT Right Eval Left Eval  Hip flexion 4 4  Hip extension    Hip abduction 4 4  Hip adduction 4 4  Hip internal rotation    Hip external rotation    Knee flexion 5 5  Knee extension 5 5  Ankle dorsiflexion 5 5   Ankle plantarflexion 4 4  Ankle inversion    Ankle eversion    (Blank rows = not tested)  Trunk flexion (rectus abdom.) 3+/5 Multifidus lift-off: minimal activation appreciated in lumbar spine by palpation  BED MOBILITY:  indep  TRANSFERS: Assistive device utilized: None  Sit to stand: Complete Independence Stand to sit: Complete Independence Chair to chair: Complete Independence Floor:  NT   CURB:  NT  STAIRS: NT  GAIT: Gait pattern: WFL Distance walked:  Assistive device utilized: None Level of assistance: Complete Independence Comments:   FUNCTIONAL TESTS:  5 times sit to stand: 22.91 sec Berg Balance Scale: 53/56  M-CTSIB  Condition 1: Firm Surface, EO 30 Sec, Normal Sway  Condition 2: Firm Surface, EC 30 Sec, Normal Sway  Condition 3: Foam Surface, EO 30 Sec, Mild Sway  Condition 4: Foam Surface, EC 5 Sec, Moderate and Severe Sway    --Difficulty with squatting to retrieve items from floor likely due to right knee derangement.    TODAY'S TREATMENT:  DATE: 01/01/23    PATIENT EDUCATION: Education details: assessment details, rationale of PT intervention Person educated: Patient Education method: Explanation Education comprehension: verbalized understanding  HOME EXERCISE PROGRAM: TBD  GOALS: Goals reviewed with patient? Yes  SHORT TERM GOALS: Target date: same as LTG    LONG TERM GOALS: Target date: 02/05/2023    Patient will be independent in HEP to improve functional outcomes Baseline:  Goal status: INITIAL  2.  Demo improved BLE strength and reduced risk for falls per time 15 sec 5xSTS Baseline: 29 sec Goal status: INITIAL  3.  Demo improved postural stability per mild sway x 30 sec condition 4 M-CTSIB Baseline: mod-severe/LOB x 5 sec Goal status: INITIAL  4.  Increase bilat hip abduction strength to 5/5 for  improved lumbopelvic stability to reduce pain with mobility Baseline: 4/5 Goal status: INITIAL   ASSESSMENT:  CLINICAL IMPRESSION: Initiated HEP development with emphasis on core strength/stabilization exercises to improve engagement for body mechanics to reduce pain/dysfunction.  Good return demonstration following instruction. Training in lumbar flexion stretching exercises to reduce pain/stiffness from stenosis.  Discussed body mechanics with household activities such as vacuuming recommending changing hands and step pattern to reduce repetitive stress and discussed techniques such as elevating foot when performing dishes to impose some lumbar flexion. No questions noted and tolerated session well  OBJECTIVE IMPAIRMENTS: decreased balance, decreased strength, improper body mechanics, and pain.   ACTIVITY LIMITATIONS: carrying, squatting, transfers, and locomotion level  PARTICIPATION LIMITATIONS: meal prep, cleaning, and community activity  PERSONAL FACTORS: Age, Time since onset of injury/illness/exacerbation, and 1-2 comorbidities: PMH of PVD and lumbar injury/surgery x 2  are also affecting patient's functional outcome.   REHAB POTENTIAL: Excellent  CLINICAL DECISION MAKING: Stable/uncomplicated  EVALUATION COMPLEXITY: Low  PLAN:  PT FREQUENCY: 1x/week  PT DURATION: 4 weeks  PLANNED INTERVENTIONS: 97110-Therapeutic exercises, 97530- Therapeutic activity, [id number]- Neuromuscular re-education, 97535- Self Care, 16109- Manual therapy, [phone]- Gait training, [phone]- Canalith repositioning, [phone]- Aquatic Therapy, 97014- Electrical stimulation (unattended), Patient/Family education, Balance training, Stair training, Dry Needling, Spinal mobilization, Vestibular training, DME instructions, Cryotherapy, Moist heat, and Biofeedback  PLAN FOR NEXT SESSION: HEP review, bed/mat exercises for hip/lumbar strength, corner balance activities, recommend balance pad   8:46 AM, 01/16/23 M. Shary Decamp, PT, DPT Physical Therapist-  Office Number: [phone]

## 2023-01-19 ENCOUNTER — Other Ambulatory Visit: Payer: Self-pay | Admitting: Neurology

## 2023-01-21 ENCOUNTER — Ambulatory Visit: Payer: Medicare PPO | Admitting: Physical Therapy

## 2023-01-21 ENCOUNTER — Encounter: Payer: Self-pay | Admitting: Physical Therapy

## 2023-01-21 DIAGNOSIS — R2681 Unsteadiness on feet: Secondary | ICD-10-CM | POA: Diagnosis not present

## 2023-01-21 DIAGNOSIS — M6281 Muscle weakness (generalized): Secondary | ICD-10-CM

## 2023-01-21 NOTE — Therapy (Signed)
OUTPATIENT PHYSICAL THERAPY NEURO TREATMENT   Patient Name: Sara Dominguez MRN: [id number] DOB:15-Sep-1940, 82 y.o., female Today's Date: 01/21/2023   PCP: [name], MD REFERRING PROVIDER: Levert Feinstein, MD  END OF SESSION:  PT End of Session - 01/21/23 0933     Visit Number 3    Number of Visits 4    Date for PT Re-Evaluation 02/05/23    Authorization Type [employer]    PT Start Time [phone]    PT Stop Time 1015    PT Time Calculation (min) 41 min    Activity Tolerance Patient tolerated treatment well   Pt reports less pain at end of session   Behavior During Therapy [facility] for tasks assessed/performed              Past Medical History:  Diagnosis Date   AAA (abdominal aortic aneurysm), small, history of 2.3 X 2.4 07/06/2011   Abnormal ankle brachial index 07/06/2011   Anxiety    CAD (coronary artery disease), with LAD stent in 2006, chronically occluded OM branch with hx. of Stent to RCA  07/06/2011   Claudication in peripheral vascular disease, life style limiting 07/06/2011   Hyperlipidemia LDL goal < 70 09/08/2012   Hypertension    PAD (peripheral artery disease) (HCC)    Past Surgical History:  Procedure Laterality Date   ABDOMINAL AORTAGRAM N/A 07/05/2011   Procedure: ABDOMINAL Ronny Flurry;  Surgeon: Runell Gess, MD;  Location: [facility] CATH LAB;  Service: Cardiovascular;  Laterality: N/A;   ABDOMINAL HYSTERECTOMY  1992   CATARACT EXTRACTION W/ INTRAOCULAR LENS  IMPLANT, BILATERAL Bilateral ~ 2006   CORONARY ANGIOPLASTY WITH STENT PLACEMENT  2006; 2009; 2013   "1 + 1 + 2";  total of 4"   ENTEROSCOPY N/A 08/29/2017   Procedure: ENTEROSCOPY;  Surgeon: Bernette Redbird, MD;  Location: [facility] ENDOSCOPY;  Service: Endoscopy;  Laterality: N/A;   ESOPHAGOGASTRODUODENOSCOPY (EGD) WITH PROPOFOL N/A 06/07/2016   Procedure: ESOPHAGOGASTRODUODENOSCOPY (EGD) WITH PROPOFOL;  Surgeon: Bernette Redbird, MD;  Location: [facility] ENDOSCOPY;  Service: Endoscopy;  Laterality: N/A;   FEMORAL ARTERY  STENT Right 2006; 07/05/2011; 05/14/2013   FEMORAL ARTERY STENT Right 05/14/13   between previous stens   HOT HEMOSTASIS N/A 08/29/2017   Procedure: HOT HEMOSTASIS (ARGON PLASMA COAGULATION/BICAP);  Surgeon: Bernette Redbird, MD;  Location: [facility] ENDOSCOPY;  Service: Endoscopy;  Laterality: N/A;   LOWER EXTREMITY ANGIOGRAM N/A 07/05/2011   Procedure: LOWER EXTREMITY ANGIOGRAM;  Surgeon: Runell Gess, MD;  Location: [facility] CATH LAB;  Service: Cardiovascular;  Laterality: N/A;   LOWER EXTREMITY ANGIOGRAM N/A 05/14/2013   Procedure: LOWER EXTREMITY ANGIOGRAM;  Surgeon: Runell Gess, MD;  Location: [facility] CATH LAB;  Service: Cardiovascular;  Laterality: N/A;   LUMBAR DISC SURGERY  1980;; 1982   PERCUTANEOUS STENT INTERVENTION Right 07/05/2011   Procedure: PERCUTANEOUS STENT INTERVENTION;  Surgeon: Runell Gess, MD;  Location: [facility] CATH LAB;  Service: Cardiovascular;  Laterality: Right;   PERIPHERAL VASCULAR CATHETERIZATION N/A 11/04/2014   Procedure: Lower Extremity Angiography;  Surgeon: Runell Gess, MD;  Location: [facility] INVASIVE CV LAB;  Service: Cardiovascular;  Laterality: N/A;   PERIPHERAL VASCULAR CATHETERIZATION Right 11/04/2014   Procedure: Peripheral Vascular Intervention;  Surgeon: Runell Gess, MD;  Location: [facility] INVASIVE CV LAB;  Service: Cardiovascular;  Laterality: Right;  SFA   SHOULDER OPEN ROTATOR CUFF REPAIR Left ~ 2004   Patient Active Problem List   Diagnosis Date Noted   Gait abnormality 11/09/2022   Paresthesia 11/09/2022   Chronic midline  low back pain with sciatica 11/09/2022   PAC (premature atrial contraction) 05/15/2022   SVT (supraventricular tachycardia) (HCC) 05/15/2022   Anemia, iron deficiency 07/03/2017   GI bleed 06/06/2016   Acute blood loss anemia 06/06/2016   Carotid artery disease (HCC) 12/29/2015   Claudication (HCC) 05/14/2013   Essential hypertension 04/28/2013   Hyperlipidemia with target LDL less than 70 09/08/2012   Claudication in peripheral vascular  disease, life style limiting 07/06/2011   Abnormal ankle brachial index 07/06/2011   PAD Rt SFA stent 2006 with ISR 5/13. New RSFA stent 05/14/13 07/06/2011   CAD- LAD stent in 2006, chronically occluded OM branch with hx. of Stent to RCA  07/06/2011   AAA (abdominal aortic aneurysm), small, history of 2.3 X 2.4 07/06/2011    ONSET DATE: years, worsening since 2023  REFERRING DIAG: R26.9 (ICD-10-CM) - Gait abnormality R20.2 (ICD-10-CM) - Paresthesia M54.40,G89.29 (ICD-10-CM) - Chronic midline low back pain with sciatica, sciatica laterality unspecified  THERAPY DIAG:  Unsteadiness on feet  Muscle weakness (generalized)  Rationale for Evaluation and Treatment: Rehabilitation  SUBJECTIVE:                                                                                                                                                                                             SUBJECTIVE STATEMENT: Feel like things are getting better. Pt accompanied by: self  PERTINENT HISTORY: Chronic low back pain radiating pain to bilateral lower extremity, history of lumbar decompression surgery in the past. Since 2023, she noticed gradual onset numbness tingling at the bottom of her feet, ascending to distal leg level now, also noticed unsteady gait, has to be careful when walking. Reports right knee OA receiving shots.   PAIN:  Are you having pain? Yes: NPRS scale: 3/10 Pain location: right low back>R leg Pain description: activity Aggravating factors: activity, vacuuming, bending Relieving factors: hot pack,  medication  PRECAUTIONS: None  RED FLAGS: None   WEIGHT BEARING RESTRICTIONS: No  FALLS: Has patient fallen in last 6 months? No  LIVING ENVIRONMENT: Lives with: lives with their spouse Lives in: House/apartment Stairs:  a couple stairs in the back, 5-6 steps in front Has following equipment at home: None  PLOF: Independent  PATIENT GOALS: reduce back pain, reduce risk for  falls  OBJECTIVE:   Pt verbalizes understanding of HEP from last visit.  TODAY'S TREATMENT: 01/21/2023 Activity Comments  SKTC, 5 deep breaths, 3 reps each leg Good form  Hooklying hip flexion iso x5 reps Good form  Hooklying hip abduction with band 2 x 5   Bridging 2 x 5 reps, green band around knees Good  form, no pain  Sit to stand, 5 reps Green band around knees  Tandem stance, tandem gait Very slowed tandem gait with UE support  Sidestepping along counter, 2 x along the counter   Romberg stance EO head turns/nods x 5, and EC 30 sec 3 reps Lessening UE support, but with increased trunk sway    Access Code: [email or web address] URL: [email or web address] Date: 01/21/2023 Prepared by: [facility] - Outpatient  Re[facility] - [facility]  Exercises - Shoulder Extension with Resistance  - 1 x daily - 7 x weekly - 3 sets - 10 reps - Standing Anti-Rotation Press with Anchored Resistance  - 1 x daily - 7 x weekly - 1-3 sets - 10 reps - Hooklying Single Knee to Chest Stretch  - 1 x daily - 7 x weekly - 3 sets - 5 deep breaths hold - Supine Double Knee to Chest  - 1 x daily - 7 x weekly - 3 sets - 5 deep breaths hold - Hooklying Isometric Hip Flexion  - 1 x daily - 7 x weekly - 3 sets - 5 reps - 2 sec hold hold - Standing Tandem Balance with Counter Support  - 1 x daily - 7 x weekly - 3 sets - 15 sec hold - Hooklying Clamshell with Resistance  - 1 x daily - 5 x weekly - 3 sets - 5 reps - Narrow Stance with Counter Support  - 1 x daily - 7 x weekly - 1 sets - 3 reps - 30 sec hold  PATIENT EDUCATION: Education details: HEP additions; fall prevention in light of heavy visual reliance for balance-plenty of light in darkened areas Person educated: Patient Education method: Explanation, Demonstration, and Handouts Education comprehension: verbalized understanding and returned demonstration     ------------------------------------------------------------------------------- Note: Objective  measures were completed at Evaluation unless otherwise noted.  DIAGNOSTIC FINDINGS: EMG nerve conduction study today confirmed chronic bilateral lumbosacral radiculopathy             MRI of lumbar spine from October 2024 showed multilevel degenerative changes, most noticeable L4-5, moderately severe canal stenosis, moderate bilateral foraminal stenosis,             MRI of cervical spine showed multilevel degenerative changes, no evidence of significant canal foraminal narrowing,  COGNITION: Overall cognitive status: Within functional limits for tasks assessed   SENSATION: Not tested, reports sensory disturbance to toes/plantar surface of feet  COORDINATION: WNL  EDEMA:  none  MUSCLE TONE: NT    DTRs:  NT  POSTURE: No Significant postural limitations  LOWER EXTREMITY ROM:     WFL, reports right knee discomfort at end range   LOWER EXTREMITY MMT:    MMT Right Eval Left Eval  Hip flexion 4 4  Hip extension    Hip abduction 4 4  Hip adduction 4 4  Hip internal rotation    Hip external rotation    Knee flexion 5 5  Knee extension 5 5  Ankle dorsiflexion 5 5  Ankle plantarflexion 4 4  Ankle inversion    Ankle eversion    (Blank rows = not tested)  Trunk flexion (rectus abdom.) 3+/5 Multifidus lift-off: minimal activation appreciated in lumbar spine by palpation  BED MOBILITY:  indep  TRANSFERS: Assistive device utilized: None  Sit to stand: Complete Independence Stand to sit: Complete Independence Chair to chair: Complete Independence Floor:  NT   CURB:  NT  STAIRS: NT  GAIT: Gait pattern: WFL Distance walked:  Assistive device utilized: None  Level of assistance: Complete Independence Comments:   FUNCTIONAL TESTS:  5 times sit to stand: 22.91 sec Berg Balance Scale: 53/56  M-CTSIB  Condition 1: Firm Surface, EO 30 Sec, Normal Sway  Condition 2: Firm Surface, EC 30 Sec, Normal Sway  Condition 3: Foam Surface, EO 30 Sec, Mild Sway   Condition 4: Foam Surface, EC 5 Sec, Moderate and Severe Sway    --Difficulty with squatting to retrieve items from floor likely due to right knee derangement.    TODAY'S TREATMENT:                                                                                                                              DATE: 01/01/23    PATIENT EDUCATION: Education details: assessment details, rationale of PT intervention Person educated: Patient Education method: Explanation Education comprehension: verbalized understanding  HOME EXERCISE PROGRAM: TBD  GOALS: Goals reviewed with patient? Yes  SHORT TERM GOALS: Target date: same as LTG    LONG TERM GOALS: Target date: 02/05/2023    Patient will be independent in HEP to improve functional outcomes Baseline:  Goal status: IN PROGRESS  2.  Demo improved BLE strength and reduced risk for falls per time 15 sec 5xSTS Baseline: 29 sec Goal status: IN PROGRESS  3.  Demo improved postural stability per mild sway x 30 sec condition 4 M-CTSIB Baseline: mod-severe/LOB x 5 sec Goal status: IN PROGRESS  4.  Increase bilat hip abduction strength to 5/5 for improved lumbopelvic stability to reduce pain with mobility Baseline: 4/5 Goal status: IN PROGRESS   ASSESSMENT:  CLINICAL IMPRESSION: Skilled PT session today focused on review of and progression of exercise.  Focused on core strength/stabilization exercises and hip stability exercises to improve body mechanics and reduce pain; pt does report at end of session that she has less pain.  Standing balance exercises performed, with pt tending to look to feet to aid in balance; pt performs standing balance ex much slower and with UE support with vision removed or with cues to look ahead at target.  She will continue to benefit from skilled PT towards LTGs for improved overall functional mobility and decreased pain.  OBJECTIVE IMPAIRMENTS: decreased balance, decreased strength, improper body  mechanics, and pain.   ACTIVITY LIMITATIONS: carrying, squatting, transfers, and locomotion level  PARTICIPATION LIMITATIONS: meal prep, cleaning, and community activity  PERSONAL FACTORS: Age, Time since onset of injury/illness/exacerbation, and 1-2 comorbidities: PMH of PVD and lumbar injury/surgery x 2  are also affecting patient's functional outcome.   REHAB POTENTIAL: Excellent  CLINICAL DECISION MAKING: Stable/uncomplicated  EVALUATION COMPLEXITY: Low  PLAN:  PT FREQUENCY: 1x/week  PT DURATION: 4 weeks  PLANNED INTERVENTIONS: 97110-Therapeutic exercises, 97530- Therapeutic activity, [id number]- Neuromuscular re-education, 97535- Self Care, 16109- Manual therapy, [phone]- Gait training, [phone]- Canalith repositioning, [id number]- Aquatic Therapy, 97014- Electrical stimulation (unattended), Patient/Family education, Balance training, Stair training, Dry Needling, Spinal mobilization, Vestibular training, DME instructions, Cryotherapy, Moist heat, and Biofeedback  PLAN FOR NEXT SESSION: HEP review, bed/mat exercises for hip/lumbar strength, corner balance activities, recommend balance pad. Check LTGs and discuss POC.   Lonia Blood, PT 01/21/23 11:24 AM Phone: [phone] Fax: [phone]  [facility] at [facility] [address] [place], [address] [place], [place] [address] Phone # [phone] Fax # [phone]

## 2023-01-22 ENCOUNTER — Ambulatory Visit: Payer: Medicare PPO

## 2023-01-23 ENCOUNTER — Encounter (INDEPENDENT_AMBULATORY_CARE_PROVIDER_SITE_OTHER): Payer: Medicare PPO | Admitting: Ophthalmology

## 2023-01-23 DIAGNOSIS — H348322 Tributary (branch) retinal vein occlusion, left eye, stable: Secondary | ICD-10-CM | POA: Diagnosis not present

## 2023-01-23 DIAGNOSIS — I10 Essential (primary) hypertension: Secondary | ICD-10-CM | POA: Diagnosis not present

## 2023-01-23 DIAGNOSIS — H43813 Vitreous degeneration, bilateral: Secondary | ICD-10-CM

## 2023-01-23 DIAGNOSIS — H35033 Hypertensive retinopathy, bilateral: Secondary | ICD-10-CM

## 2023-01-25 ENCOUNTER — Encounter (INDEPENDENT_AMBULATORY_CARE_PROVIDER_SITE_OTHER): Payer: Medicare PPO | Admitting: Ophthalmology

## 2023-01-29 ENCOUNTER — Encounter: Payer: Self-pay | Admitting: Physical Therapy

## 2023-01-29 ENCOUNTER — Ambulatory Visit: Payer: Medicare PPO | Admitting: Physical Therapy

## 2023-01-29 DIAGNOSIS — M6281 Muscle weakness (generalized): Secondary | ICD-10-CM | POA: Diagnosis not present

## 2023-01-29 DIAGNOSIS — R2681 Unsteadiness on feet: Secondary | ICD-10-CM

## 2023-01-29 NOTE — Therapy (Signed)
OUTPATIENT PHYSICAL THERAPY NEURO TREATMENT   Patient Name: Sara Dominguez MRN: [id number] DOB:1940/03/15, 82 y.o., female Today's Date: 01/29/2023   PCP: [name], MD REFERRING PROVIDER: Levert Feinstein, MD  END OF SESSION:  PT End of Session - 01/29/23 0843     Visit Number 4    Number of Visits 4    Date for PT Re-Evaluation 02/05/23    Authorization Type [employer]    Authorization Time Period 01/01/2023-02/05/2023    Authorization - Visit Number 4    Authorization - Number of Visits 4    PT Start Time 0847    PT Stop Time 0925    PT Time Calculation (min) 38 min    Activity Tolerance Patient tolerated treatment well   Pt reports less pain at end of session   Behavior During Therapy [facility] for tasks assessed/performed               Past Medical History:  Diagnosis Date   AAA (abdominal aortic aneurysm), small, history of 2.3 X 2.4 07/06/2011   Abnormal ankle brachial index 07/06/2011   Anxiety    CAD (coronary artery disease), with LAD stent in 2006, chronically occluded OM branch with hx. of Stent to RCA  07/06/2011   Claudication in peripheral vascular disease, life style limiting 07/06/2011   Hyperlipidemia LDL goal < 70 09/08/2012   Hypertension    PAD (peripheral artery disease) (HCC)    Past Surgical History:  Procedure Laterality Date   ABDOMINAL AORTAGRAM N/A 07/05/2011   Procedure: ABDOMINAL Ronny Flurry;  Surgeon: Runell Gess, MD;  Location: [facility] CATH LAB;  Service: Cardiovascular;  Laterality: N/A;   ABDOMINAL HYSTERECTOMY  1992   CATARACT EXTRACTION W/ INTRAOCULAR LENS  IMPLANT, BILATERAL Bilateral ~ 2006   CORONARY ANGIOPLASTY WITH STENT PLACEMENT  2006; 2009; 2013   "1 + 1 + 2";  total of 4"   ENTEROSCOPY N/A 08/29/2017   Procedure: ENTEROSCOPY;  Surgeon: Bernette Redbird, MD;  Location: [facility] ENDOSCOPY;  Service: Endoscopy;  Laterality: N/A;   ESOPHAGOGASTRODUODENOSCOPY (EGD) WITH PROPOFOL N/A 06/07/2016   Procedure: ESOPHAGOGASTRODUODENOSCOPY  (EGD) WITH PROPOFOL;  Surgeon: Bernette Redbird, MD;  Location: [facility] ENDOSCOPY;  Service: Endoscopy;  Laterality: N/A;   FEMORAL ARTERY STENT Right 2006; 07/05/2011; 05/14/2013   FEMORAL ARTERY STENT Right 05/14/13   between previous stens   HOT HEMOSTASIS N/A 08/29/2017   Procedure: HOT HEMOSTASIS (ARGON PLASMA COAGULATION/BICAP);  Surgeon: Bernette Redbird, MD;  Location: [facility] ENDOSCOPY;  Service: Endoscopy;  Laterality: N/A;   LOWER EXTREMITY ANGIOGRAM N/A 07/05/2011   Procedure: LOWER EXTREMITY ANGIOGRAM;  Surgeon: Runell Gess, MD;  Location: [facility] CATH LAB;  Service: Cardiovascular;  Laterality: N/A;   LOWER EXTREMITY ANGIOGRAM N/A 05/14/2013   Procedure: LOWER EXTREMITY ANGIOGRAM;  Surgeon: Runell Gess, MD;  Location: [facility] CATH LAB;  Service: Cardiovascular;  Laterality: N/A;   LUMBAR DISC SURGERY  1980;; 1982   PERCUTANEOUS STENT INTERVENTION Right 07/05/2011   Procedure: PERCUTANEOUS STENT INTERVENTION;  Surgeon: Runell Gess, MD;  Location: [facility] CATH LAB;  Service: Cardiovascular;  Laterality: Right;   PERIPHERAL VASCULAR CATHETERIZATION N/A 11/04/2014   Procedure: Lower Extremity Angiography;  Surgeon: Runell Gess, MD;  Location: [facility] INVASIVE CV LAB;  Service: Cardiovascular;  Laterality: N/A;   PERIPHERAL VASCULAR CATHETERIZATION Right 11/04/2014   Procedure: Peripheral Vascular Intervention;  Surgeon: Runell Gess, MD;  Location: [facility] INVASIVE CV LAB;  Service: Cardiovascular;  Laterality: Right;  SFA   SHOULDER OPEN ROTATOR CUFF REPAIR Left ~  2004   Patient Active Problem List   Diagnosis Date Noted   Gait abnormality 11/09/2022   Paresthesia 11/09/2022   Chronic midline low back pain with sciatica 11/09/2022   PAC (premature atrial contraction) 05/15/2022   SVT (supraventricular tachycardia) (HCC) 05/15/2022   Anemia, iron deficiency 07/03/2017   GI bleed 06/06/2016   Acute blood loss anemia 06/06/2016   Carotid artery disease (HCC) 12/29/2015   Claudication (HCC) 05/14/2013    Essential hypertension 04/28/2013   Hyperlipidemia with target LDL less than 70 09/08/2012   Claudication in peripheral vascular disease, life style limiting 07/06/2011   Abnormal ankle brachial index 07/06/2011   PAD Rt SFA stent 2006 with ISR 5/13. New RSFA stent 05/14/13 07/06/2011   CAD- LAD stent in 2006, chronically occluded OM branch with hx. of Stent to RCA  07/06/2011   AAA (abdominal aortic aneurysm), small, history of 2.3 X 2.4 07/06/2011    ONSET DATE: years, worsening since 2023  REFERRING DIAG: R26.9 (ICD-10-CM) - Gait abnormality R20.2 (ICD-10-CM) - Paresthesia M54.40,G89.29 (ICD-10-CM) - Chronic midline low back pain with sciatica, sciatica laterality unspecified  THERAPY DIAG:  Unsteadiness on feet  Muscle weakness (generalized)  Rationale for Evaluation and Treatment: Rehabilitation  SUBJECTIVE:                                                                                                                                                                                             SUBJECTIVE STATEMENT: Been doing exercises and my back has been doing pretty good.  Went to water aerobics this morning. Back was bothering me when I woke up.   Overall, pain is better and has helped a little with my balance.  Pt accompanied by: self  PERTINENT HISTORY: Chronic low back pain radiating pain to bilateral lower extremity, history of lumbar decompression surgery in the past. Since 2023, she noticed gradual onset numbness tingling at the bottom of her feet, ascending to distal leg level now, also noticed unsteady gait, has to be careful when walking. Reports right knee OA receiving shots.   PAIN:  Are you having pain? Yes: NPRS scale: 6/10 Pain location: right low back Pain description: activity Aggravating factors: activity, vacuuming, bending Relieving factors: hot pack,  medication  PRECAUTIONS: None  RED FLAGS: None   WEIGHT BEARING RESTRICTIONS: No  FALLS: Has  patient fallen in last 6 months? No  LIVING ENVIRONMENT: Lives with: lives with their spouse Lives in: House/apartment Stairs:  a couple stairs in the back, 5-6 steps in front Has following equipment at home: None  PLOF: Independent  PATIENT GOALS: reduce back pain, reduce risk for falls  OBJECTIVE:      TODAY'S TREATMENT: 01/29/2023 Activity Comments  Reviewed supine DKTC and SKTC; hooklying clamshell as part of HEP   Review of Romberg stance EO head turns/nods Progressed to EC Romberg head turns/nods Min cues  FTSTS:  20 sec Improved from 29 sec  Tandem gait along counter, 2 reps Light to no UE support, cues for visual target  Sidestepping at counter, R and L 3 reps No UE support  Forward/back walking at counter, 3 reps No UE supprot    M-CTSIB  Condition 1: Firm Surface, EO 30 Sec, Normal Sway  Condition 2: Firm Surface, EC 30 Sec, Normal Sway  Condition 3: Foam Surface, EO 30 Sec, Mild Sway  Condition 4: Foam Surface, EC 30 Sec, Moderate Sway      Access Code: [email or web address] URL: [email or web address] Date: 01/21/2023 Prepared by: [facility] - Outpatient  Rehab - [facility]  Exercises - Shoulder Extension with Resistance  - 1 x daily - 7 x weekly - 3 sets - 10 reps - Standing Anti-Rotation Press with Anchored Resistance  - 1 x daily - 7 x weekly - 1-3 sets - 10 reps - Hooklying Single Knee to Chest Stretch  - 1 x daily - 7 x weekly - 3 sets - 5 deep breaths hold - Supine Double Knee to Chest  - 1 x daily - 7 x weekly - 3 sets - 5 deep breaths hold - Hooklying Isometric Hip Flexion  - 1 x daily - 7 x weekly - 3 sets - 5 reps - 2 sec hold hold - Standing Tandem Balance with Counter Support  - 1 x daily - 7 x weekly - 3 sets - 15 sec hold - Hooklying Clamshell with Resistance  - 1 x daily - 5 x weekly - 3 sets - 5 reps - Narrow Stance with Counter Support  - 1 x daily - 7 x weekly - 1 sets - 3 reps - 30 sec hold  PATIENT EDUCATION: Education  details: Review of HEP, progress towards goals, POC and plan to hold chart open for 1 month; pt would like to work on her exercises and return if needed.  Person educated: Patient Education method: Explanation, Demonstration, and Handouts Education comprehension: verbalized understanding and returned demonstration     ------------------------------------------------------------------------------- Note: Objective measures were completed at Evaluation unless otherwise noted.  DIAGNOSTIC FINDINGS: EMG nerve conduction study today confirmed chronic bilateral lumbosacral radiculopathy             MRI of lumbar spine from October 2024 showed multilevel degenerative changes, most noticeable L4-5, moderately severe canal stenosis, moderate bilateral foraminal stenosis,             MRI of cervical spine showed multilevel degenerative changes, no evidence of significant canal foraminal narrowing,  COGNITION: Overall cognitive status: Within functional limits for tasks assessed   SENSATION: Not tested, reports sensory disturbance to toes/plantar surface of feet  COORDINATION: WNL  EDEMA:  none  MUSCLE TONE: NT    DTRs:  NT  POSTURE: No Significant postural limitations  LOWER EXTREMITY ROM:     WFL, reports right knee discomfort at end range   LOWER EXTREMITY MMT:    MMT Right Eval Left Eval  Hip flexion 4 4  Hip extension    Hip abduction 4 4  Hip adduction 4 4  Hip internal rotation    Hip external rotation    Knee flexion 5 5  Knee extension 5 5  Ankle dorsiflexion 5  5  Ankle plantarflexion 4 4  Ankle inversion    Ankle eversion    (Blank rows = not tested)  Trunk flexion (rectus abdom.) 3+/5 Multifidus lift-off: minimal activation appreciated in lumbar spine by palpation  BED MOBILITY:  indep  TRANSFERS: Assistive device utilized: None  Sit to stand: Complete Independence Stand to sit: Complete Independence Chair to chair: Complete Independence Floor:   NT   CURB:  NT  STAIRS: NT  GAIT: Gait pattern: WFL Distance walked:  Assistive device utilized: None Level of assistance: Complete Independence Comments:   FUNCTIONAL TESTS:  5 times sit to stand: 22.91 sec Berg Balance Scale: 53/56  M-CTSIB  Condition 1: Firm Surface, EO 30 Sec, Normal Sway  Condition 2: Firm Surface, EC 30 Sec, Normal Sway  Condition 3: Foam Surface, EO 30 Sec, Mild Sway  Condition 4: Foam Surface, EC 5 Sec, Moderate and Severe Sway    --Difficulty with squatting to retrieve items from floor likely due to right knee derangement.    TODAY'S TREATMENT:                                                                                                                              DATE: 01/01/23    PATIENT EDUCATION: Education details: assessment details, rationale of PT intervention Person educated: Patient Education method: Explanation Education comprehension: verbalized understanding  HOME EXERCISE PROGRAM: TBD  GOALS: Goals reviewed with patient? Yes  SHORT TERM GOALS: Target date: same as LTG    LONG TERM GOALS: Target date: 02/05/2023    Patient will be independent in HEP to improve functional outcomes Baseline:  Goal status: MET, 01/29/2023  2.  Demo improved BLE strength and reduced risk for falls per time 15 sec 5xSTS Baseline: 29 sec>20 sec Goal status: PARTIALLY MET  01/29/2023  3.  Demo improved postural stability per mild sway x 30 sec condition 4 M-CTSIB Baseline: mod sway 30 sec Goal status: PARTIALLY MET 01/29/2023  4.  Increase bilat hip abduction strength to 5/5 for improved lumbopelvic stability to reduce pain with mobility Baseline: 4/5>4+/5 01/29/2023 Goal status: PARTIALLY MET 01/29/2023   ASSESSMENT:  CLINICAL IMPRESSION: Pt arrived today reporting overall improved pain and balance, but today she is noting increased back pain.  Upon further discussion, she does not have any radiating pain into RLE, only pain in  low back.  Educated patient to continue to focus on flexibility and stabilizing exercise, and that centralized back pain only is better than back pain + radiating leg pain.  Focused today on assessing goals, with pt objectively improving strength and balance measures, just not to goal level, with STGs 2-4 partially met.  STG 1 met for HEP.  Pt feels she can continue to do her exercises at home and feels good about her functional level.  She would like to hold on further PT sessions; plan to hold chart open x 30 days and if pt doesn't feel she needs PT  any more at that point, will plan for d/c.  OBJECTIVE IMPAIRMENTS: decreased balance, decreased strength, improper body mechanics, and pain.   ACTIVITY LIMITATIONS: carrying, squatting, transfers, and locomotion level  PARTICIPATION LIMITATIONS: meal prep, cleaning, and community activity  PERSONAL FACTORS: Age, Time since onset of injury/illness/exacerbation, and 1-2 comorbidities: PMH of PVD and lumbar injury/surgery x 2  are also affecting patient's functional outcome.   REHAB POTENTIAL: Excellent  CLINICAL DECISION MAKING: Stable/uncomplicated  EVALUATION COMPLEXITY: Low  PLAN:  PT FREQUENCY: 1x/week  PT DURATION: 4 weeks  PLANNED INTERVENTIONS: 97110-Therapeutic exercises, 97530- Therapeutic activity, [id number]- Neuromuscular re-education, 97535- Self Care, 16109- Manual therapy, [phone]- Gait training, [phone]- Canalith repositioning, [phone]- Aquatic Therapy, 97014- Electrical stimulation (unattended), Patient/Family education, Balance training, Stair training, Dry Needling, Spinal mobilization, Vestibular training, DME instructions, Cryotherapy, Moist heat, and Biofeedback  PLAN FOR NEXT SESSION: Hold chart open x 30 days; if pt does not return, plan to close/discharge chart at that time.  Lonia Blood, PT 01/29/23 9:24 AM Phone: [phone] Fax: [phone]  [facility] at [facility] [address] [place], [address] [place], [place] [address] Phone # [phone] Fax # [phone]

## 2023-02-04 ENCOUNTER — Ambulatory Visit: Payer: Medicare PPO | Attending: Cardiovascular Disease | Admitting: Cardiovascular Disease

## 2023-02-04 ENCOUNTER — Encounter: Payer: Self-pay | Admitting: Cardiovascular Disease

## 2023-02-04 VITALS — BP 116/64 | Ht 67.0 in | Wt 148.2 lb

## 2023-02-04 DIAGNOSIS — I10 Essential (primary) hypertension: Secondary | ICD-10-CM

## 2023-02-04 DIAGNOSIS — E785 Hyperlipidemia, unspecified: Secondary | ICD-10-CM

## 2023-02-04 DIAGNOSIS — Z720 Tobacco use: Secondary | ICD-10-CM | POA: Diagnosis not present

## 2023-02-04 DIAGNOSIS — I7143 Infrarenal abdominal aortic aneurysm, without rupture: Secondary | ICD-10-CM

## 2023-02-04 DIAGNOSIS — I2510 Atherosclerotic heart disease of native coronary artery without angina pectoris: Secondary | ICD-10-CM

## 2023-02-04 DIAGNOSIS — I491 Atrial premature depolarization: Secondary | ICD-10-CM | POA: Diagnosis not present

## 2023-02-04 DIAGNOSIS — I739 Peripheral vascular disease, unspecified: Secondary | ICD-10-CM | POA: Diagnosis not present

## 2023-02-04 DIAGNOSIS — R0989 Other specified symptoms and signs involving the circulatory and respiratory systems: Secondary | ICD-10-CM | POA: Diagnosis not present

## 2023-02-04 NOTE — Assessment & Plan Note (Signed)
History of hyperlipidemia on high-dose statin therapy with lipid profile performed 07/20/2022 revealing total cholesterol of 160, LDL 81 HDL 61.  Apparently her PCP, Dr. [name], uptitrated her statin drug and is planning on rechecking her blood work next month.

## 2023-02-04 NOTE — Assessment & Plan Note (Signed)
History of PAD status post multiple right SFA interventions beginning back in 2013 and then again in 2015 and 16.  She has got several stents in her mid right SFA.  Her left angiogram suggested three-vessel runoff bilaterally.  She does complain of some pain in her right hip and thigh when she ambulates.  Most recent Dopplers performed 01/01/2023 revealed a right ABI of 0.69 and left of 0.59.  She did have a high-frequency signal in her right extrailiac artery suggesting at least moderate disease and a mildly elevated signal in her left external iliac artery.  At this point, she does not wish to pursue an invasive approach and we will continue to monitor her noninvasively on an annual basis.

## 2023-02-04 NOTE — Assessment & Plan Note (Signed)
Ongoing tobacco abuse of 5 cigarettes a day with recalcitrant to risk factor modification.

## 2023-02-04 NOTE — Assessment & Plan Note (Signed)
History of essential hypertension with blood pressure measured today at 116/64.  She is on hydralazine, hydrochlorothiazide, and losartan.

## 2023-02-04 NOTE — Assessment & Plan Note (Signed)
History of CAD status post LAD stenting by myself in 2006.  I catheterized her 08/01/2009 revealing a patent LAD stent and clinically occluded OM1 branch with high-grade mid RCA stenosis which I stented using a Taxus and drug-eluting stent.  She has had no further coronary intervention since and is asymptomatic.

## 2023-02-04 NOTE — Progress Notes (Signed)
02/04/2023 Sara Dominguez [facility]   1941-01-15  [id number]  Primary Physician [name], MD Primary Cardiologist: Runell Gess MD FACP, [place], [place], [place]  HPI:  Sara Dominguez is a 82 y.o.    thin-appearing, married Caucasian female, mother of 2, grandmother to 3 grandchildren who I last saw in the office 02/09/2022.Marland Kitchen  She is accompanied by her husband [name] who is also a patient of mine.  She has a history of CAD status post LAD stenting by myself in 2006, as well as right SFA stenting several months thereafter. She just stopped smoking in July of last year. I catheterized her August 05, 2009, revealing a patent LAD stent and a chronically occluded OM branch with high-grade mid RCA lesion which I stented with a Taxus Ion drug-eluting stent. At that time, I demonstrated a small abdominal aortic aneurysm measuring 2.3 x 2.4 cm. In addition, she developed recurrent lower extremity pain with Dopplers that showed a decrease in the right ABI from 0.99 to 0.75 with a high frequency signal in her mid right SFA. I angiogram'd her Jul 05, 2011, revealing proximal right SFA lesion along with in-stent restenosis. I revascularized her percutaneously which resulted in marked improvement in her ability to ambulate and in her Dopplers. She did develop arm pain while at [facility] in July of last year and was cath'd at [facility] by Dr. Ellis Parents radially who placed 2 drug-eluting stents in the right coronary artery.    Because of recurrent claudication and abnormal Doppler studies I re-angiogramed to her 05/14/13 revealing high-grade disease in the mid right SFA between 2 previously placed stents. I restarted her with a 6 mm x 100 mm long Viabahn  covered stent with excellent angiographic and clinical result. Her follow-up lower extremity arterial Doppler studies performed 12/04/13 revealed ABIs of 1 bilaterally with a widely patent stent. Since I saw her last she is complaining of some right lateral  thigh pain and tingling in her right foot as well as dysesthesia in both upper extremities. She does have a palpable pedal pulse on the right side. I performed a lower extremity ultrasound on her 09/27/14 revealing a high-frequency signal in the mid right SFA with a decline in her right ABI from 1.0 -.79. I angiogramed her 11/04/14 revealing a 95% stenosis of the leading edge of the previously placed covered stent which I restented using a 6 mm x 25 mm long Viabahn covered stent recent lower extremity arterial Doppler studies performed 07/25/16 revealed widely patent stents in her right SFA with ABIs of approximately 1 bilaterally. She denies claudication. In addition, she denies chest pain or shortness of breath. She did have endoscopy performed back in April because of anemia and a positive occult fecal blood test and this demonstrated a gastric ulcer. Her antiplatelet locations where held for a short period of time.   She was admitted to the hospital with of breath and lower extremity weakness.  2D echo was essentially normal.  Her hemoglobin was in the low 7 range and she was transfused 2 units of packed red blood cells which improved her shortness of breath and claudication.  Dopplers did however show a decline in her right ABI down to the .79 range similar to prior Dopplers when she had restenosed on that side although she currently denies claudication.   Since I saw her a year ago she is remained stable.  She denies chest pain or shortness of breath or claudication.  She  did have Doppler studies performed 01/01/2023 revealing a right ABI of 0.69, and a left ABI of 0.59 with a patent right SFA stent.  She did have a significant lesion in her right extrailiac artery and has complained of some right hip and thigh discomfort consistent with claudication.  She had a monitor performed because of "an irregular heartbeat" heard by her PCP that showed PACs.  I did refer her to Dr. Ladona Ridgel who thought that she was  asymptomatic and required no further treatment.   Current Meds  Medication Sig   acetaminophen (TYLENOL) 500 MG tablet Take 500-1,000 mg by mouth every 6 (six) hours as needed for mild pain or headache.   alendronate (FOSAMAX) 70 MG tablet Take 70 mg by mouth every Wednesday. Take with a full glass of water on an empty stomach.   atorvastatin (LIPITOR) 80 MG tablet TAKE 1 TABLET EVERY DAY   Cholecalciferol (VITAMIN D-3) 25 MCG (1000 UT) CAPS Take 2,000 Units by mouth daily.   clopidogrel (PLAVIX) 75 MG tablet TAKE 1 TABLET EVERY DAY   cyanocobalamin (VITAMIN B12) 1000 MCG tablet Take 1,000 mcg by mouth daily.   DULoxetine (CYMBALTA) 30 MG capsule TAKE 1 CAPSULE BY MOUTH EVERY DAY   ferrous sulfate 325 (65 FE) MG tablet Take 325 mg by mouth every Wednesday.    gabapentin (NEURONTIN) 100 MG capsule Take 3 capsules (300 mg total) by mouth 3 (three) times daily.   hydrochlorothiazide (HYDRODIURIL) 12.5 MG tablet Take 12.5 mg by mouth daily.   losartan (COZAAR) 100 MG tablet Take 1 tablet (100 mg total) by mouth daily.   MAGNESIUM PO Take 1 tablet by mouth daily in the afternoon.   Omega-3 Fatty Acids (FISH OIL) 1000 MG CAPS Take 1,000 mg by mouth daily.    pantoprazole (PROTONIX) 40 MG tablet Take 40 mg by mouth daily.   vitamin B-12 (CYANOCOBALAMIN) 250 MCG tablet Take 250 mcg by mouth daily.     No Known Allergies  Social History   Socioeconomic History   Marital status: Married    Spouse name: Not on file   Number of children: Not on file   Years of education: Not on file   Highest education level: Not on file  Occupational History   Not on file  Tobacco Use   Smoking status: Every Day    Current packs/day: 0.00    Average packs/day: 1 pack/day for 50.0 years (50.0 ttl pk-yrs)    Types: Cigarettes    Start date: 07/13/1960    Last attempt to quit: 07/14/2010    Years since quitting: 12.5   Smokeless tobacco: Never   Tobacco comments:    Resumed smoking 8 months ago, smokes  approximately 7 cigarettes daily.. Counseling given    02/09/2022 Patient smokes 3 daily  Substance and Sexual Activity   Alcohol use: Yes    Alcohol/week: 3.0 standard drinks of alcohol    Types: 3 Glasses of wine per week    Comment: social   Drug use: No   Sexual activity: Not Currently  Other Topics Concern   Not on file  Social History Narrative   Not on file   Social Drivers of Health   Financial Resource Strain: Low Risk  (10/13/2021)   Received from [facility], [facility]   Overall Financial Resource Strain (CARDIA)    Difficulty of Paying Living Expenses: Not hard at all  Food Insecurity: No Food Insecurity (10/13/2021)   [facility], [facility]  Hunger Vital Sign    Worried About Running Out of Food in the Last Year: Never true    Ran Out of Food in the Last Year: Never true  Transportation Needs: No Transportation Needs (10/13/2021)   Received from [facility], [facility]   [facility] - Transportation    Lack of Transportation (Medical): No    Lack of Transportation (Non-Medical): No  Physical Activity: Inactive (10/13/2021)   Received from [facility], [facility]   Exercise Vital Sign    Days of Exercise per Week: 0 days    Minutes of Exercise per Session: 0 min  Stress: No Stress Concern Present (10/13/2021)   Received from [facility], [facility] - Occupational Stress Questionnaire    Feeling of Stress : Not at all  Social Connections: Moderately Isolated (10/13/2021)   Received from [facility], [facility]   Social Connection and Isolation Panel [NHANES]    Frequency of Communication with Friends and Family: Once a week    Frequency of Social Gatherings with Friends and Family: Once a week    Attends Religious Services: More than 4 times per year    Active Member of [employer] or Organizations: No    Attends Banker Meetings: Never    Marital Status: Married  Careers information officer Violence: Not At Risk (10/13/2021)   Received from [facility], [facility]   Humiliation, Afraid, Rape, and Kick questionnaire    Fear of Current or Ex-Partner: No    Emotionally Abused: No    Physically Abused: No    Sexually Abused: No     Review of Systems: General: negative for chills, fever, night sweats or weight changes.  Cardiovascular: negative for chest pain, dyspnea on exertion, edema, orthopnea, palpitations, paroxysmal nocturnal dyspnea or shortness of breath Dermatological: negative for rash Respiratory: negative for cough or wheezing Urologic: negative for hematuria Abdominal: negative for nausea, vomiting, diarrhea, bright red blood per rectum, melena, or hematemesis Neurologic: negative for visual changes, syncope, or dizziness All other systems reviewed and are otherwise negative except as noted above.    Blood pressure 116/64, height 5\' 7"  (1.702 m), weight 148 lb 3.2 oz (67.2 kg), SpO2 95%.  General appearance: alert and no distress Neck: no adenopathy, no JVD, supple, symmetrical, trachea midline, thyroid not enlarged, symmetric, no tenderness/mass/nodules, and left carotid bruit Lungs: clear to auscultation bilaterally Heart: regular rate and rhythm, S1, S2 normal, no murmur, click, rub or gallop Extremities: extremities normal, atraumatic, no cyanosis or edema Pulses: Decreased pedal pulses Skin: Skin color, texture, turgor normal. No rashes or lesions Neurologic: Grossly normal  EKG EKG Interpretation Date/Time:  Monday February 04 2023 08:48:48 EST Ventricular Rate:  60 PR Interval:  178 QRS Duration:  142 QT Interval:  476 QTC Calculation: 476 R Axis:   65  Text Interpretation: Normal sinus rhythm Right bundle branch block When compared with ECG of 04-Feb-2023 08:44, Premature atrial complexes are no longer Present Vent. rate has decreased BY  30 BPM Right bundle branch block is now Present Confirmed by Nanetta Batty [phone]) on  02/04/2023 8:56:02 AM    ASSESSMENT AND PLAN:   Claudication in peripheral vascular disease, life style limiting History of PAD status post multiple right SFA interventions beginning back in 2013 and then again in 2015 and 16.  She has got several stents in her mid right SFA.  Her left angiogram suggested three-vessel runoff bilaterally.  She does complain of some pain  in her right hip and thigh when she ambulates.  Most recent Dopplers performed 01/01/2023 revealed a right ABI of 0.69 and left of 0.59.  She did have a high-frequency signal in her right extrailiac artery suggesting at least moderate disease and a mildly elevated signal in her left external iliac artery.  At this point, she does not wish to pursue an invasive approach and we will continue to monitor her noninvasively on an annual basis.  CAD- LAD stent in 2006, chronically occluded OM branch with hx. of Stent to RCA  History of CAD status post LAD stenting by myself in 2006.  I catheterized her 08/01/2009 revealing a patent LAD stent and clinically occluded OM1 branch with high-grade mid RCA stenosis which I stented using a Taxus and drug-eluting stent.  She has had no further coronary intervention since and is asymptomatic.  Hyperlipidemia with target LDL less than 70 History of hyperlipidemia on high-dose statin therapy with lipid profile performed 07/20/2022 revealing total cholesterol of 160, LDL 81 HDL 61.  Apparently her PCP, Dr. [name], uptitrated her statin drug and is planning on rechecking her blood work next month.  Essential hypertension History of essential hypertension with blood pressure measured today at 116/64.  She is on hydralazine, hydrochlorothiazide, and losartan.  AAA (abdominal aortic aneurysm), small, history of 2.3 X 2.4 Abdominal aorta measured 3.9 cm at her recent duplex study performed 01/01/2023.  This will be rechecked on an annual basis.  PAC (premature atrial contraction) History of PACs seen on  a cardiac monitor.  I did refer her to Dr. Ladona Ridgel.  She was asymptomatic and he recommended not to pursue medical therapy at this time.  Tobacco abuse Ongoing tobacco abuse of 5 cigarettes a day with recalcitrant to risk factor modification.     Runell Gess MD FACP,FACC,FAHA, [facility] 02/04/2023 9:24 AM

## 2023-02-04 NOTE — Assessment & Plan Note (Signed)
Abdominal aorta measured 3.9 cm at her recent duplex study performed 01/01/2023.  This will be rechecked on an annual basis.

## 2023-02-04 NOTE — Patient Instructions (Signed)
Medication Instructions:  Your physician recommends that you continue on your current medications as directed. Please refer to the Current Medication list given to you today.  *If you need a refill on your cardiac medications before your next appointment, please call your pharmacy*   Testing/Procedures: Your physician has requested that you have a carotid duplex. This test is an ultrasound of the carotid arteries in your neck. It looks at blood flow through these arteries that supply the brain with blood. Allow one hour for this exam. There are no restrictions or special instructions. This will take place at [address], [address].  Please note: We ask at that you not bring children with you during ultrasound (echo/ vascular) testing. Due to room size and safety concerns, children are not allowed in the ultrasound rooms during exams. Our front office staff cannot provide observation of children in our lobby area while testing is being conducted. An adult accompanying a patient to their appointment will only be allowed in the ultrasound room at the discretion of the ultrasound technician under special circumstances. We apologize for any inconvenience.  Your physician has requested that you have a lower extremity arterial duplex. During this test, ultrasound is used to evaluate arterial blood flow in the legs. Allow one hour for this exam. There are no restrictions or special instructions. This will take place at [address], [address]. **To do in November 2025**  Please note: We ask at that you not bring children with you during ultrasound (echo/ vascular) testing. Due to room size and safety concerns, children are not allowed in the ultrasound rooms during exams. Our front office staff cannot provide observation of children in our lobby area while testing is being conducted. An adult accompanying a patient to their appointment will only be allowed in the ultrasound room at the discretion  of the ultrasound technician under special circumstances. We apologize for any inconvenience.  Your physician has requested that you have an ankle brachial index (ABI). During this test an ultrasound and blood pressure cuff are used to evaluate the arteries that supply the arms and legs with blood. Allow thirty minutes for this exam. There are no restrictions or special instructions. This will take place at [address] [facility] [address] [facility], [address]. **To do in November 2025**     Please note: We ask at that you not bring children with you during ultrasound (echo/ vascular) testing. Due to room size and safety concerns, children are not allowed in the ultrasound rooms during exams. Our front office staff cannot provide observation of children in our lobby area while testing is being conducted. An adult accompanying a patient to their appointment will only be allowed in the ultrasound room at the discretion of the ultrasound technician under special circumstances. We apologize for any inconvenience.    Follow-Up: At [facility], you and your health needs are our priority.  As part of our continuing mission to provide you with exceptional heart care, we have created designated Provider Care Teams.  These Care Teams include your primary Cardiologist (physician) and Advanced Practice Providers (APPs -  Physician Assistants and Nurse Practitioners) who all work together to provide you with the care you need, when you need it.  We recommend signing up for the patient portal called "MyChart".  Sign up information is provided on this After Visit Summary.  MyChart is used to connect with patients for Virtual Visits (Telemedicine).  Patients are able to view lab/test results, encounter notes, upcoming  appointments, etc.  Non-urgent messages can be sent to your provider as well.   To learn more about what you can do with MyChart, go to [email or web address].    Your next appointment:   12 month(s)  Provider:    Nanetta Batty, MD

## 2023-02-04 NOTE — Assessment & Plan Note (Signed)
History of PACs seen on a cardiac monitor.  I did refer her to Dr. Ladona Ridgel.  She was asymptomatic and he recommended not to pursue medical therapy at this time.

## 2023-02-19 DIAGNOSIS — M5417 Radiculopathy, lumbosacral region: Secondary | ICD-10-CM | POA: Diagnosis not present

## 2023-02-19 DIAGNOSIS — Z720 Tobacco use: Secondary | ICD-10-CM | POA: Diagnosis not present

## 2023-02-19 DIAGNOSIS — M859 Disorder of bone density and structure, unspecified: Secondary | ICD-10-CM | POA: Diagnosis not present

## 2023-02-19 DIAGNOSIS — E7800 Pure hypercholesterolemia, unspecified: Secondary | ICD-10-CM | POA: Diagnosis not present

## 2023-02-19 DIAGNOSIS — I10 Essential (primary) hypertension: Secondary | ICD-10-CM | POA: Diagnosis not present

## 2023-02-19 DIAGNOSIS — K219 Gastro-esophageal reflux disease without esophagitis: Secondary | ICD-10-CM | POA: Diagnosis not present

## 2023-02-19 DIAGNOSIS — I714 Abdominal aortic aneurysm, without rupture, unspecified: Secondary | ICD-10-CM | POA: Diagnosis not present

## 2023-02-19 DIAGNOSIS — N390 Urinary tract infection, site not specified: Secondary | ICD-10-CM | POA: Diagnosis not present

## 2023-02-21 ENCOUNTER — Ambulatory Visit (HOSPITAL_COMMUNITY)
Admission: RE | Admit: 2023-02-21 | Discharge: 2023-02-21 | Disposition: A | Discharge status: Home / Self Care | Payer: Medicare PPO | Source: Ambulatory Visit | Attending: Cardiovascular Disease | Admitting: Cardiovascular Disease

## 2023-02-21 DIAGNOSIS — R0989 Other specified symptoms and signs involving the circulatory and respiratory systems: Secondary | ICD-10-CM | POA: Diagnosis not present

## 2023-02-23 ENCOUNTER — Other Ambulatory Visit: Payer: Self-pay | Admitting: Cardiovascular Disease

## 2023-03-04 ENCOUNTER — Other Ambulatory Visit (HOSPITAL_COMMUNITY): Payer: Self-pay

## 2023-03-04 DIAGNOSIS — I6523 Occlusion and stenosis of bilateral carotid arteries: Secondary | ICD-10-CM

## 2023-03-10 DIAGNOSIS — R300 Dysuria: Secondary | ICD-10-CM | POA: Diagnosis not present

## 2023-03-10 DIAGNOSIS — N3000 Acute cystitis without hematuria: Secondary | ICD-10-CM | POA: Diagnosis not present

## 2023-04-11 DIAGNOSIS — R300 Dysuria: Secondary | ICD-10-CM | POA: Diagnosis not present

## 2023-04-11 DIAGNOSIS — R350 Frequency of micturition: Secondary | ICD-10-CM | POA: Diagnosis not present

## 2023-04-11 DIAGNOSIS — N3946 Mixed incontinence: Secondary | ICD-10-CM | POA: Diagnosis not present

## 2023-04-16 DIAGNOSIS — H34832 Tributary (branch) retinal vein occlusion, left eye, with macular edema: Secondary | ICD-10-CM | POA: Diagnosis not present

## 2023-04-16 DIAGNOSIS — Z8719 Personal history of other diseases of the digestive system: Secondary | ICD-10-CM | POA: Diagnosis not present

## 2023-04-16 DIAGNOSIS — K529 Noninfective gastroenteritis and colitis, unspecified: Secondary | ICD-10-CM | POA: Diagnosis not present

## 2023-04-16 DIAGNOSIS — H52223 Regular astigmatism, bilateral: Secondary | ICD-10-CM | POA: Diagnosis not present

## 2023-04-16 DIAGNOSIS — H35033 Hypertensive retinopathy, bilateral: Secondary | ICD-10-CM | POA: Diagnosis not present

## 2023-04-16 DIAGNOSIS — H5203 Hypermetropia, bilateral: Secondary | ICD-10-CM | POA: Diagnosis not present

## 2023-04-16 DIAGNOSIS — H524 Presbyopia: Secondary | ICD-10-CM | POA: Diagnosis not present

## 2023-04-17 ENCOUNTER — Encounter (INDEPENDENT_AMBULATORY_CARE_PROVIDER_SITE_OTHER): Payer: Medicare PPO | Admitting: Ophthalmology

## 2023-04-17 DIAGNOSIS — H43813 Vitreous degeneration, bilateral: Secondary | ICD-10-CM

## 2023-04-17 DIAGNOSIS — H348322 Tributary (branch) retinal vein occlusion, left eye, stable: Secondary | ICD-10-CM | POA: Diagnosis not present

## 2023-04-17 DIAGNOSIS — H35033 Hypertensive retinopathy, bilateral: Secondary | ICD-10-CM

## 2023-04-17 DIAGNOSIS — I10 Essential (primary) hypertension: Secondary | ICD-10-CM

## 2023-04-24 ENCOUNTER — Other Ambulatory Visit: Payer: Self-pay | Admitting: Cardiovascular Disease

## 2023-04-25 DIAGNOSIS — L821 Other seborrheic keratosis: Secondary | ICD-10-CM | POA: Diagnosis not present

## 2023-04-25 DIAGNOSIS — B079 Viral wart, unspecified: Secondary | ICD-10-CM | POA: Diagnosis not present

## 2023-04-25 DIAGNOSIS — L820 Inflamed seborrheic keratosis: Secondary | ICD-10-CM | POA: Diagnosis not present

## 2023-04-25 DIAGNOSIS — L814 Other melanin hyperpigmentation: Secondary | ICD-10-CM | POA: Diagnosis not present

## 2023-04-25 DIAGNOSIS — D485 Neoplasm of uncertain behavior of skin: Secondary | ICD-10-CM | POA: Diagnosis not present

## 2023-04-25 DIAGNOSIS — Z85828 Personal history of other malignant neoplasm of skin: Secondary | ICD-10-CM | POA: Diagnosis not present

## 2023-04-25 DIAGNOSIS — D692 Other nonthrombocytopenic purpura: Secondary | ICD-10-CM | POA: Diagnosis not present

## 2023-05-09 DIAGNOSIS — R350 Frequency of micturition: Secondary | ICD-10-CM | POA: Diagnosis not present

## 2023-05-09 DIAGNOSIS — N3946 Mixed incontinence: Secondary | ICD-10-CM | POA: Diagnosis not present

## 2023-05-16 DIAGNOSIS — R350 Frequency of micturition: Secondary | ICD-10-CM | POA: Diagnosis not present

## 2023-05-16 DIAGNOSIS — R3915 Urgency of urination: Secondary | ICD-10-CM | POA: Diagnosis not present

## 2023-05-23 DIAGNOSIS — R350 Frequency of micturition: Secondary | ICD-10-CM | POA: Diagnosis not present

## 2023-05-23 DIAGNOSIS — R3915 Urgency of urination: Secondary | ICD-10-CM | POA: Diagnosis not present

## 2023-05-28 DIAGNOSIS — N390 Urinary tract infection, site not specified: Secondary | ICD-10-CM | POA: Diagnosis not present

## 2023-05-30 DIAGNOSIS — R350 Frequency of micturition: Secondary | ICD-10-CM | POA: Diagnosis not present

## 2023-05-30 DIAGNOSIS — R3915 Urgency of urination: Secondary | ICD-10-CM | POA: Diagnosis not present

## 2023-06-04 DIAGNOSIS — Z8719 Personal history of other diseases of the digestive system: Secondary | ICD-10-CM | POA: Diagnosis not present

## 2023-06-04 DIAGNOSIS — K219 Gastro-esophageal reflux disease without esophagitis: Secondary | ICD-10-CM | POA: Diagnosis not present

## 2023-06-04 DIAGNOSIS — K529 Noninfective gastroenteritis and colitis, unspecified: Secondary | ICD-10-CM | POA: Diagnosis not present

## 2023-06-06 DIAGNOSIS — R350 Frequency of micturition: Secondary | ICD-10-CM | POA: Diagnosis not present

## 2023-06-13 DIAGNOSIS — N3946 Mixed incontinence: Secondary | ICD-10-CM | POA: Diagnosis not present

## 2023-06-13 DIAGNOSIS — R350 Frequency of micturition: Secondary | ICD-10-CM | POA: Diagnosis not present

## 2023-06-20 DIAGNOSIS — R350 Frequency of micturition: Secondary | ICD-10-CM | POA: Diagnosis not present

## 2023-06-20 DIAGNOSIS — R3915 Urgency of urination: Secondary | ICD-10-CM | POA: Diagnosis not present

## 2023-06-21 DIAGNOSIS — M47812 Spondylosis without myelopathy or radiculopathy, cervical region: Secondary | ICD-10-CM | POA: Diagnosis not present

## 2023-06-21 DIAGNOSIS — M47816 Spondylosis without myelopathy or radiculopathy, lumbar region: Secondary | ICD-10-CM | POA: Diagnosis not present

## 2023-06-27 DIAGNOSIS — R350 Frequency of micturition: Secondary | ICD-10-CM | POA: Diagnosis not present

## 2023-06-27 DIAGNOSIS — R3915 Urgency of urination: Secondary | ICD-10-CM | POA: Diagnosis not present

## 2023-07-04 DIAGNOSIS — R350 Frequency of micturition: Secondary | ICD-10-CM | POA: Diagnosis not present

## 2023-07-04 DIAGNOSIS — R3915 Urgency of urination: Secondary | ICD-10-CM | POA: Diagnosis not present

## 2023-07-10 ENCOUNTER — Encounter (INDEPENDENT_AMBULATORY_CARE_PROVIDER_SITE_OTHER): Admitting: Ophthalmology

## 2023-07-10 DIAGNOSIS — H348322 Tributary (branch) retinal vein occlusion, left eye, stable: Secondary | ICD-10-CM

## 2023-07-10 DIAGNOSIS — H35033 Hypertensive retinopathy, bilateral: Secondary | ICD-10-CM | POA: Diagnosis not present

## 2023-07-10 DIAGNOSIS — H43813 Vitreous degeneration, bilateral: Secondary | ICD-10-CM | POA: Diagnosis not present

## 2023-07-10 DIAGNOSIS — I10 Essential (primary) hypertension: Secondary | ICD-10-CM

## 2023-07-11 DIAGNOSIS — R350 Frequency of micturition: Secondary | ICD-10-CM | POA: Diagnosis not present

## 2023-07-11 DIAGNOSIS — R3915 Urgency of urination: Secondary | ICD-10-CM | POA: Diagnosis not present

## 2023-07-13 ENCOUNTER — Emergency Department (HOSPITAL_BASED_OUTPATIENT_CLINIC_OR_DEPARTMENT_OTHER): Admission: EM | Admit: 2023-07-13 | Discharge: 2023-07-13 | Disposition: A | Discharge status: Home / Self Care

## 2023-07-13 ENCOUNTER — Emergency Department (HOSPITAL_BASED_OUTPATIENT_CLINIC_OR_DEPARTMENT_OTHER)

## 2023-07-13 ENCOUNTER — Other Ambulatory Visit: Payer: Self-pay

## 2023-07-13 ENCOUNTER — Encounter (HOSPITAL_BASED_OUTPATIENT_CLINIC_OR_DEPARTMENT_OTHER): Payer: Self-pay | Admitting: Emergency Medicine

## 2023-07-13 DIAGNOSIS — R000 Tachycardia, unspecified: Secondary | ICD-10-CM | POA: Diagnosis not present

## 2023-07-13 DIAGNOSIS — Z79899 Other long term (current) drug therapy: Secondary | ICD-10-CM | POA: Diagnosis not present

## 2023-07-13 DIAGNOSIS — K529 Noninfective gastroenteritis and colitis, unspecified: Secondary | ICD-10-CM | POA: Diagnosis not present

## 2023-07-13 DIAGNOSIS — I10 Essential (primary) hypertension: Secondary | ICD-10-CM | POA: Insufficient documentation

## 2023-07-13 DIAGNOSIS — N3001 Acute cystitis with hematuria: Secondary | ICD-10-CM | POA: Diagnosis not present

## 2023-07-13 DIAGNOSIS — I2510 Atherosclerotic heart disease of native coronary artery without angina pectoris: Secondary | ICD-10-CM | POA: Diagnosis not present

## 2023-07-13 DIAGNOSIS — Z7901 Long term (current) use of anticoagulants: Secondary | ICD-10-CM | POA: Insufficient documentation

## 2023-07-13 DIAGNOSIS — R1031 Right lower quadrant pain: Secondary | ICD-10-CM | POA: Diagnosis not present

## 2023-07-13 DIAGNOSIS — I7143 Infrarenal abdominal aortic aneurysm, without rupture: Secondary | ICD-10-CM | POA: Diagnosis not present

## 2023-07-13 DIAGNOSIS — R1032 Left lower quadrant pain: Secondary | ICD-10-CM | POA: Diagnosis not present

## 2023-07-13 DIAGNOSIS — N200 Calculus of kidney: Secondary | ICD-10-CM | POA: Diagnosis not present

## 2023-07-13 DIAGNOSIS — N281 Cyst of kidney, acquired: Secondary | ICD-10-CM | POA: Diagnosis not present

## 2023-07-13 LAB — CBC WITH DIFFERENTIAL/PLATELET
Abs Immature Granulocytes: 0.03 10*3/uL (ref 0.00–0.07)
Basophils Absolute: 0 10*3/uL (ref 0.0–0.1)
Basophils Relative: 0 %
Eosinophils Absolute: 0.1 10*3/uL (ref 0.0–0.5)
Eosinophils Relative: 1 %
HCT: 37.6 % (ref 36.0–46.0)
Hemoglobin: 12.3 g/dL (ref 12.0–15.0)
Immature Granulocytes: 0 %
Lymphocytes Relative: 14 %
Lymphs Abs: 1.5 10*3/uL (ref 0.7–4.0)
MCH: 31.8 pg (ref 26.0–34.0)
MCHC: 32.7 g/dL (ref 30.0–36.0)
MCV: 97.2 fL (ref 80.0–100.0)
Monocytes Absolute: 0.6 10*3/uL (ref 0.1–1.0)
Monocytes Relative: 6 %
Neutro Abs: 8.4 10*3/uL — ABNORMAL HIGH (ref 1.7–7.7)
Neutrophils Relative %: 79 %
Platelets: 196 10*3/uL (ref 150–400)
RBC: 3.87 MIL/uL (ref 3.87–5.11)
RDW: 13.8 % (ref 11.5–15.5)
WBC: 10.6 10*3/uL — ABNORMAL HIGH (ref 4.0–10.5)
nRBC: 0 % (ref 0.0–0.2)

## 2023-07-13 LAB — URINALYSIS, ROUTINE W REFLEX MICROSCOPIC
Bacteria, UA: NONE SEEN
Bilirubin Urine: NEGATIVE
Glucose, UA: NEGATIVE mg/dL
Ketones, ur: NEGATIVE mg/dL
Nitrite: POSITIVE — AB
Protein, ur: NEGATIVE mg/dL
Specific Gravity, Urine: 1.04 — ABNORMAL HIGH (ref 1.005–1.030)
WBC, UA: >50 WBC/hpf (ref 0–5)
pH: 7 (ref 5.0–8.0)

## 2023-07-13 LAB — COMPREHENSIVE METABOLIC PANEL WITH GFR
ALT: 9 U/L (ref 0–44)
AST: 18 U/L (ref 15–41)
Albumin: 3.7 g/dL (ref 3.5–5.0)
Alkaline Phosphatase: 81 U/L (ref 38–126)
Anion gap: 13 (ref 5–15)
BUN: 12 mg/dL (ref 8–23)
CO2: 26 mmol/L (ref 22–32)
Calcium: 8.1 mg/dL — ABNORMAL LOW (ref 8.9–10.3)
Chloride: 102 mmol/L (ref 98–111)
Creatinine, Ser: 0.76 mg/dL (ref 0.44–1.00)
GFR, Estimated: >60 mL/min (ref >60)
Glucose, Bld: 97 mg/dL (ref 70–99)
Potassium: 3 mmol/L — ABNORMAL LOW (ref 3.5–5.1)
Sodium: 141 mmol/L (ref 135–145)
Total Bilirubin: 0.7 mg/dL (ref 0.0–1.2)
Total Protein: 6.2 g/dL — ABNORMAL LOW (ref 6.5–8.1)

## 2023-07-13 LAB — LIPASE, BLOOD: Lipase: 26 U/L (ref 11–51)

## 2023-07-13 MED ORDER — CIPROFLOXACIN HCL 500 MG PO TABS: 500.0000 mg | ORAL_TABLET | Freq: Two times a day (BID) | ORAL | 0 refills | Status: DC

## 2023-07-13 MED ORDER — CIPROFLOXACIN HCL 500 MG PO TABS
500.0000 mg | ORAL_TABLET | Freq: Once | ORAL | Status: AC
  Administered 2023-07-13: 500 mg via ORAL
  Filled 2023-07-13: qty 1

## 2023-07-13 MED ORDER — METRONIDAZOLE 500 MG PO TABS
500.0000 mg | ORAL_TABLET | Freq: Once | ORAL | Status: AC
  Administered 2023-07-13: 500 mg via ORAL
  Filled 2023-07-13: qty 1

## 2023-07-13 MED ORDER — ACETAMINOPHEN 500 MG PO TABS
1000.0000 mg | ORAL_TABLET | Freq: Once | ORAL | Status: AC
  Administered 2023-07-13: 1000 mg via ORAL
  Filled 2023-07-13: qty 2

## 2023-07-13 MED ORDER — POTASSIUM CHLORIDE CRYS ER 20 MEQ PO TBCR
40.0000 meq | EXTENDED_RELEASE_TABLET | Freq: Once | ORAL | Status: AC
  Administered 2023-07-13: 40 meq via ORAL
  Filled 2023-07-13: qty 2

## 2023-07-13 MED ORDER — METRONIDAZOLE 500 MG PO TABS: 500.0000 mg | ORAL_TABLET | Freq: Two times a day (BID) | ORAL | 0 refills | Status: DC

## 2023-07-13 MED ORDER — IOHEXOL 300 MG/ML  SOLN
100.0000 mL | Freq: Once | INTRAMUSCULAR | Status: AC | PRN
  Administered 2023-07-13: 80 mL via INTRAVENOUS

## 2023-07-13 MED ORDER — LACTATED RINGERS IV BOLUS
500.0000 mL | Freq: Once | INTRAVENOUS | Status: AC
  Administered 2023-07-13: 500 mL via INTRAVENOUS

## 2023-07-13 NOTE — Discharge Instructions (Signed)
 Please take 1000 mg of Tylenol  every 6 hours as needed for pain.  Please take the antibiotic as prescribed and follow-up with your gastroenterologist.  I did call them earlier and they will try to get you an appointment next week.  If you have worsening symptoms it may not be a bad idea to go to [facility] or [facility] as he may require admission at that point.  Return to the emergency department for worsening symptoms.

## 2023-07-13 NOTE — ED Provider Notes (Signed)
 [place] EMERGENCY DEPARTMENT AT [facility] Provider Note   CSN: [id number] Arrival date & time: 07/13/23  1154     History  No chief complaint on file.   Sara Dominguez is a 83 y.o. female.  83 year old female with past medical history of coronary artery disease, hypertension, and peripheral vascular disease presenting to the emergency department today with abdominal pain.  The patient states she has been having lower abdominal pain now over the past 2 weeks.  She states that this has gradually worsened.  She initially thought this may be due to urinary tract infection but she states that this feels different than previous UTIs.  She states that she did have her urine checked as an outpatient and this was apparently positive but due to her symptoms she was sent to the ER for further evaluation.  She denies any blood in her stool or dark stools.  States that she did have a few episodes of loose stools a few days ago.  She has not had any bowel movements now in the past 2 to 3 days.        Home Medications Prior to Admission medications   Medication Sig Start Date End Date Taking? Authorizing Provider  ciprofloxacin (CIPRO) 500 MG tablet Take 1 tablet (500 mg total) by mouth every 12 (twelve) hours. 07/13/23  Yes Carin Charleston, MD  metroNIDAZOLE (FLAGYL) 500 MG tablet Take 1 tablet (500 mg total) by mouth 2 (two) times daily. 07/13/23  Yes Carin Charleston, MD  acetaminophen  (TYLENOL ) 500 MG tablet Take 500-1,000 mg by mouth every 6 (six) hours as needed for mild pain or headache.    [provider]  alendronate (FOSAMAX) 70 MG tablet Take 70 mg by mouth every Wednesday. Take with a full glass of water on an empty stomach.    [provider]  atorvastatin  (LIPITOR) 80 MG tablet TAKE 1 TABLET EVERY DAY 02/25/23   Avanell Leigh, MD  Cholecalciferol  (VITAMIN D-3) 25 MCG (1000 UT) CAPS Take 2,000 Units by mouth daily.    [provider]  clopidogrel   (PLAVIX ) 75 MG tablet TAKE 1 TABLET EVERY DAY 04/24/23   Avanell Leigh, MD  cyanocobalamin  (VITAMIN B12) 1000 MCG tablet Take 1,000 mcg by mouth daily.    [provider]  DULoxetine  (CYMBALTA ) 30 MG capsule TAKE 1 CAPSULE BY MOUTH EVERY DAY 01/21/23   Phebe Brasil, MD  ferrous sulfate  325 (65 FE) MG tablet Take 325 mg by mouth every Wednesday.     [provider]  gabapentin  (NEURONTIN ) 100 MG capsule Take 3 capsules (300 mg total) by mouth 3 (three) times daily. 11/09/22   Phebe Brasil, MD  hydrALAZINE  (APRESOLINE ) 10 MG tablet Take 10 mg by mouth 2 (two) times daily.    [provider]  hydrochlorothiazide (HYDRODIURIL) 12.5 MG tablet Take 12.5 mg by mouth daily.    [provider]  losartan  (COZAAR ) 100 MG tablet Take 1 tablet (100 mg total) by mouth daily. 05/06/15   Avanell Leigh, MD  MAGNESIUM  PO Take 1 tablet by mouth daily in the afternoon.    [provider]  Omega-3 Fatty Acids (FISH OIL) 1000 MG CAPS Take 1,000 mg by mouth daily.     [provider]  pantoprazole  (PROTONIX ) 40 MG tablet Take 40 mg by mouth daily. 07/06/19   [provider]  vitamin B-12 (CYANOCOBALAMIN ) 250 MCG tablet Take 250 mcg by mouth daily.    [provider]  Allergies    Patient has no known allergies.    Review of Systems   Review of Systems  Gastrointestinal:  Positive for abdominal pain.  All other systems reviewed and are negative.   Physical Exam Updated Vital Signs BP (!) 149/77   Pulse 100   Temp 97.7 F (36.5 C)   Resp (!) 26   Wt 65.3 kg   SpO2 93%   BMI 22.55 kg/m  Physical Exam Vitals and nursing note reviewed.   Gen: NAD Eyes: PERRL, EOMI HEENT: no oropharyngeal swelling Neck: trachea midline Resp: clear to auscultation bilaterally Card: RRR, no murmurs, rubs, or gallops Abd: The patient is tender over the periumbilical region left lower quadrant with no guarding or rebound Extremities: no calf  tenderness, no edema Vascular: 2+ radial pulses bilaterally, 2+ DP pulses bilaterally Skin: no rashes Psyc: acting appropriately   ED Results / Procedures / Treatments   Labs (all labs ordered are listed, but only abnormal results are displayed) Labs Reviewed  CBC WITH DIFFERENTIAL/PLATELET - Abnormal; Notable for the following components:      Result Value   WBC 10.6 (*)    Neutro Abs 8.4 (*)    All other components within normal limits  COMPREHENSIVE METABOLIC PANEL WITH GFR - Abnormal; Notable for the following components:   Potassium 3.0 (*)    Calcium  8.1 (*)    Total Protein 6.2 (*)    All other components within normal limits  LIPASE, BLOOD  URINALYSIS, ROUTINE W REFLEX MICROSCOPIC    EKG EKG Interpretation Date/Time:  Saturday Jul 13 2023 15:13:06 EDT Ventricular Rate:  105 PR Interval:  148 QRS Duration:  78 QT Interval:  355 QTC Calculation: 470 R Axis:   65  Text Interpretation: Sinus tachycardia with irregular rate Abnormal R-wave progression, early transition Confirmed by Abner Hoffman [phone]) on 07/13/2023 3:17:41 PM  Radiology CT ABDOMEN PELVIS W CONTRAST Result Date: 07/13/2023 CLINICAL DATA:  Left lower quadrant pain. Abdominal aortic aneurysm. EXAM: CT ABDOMEN AND PELVIS WITH CONTRAST TECHNIQUE: Multidetector CT imaging of the abdomen and pelvis was performed using the standard protocol following bolus administration of intravenous contrast. RADIATION DOSE REDUCTION: This exam was performed according to the departmental dose-optimization program which includes automated exposure control, adjustment of the mA and/or kV according to patient size and/or use of iterative reconstruction technique. CONTRAST:  80mL OMNIPAQUE IOHEXOL 300 MG/ML  SOLN COMPARISON:  05/11/2021 FINDINGS: Lower Chest: No acute findings. Hepatobiliary: No suspicious hepatic masses identified. Gallbladder is unremarkable. No evidence of biliary ductal dilatation. Pancreas:  No mass or  inflammatory changes. Spleen: Within normal limits in size and appearance. Adrenals/Urinary Tract: No suspicious masses identified. Stable small benign-appearing cyst in lower pole of left kidney (No followup imaging is recommended). Stable mild right renal parenchymal scarring. Multiple small less 5 mm renal calculi are again seen bilaterally. No evidence of ureteral calculi or hydronephrosis. Unremarkable unopacified urinary bladder. Stomach/Bowel: Moderate thickening is seen involving cecum ascending colon with mild pleura colonic soft tissue stranding. This is suspicious for colitis, with colon carcinoma considered less likely. Normal appendix visualized. No evidence of obstruction, inflammatory process or abnormal fluid collections. Vascular/Lymphatic: No pathologically enlarged lymph nodes. 3.6 cm infrarenal abdominal aortic aneurysm without significant change. Reproductive: Prior hysterectomy noted. Adnexal regions are unremarkable in appearance. Other:  None. Musculoskeletal:  No suspicious bone lesions identified. IMPRESSION: Moderate thickening of cecum and ascending colon suspicious for colitis, with colon carcinoma considered less likely. Consider continued follow-up by CT versus colonoscopy. Bilateral  nephrolithiasis. No evidence of ureteral calculi or hydronephrosis. Stable 3.6 cm infrarenal abdominal aortic aneurysm. Recommend follow-up ultrasound every 3 years. (Ref.: J Vasc Surg. 2018; 67:2-77 and J Am Coll Radiol 2013;10(10):789-794.) Electronically Signed   By: Marlyce Sine M.D.   On: 07/13/2023 14:48    Procedures Procedures    Medications Ordered in ED Medications  iohexol (OMNIPAQUE) 300 MG/ML solution 100 mL (80 mLs Intravenous Contrast Given 07/13/23 1414)  lactated ringers  bolus 500 mL (0 mLs Intravenous Stopped 07/13/23 1550)  potassium chloride  SA (KLOR-CON  M) CR tablet 40 mEq (40 mEq Oral Given 07/13/23 1556)  ciprofloxacin (CIPRO) tablet 500 mg (500 mg Oral Given 07/13/23 1556)   metroNIDAZOLE (FLAGYL) tablet 500 mg (500 mg Oral Given 07/13/23 1556)  acetaminophen  (TYLENOL ) tablet 1,000 mg (1,000 mg Oral Given 07/13/23 1556)    ED Course/ Medical Decision Making/ A&P                                 Medical Decision Making 83 year old female with past medical history of coronary artery disease, peripheral vascular disease presenting to the emergency department today with lower abdominal pain.  I will further evaluate the patient here with basic labs including LFTs and a lipase to further evaluate for hepatobiliary pathology or pancreatitis.  Will obtain a CT scan to evaluate for appendicitis, diverticulitis, colitis, obstruction, or other intra-abdominal pathology.  I will reevaluate for ultimate disposition.  The patient does have a mild leukocytosis.  Her CT scan does show findings consistent with colitis.  I did call and discussed the patient's case with Dr. Honey Lusty.  He recommended antibiotics and admission versus discharge depending on the patient's symptoms on reassessment.  After discussion with the patient she feels that her symptoms are tolerable and would like to try to go home.  She is given ciprofloxacin and Flagyl per Dr. Thornton Flock recommendation.  The patient will be discharged with return precautions.  Initially her heart rate was mildly elevated in the low 100s.  She was given a small fluid bolus and after this her heart rate improved to the 80s and 90s on reassessment.  She is discharged with return precautions.  Dr. Honey Lusty will help arrange outpatient follow-up.  Amount and/or Complexity of Data Reviewed Labs: ordered. Radiology: ordered.  Risk OTC drugs. Prescription drug management.           Final Clinical Impression(s) / ED Diagnoses Final diagnoses:  Colitis    Rx / DC Orders ED Discharge Orders          Ordered    ciprofloxacin (CIPRO) 500 MG tablet  Every 12 hours        07/13/23 1612    metroNIDAZOLE (FLAGYL) 500 MG tablet   2 times daily        07/13/23 1612              Carin Charleston, MD 07/13/23 [phone]

## 2023-07-13 NOTE — ED Triage Notes (Signed)
 Lbm on Wednesday, pt had contrast on Wednesday due to eye issue, had diarrhea after, and that was her last bm.

## 2023-07-13 NOTE — ED Notes (Signed)
 Pt aware of need for urine sample, unable to provide at this time.

## 2023-07-13 NOTE — ED Triage Notes (Signed)
 Lower abdomen, worse on right, pain started about 3 days ago. Dx UTI today, but the abdominal pain is not related to that. No fevers, no n/v

## 2023-07-15 ENCOUNTER — Telehealth: Payer: Self-pay

## 2023-07-15 DIAGNOSIS — M4727 Other spondylosis with radiculopathy, lumbosacral region: Secondary | ICD-10-CM | POA: Diagnosis not present

## 2023-07-15 DIAGNOSIS — M47816 Spondylosis without myelopathy or radiculopathy, lumbar region: Secondary | ICD-10-CM | POA: Diagnosis not present

## 2023-07-15 NOTE — Telephone Encounter (Signed)
   Pre-operative Risk Assessment    Patient Name: Sara Dominguez  DOB: 1940/06/06 MRN: [id number]   Date of last office visit: 02/04/23 Date of next office visit: Not scheduled   Request for Surgical Clearance    Procedure:  ESI  Date of Surgery:  Clearance TBD                                Surgeon:  Not listed Surgeon's Group or Practice Name:  [facility] Phone number:  [phone] Fax number:  [phone]   Type of Clearance Requested:   - Medical  - Pharmacy:  Hold Clopidogrel  (Plavix ) x 7 days prior   Type of Anesthesia:  Not Indicated   Additional requests/questions:    Gardiner Jumper   07/15/2023, 5:22 PM

## 2023-07-17 ENCOUNTER — Telehealth: Payer: Self-pay | Admitting: *Deleted

## 2023-07-17 NOTE — Telephone Encounter (Signed)
 Will reach out to preop APP as the requesting office sent a duplicate request inquiring if the pt has been cleared.

## 2023-07-17 NOTE — Telephone Encounter (Signed)
 Pt has been scheduled tele preop appt 07/26/23. Med rec and consent are done.   Preop APP has sent a message to Dr. Katheryne Pane in regard to Plavix , please see notes.

## 2023-07-17 NOTE — Telephone Encounter (Signed)
 Pt has been scheduled tele preop appt 07/26/23. Med rec and consent are done.      Patient Consent for Virtual Visit        Sara Dominguez has provided verbal consent on 07/17/2023 for a virtual visit (video or telephone).   CONSENT FOR VIRTUAL VISIT FOR:  Sara Dominguez  By participating in this virtual visit I agree to the following:  I hereby voluntarily request, consent and authorize [facility] and its employed or contracted physicians, physician assistants, nurse practitioners or other licensed health care professionals (the Practitioner), to provide me with telemedicine health care services (the "Services") as deemed necessary by the treating Practitioner. I acknowledge and consent to receive the Services by the Practitioner via telemedicine. I understand that the telemedicine visit will involve communicating with the Practitioner through live audiovisual communication technology and the disclosure of certain medical information by electronic transmission. I acknowledge that I have been given the opportunity to request an in-person assessment or other available alternative prior to the telemedicine visit and am voluntarily participating in the telemedicine visit.  I understand that I have the right to withhold or withdraw my consent to the use of telemedicine in the course of my care at any time, without affecting my right to future care or treatment, and that the Practitioner or I may terminate the telemedicine visit at any time. I understand that I have the right to inspect all information obtained and/or recorded in the course of the telemedicine visit and may receive copies of available information for a reasonable fee.  I understand that some of the potential risks of receiving the Services via telemedicine include:  Delay or interruption in medical evaluation due to technological equipment failure or disruption; Information transmitted may not be sufficient (e.g. poor  resolution of images) to allow for appropriate medical decision making by the Practitioner; and/or  In rare instances, security protocols could fail, causing a breach of personal health information.  Furthermore, I acknowledge that it is my responsibility to provide information about my medical history, conditions and care that is complete and accurate to the best of my ability. I acknowledge that Practitioner's advice, recommendations, and/or decision may be based on factors not within their control, such as incomplete or inaccurate data provided by me or distortions of diagnostic images or specimens that may result from electronic transmissions. I understand that the practice of medicine is not an exact science and that Practitioner makes no warranties or guarantees regarding treatment outcomes. I acknowledge that a copy of this consent can be made available to me via my patient portal [facility] MyChart), or I can request a printed copy by calling the office of [facility].    I understand that my insurance will be billed for this visit.   I have read or had this consent read to me. I understand the contents of this consent, which adequately explains the benefits and risks of the Services being provided via telemedicine.  I have been provided ample opportunity to ask questions regarding this consent and the Services and have had my questions answered to my satisfaction. I give my informed consent for the services to be provided through the use of telemedicine in my medical care

## 2023-07-18 DIAGNOSIS — R3915 Urgency of urination: Secondary | ICD-10-CM | POA: Diagnosis not present

## 2023-07-18 DIAGNOSIS — R350 Frequency of micturition: Secondary | ICD-10-CM | POA: Diagnosis not present

## 2023-07-23 DIAGNOSIS — Z720 Tobacco use: Secondary | ICD-10-CM | POA: Diagnosis not present

## 2023-07-23 DIAGNOSIS — I10 Essential (primary) hypertension: Secondary | ICD-10-CM | POA: Diagnosis not present

## 2023-07-23 DIAGNOSIS — Z6823 Body mass index (BMI) 23.0-23.9, adult: Secondary | ICD-10-CM | POA: Diagnosis not present

## 2023-07-23 DIAGNOSIS — M859 Disorder of bone density and structure, unspecified: Secondary | ICD-10-CM | POA: Diagnosis not present

## 2023-07-23 DIAGNOSIS — E7800 Pure hypercholesterolemia, unspecified: Secondary | ICD-10-CM | POA: Diagnosis not present

## 2023-07-23 DIAGNOSIS — M5417 Radiculopathy, lumbosacral region: Secondary | ICD-10-CM | POA: Diagnosis not present

## 2023-07-25 DIAGNOSIS — N3946 Mixed incontinence: Secondary | ICD-10-CM | POA: Diagnosis not present

## 2023-07-26 ENCOUNTER — Ambulatory Visit: Attending: Cardiovascular Disease | Admitting: Nurse Practitioner

## 2023-07-26 ENCOUNTER — Encounter: Payer: Self-pay | Admitting: Nurse Practitioner

## 2023-07-26 DIAGNOSIS — Z01810 Encounter for preprocedural cardiovascular examination: Secondary | ICD-10-CM | POA: Diagnosis not present

## 2023-07-26 NOTE — Progress Notes (Signed)
 Virtual Visit via Telephone Note   Because of Sara Dominguez [facility] co-morbid illnesses, she is at least at moderate risk for complications without adequate follow up.  This format is felt to be most appropriate for this patient at this time.  Due to technical limitations with video connection (technology), today's appointment will be conducted as an audio only telehealth visit, and Sara Dominguez [facility] verbally agreed to proceed in this manner.   All issues noted in this document were discussed and addressed.  No physical exam could be performed with this format.  Evaluation Performed:  Preoperative cardiovascular risk assessment _____________   Date:  07/26/2023   Patient ID:  Sara Dominguez, DOB 05-27-1940, MRN [id number] Patient Location:  Home Provider location:   Office  Primary Care Provider:  Perley Bradley, MD Primary Cardiologist:  Sara Portal, MD  Chief Complaint / Patient Profile   83 y.o. y/o female with a h/o CAD s/p LAD stent in 2006, RCA stent in 2011 x 2 in PAD s/p interventions in  2006, 2015 and 2016, carotid artery disease, AAA, HTN, tobacco abuse, HLD, PACs who is pending ESI  and presents today for telephonic preoperative cardiovascular risk assessment.  History of Present Illness    Sara Dominguez is a 83 y.o. female who presents via audio/video conferencing for a telehealth visit today.  Pt was last seen in cardiology clinic on 02/04/23 by Dr. Katheryne Dominguez.  At that time Sara Dominguez was doing well.  The patient is now pending procedure as outlined above. Since her last visit, she denies chest pain, shortness of breath, lower extremity edema, fatigue, palpitations, melena, hematuria, hemoptysis, diaphoresis, weakness, presyncope, syncope, orthopnea, and PND. She is active with house and yard work and is able to achieve > 4 METS Activity without concerning cardiac symptoms.    Past Medical History    Past Medical History:  Diagnosis Date   AAA (abdominal  aortic aneurysm), small, history of 2.3 X 2.4 07/06/2011   Abnormal ankle brachial index 07/06/2011   Anxiety    CAD (coronary artery disease), with LAD stent in 2006, chronically occluded OM branch with hx. of Stent to RCA  07/06/2011   Claudication in peripheral vascular disease, life style limiting 07/06/2011   Hyperlipidemia LDL goal < 70 09/08/2012   Hypertension    PAD (peripheral artery disease) [facility])    Past Surgical History:  Procedure Laterality Date   ABDOMINAL AORTAGRAM N/A 07/05/2011   Procedure: ABDOMINAL Tommi Fraise;  Surgeon: Avanell Leigh, MD;  Location: [facility] CATH LAB;  Service: Cardiovascular;  Laterality: N/A;   ABDOMINAL HYSTERECTOMY  1992   CATARACT EXTRACTION W/ INTRAOCULAR LENS  IMPLANT, BILATERAL Bilateral ~ 2006   CORONARY ANGIOPLASTY WITH STENT PLACEMENT  2006; 2009; 2013   1 + 1 + 2;  total of 4   ENTEROSCOPY N/A 08/29/2017   Procedure: ENTEROSCOPY;  Surgeon: Lanita Pitman, MD;  Location: [facility] ENDOSCOPY;  Service: Endoscopy;  Laterality: N/A;   ESOPHAGOGASTRODUODENOSCOPY (EGD) WITH PROPOFOL  N/A 06/07/2016   Procedure: ESOPHAGOGASTRODUODENOSCOPY (EGD) WITH PROPOFOL ;  Surgeon: Lanita Pitman, MD;  Location: [facility] ENDOSCOPY;  Service: Endoscopy;  Laterality: N/A;   FEMORAL ARTERY STENT Right 2006; 07/05/2011; 05/14/2013   FEMORAL ARTERY STENT Right 05/14/13   between previous stens   HOT HEMOSTASIS N/A 08/29/2017   Procedure: HOT HEMOSTASIS (ARGON PLASMA COAGULATION/BICAP);  Surgeon: Lanita Pitman, MD;  Location: [facility] ENDOSCOPY;  Service: Endoscopy;  Laterality: N/A;   LOWER EXTREMITY ANGIOGRAM N/A 07/05/2011   Procedure: LOWER EXTREMITY ANGIOGRAM;  Surgeon: Avanell Leigh, MD;  Location: [facility] CATH LAB;  Service: Cardiovascular;  Laterality: N/A;   LOWER EXTREMITY ANGIOGRAM N/A 05/14/2013   Procedure: LOWER EXTREMITY ANGIOGRAM;  Surgeon: Avanell Leigh, MD;  Location: [facility] CATH LAB;  Service: Cardiovascular;  Laterality: N/A;   LUMBAR DISC SURGERY  1980;; 1982   PERCUTANEOUS  STENT INTERVENTION Right 07/05/2011   Procedure: PERCUTANEOUS STENT INTERVENTION;  Surgeon: Avanell Leigh, MD;  Location: [facility] CATH LAB;  Service: Cardiovascular;  Laterality: Right;   PERIPHERAL VASCULAR CATHETERIZATION N/A 11/04/2014   Procedure: Lower Extremity Angiography;  Surgeon: Avanell Leigh, MD;  Location[facility] INVASIVE CV LAB;  Service: Cardiovascular;  Laterality: N/A;   PERIPHERAL VASCULAR CATHETERIZATION Right 11/04/2014   Procedure: Peripheral Vascular Intervention;  Surgeon: Avanell Leigh, MD;  Location: [facility] INVASIVE CV LAB;  Service: Cardiovascular;  Laterality: Right;  SFA   SHOULDER OPEN ROTATOR CUFF REPAIR Left ~ 2004    Allergies  No Known Allergies  Home Medications    Prior to Admission medications   Medication Sig Start Date End Date Taking? Authorizing Provider  acetaminophen  (TYLENOL ) 500 MG tablet Take 500-1,000 mg by mouth every 6 (six) hours as needed for mild pain or headache.    [provider]  alendronate (FOSAMAX) 70 MG tablet Take 70 mg by mouth every Wednesday. Take with a full glass of water on an empty stomach.    [provider]  atorvastatin  (LIPITOR) 80 MG tablet TAKE 1 TABLET EVERY DAY 02/25/23   Avanell Leigh, MD  Cholecalciferol  (VITAMIN D-3) 25 MCG (1000 UT) CAPS Take 2,000 Units by mouth daily.    [provider]  ciprofloxacin  (CIPRO ) 500 MG tablet Take 1 tablet (500 mg total) by mouth every 12 (twelve) hours. 07/13/23   Carin Charleston, MD  clopidogrel  (PLAVIX ) 75 MG tablet TAKE 1 TABLET EVERY DAY 04/24/23   Avanell Leigh, MD  cyanocobalamin  (VITAMIN B12) 1000 MCG tablet Take 1,000 mcg by mouth daily.    [provider]  DULoxetine  (CYMBALTA ) 30 MG capsule TAKE 1 CAPSULE BY MOUTH EVERY DAY 01/21/23   Phebe Brasil, MD  ferrous sulfate  325 (65 FE) MG tablet Take 325 mg by mouth every Wednesday.     [provider]  gabapentin  (NEURONTIN ) 100 MG capsule Take 3 capsules (300 mg total) by mouth 3  (three) times daily. 11/09/22   Phebe Brasil, MD  hydrALAZINE  (APRESOLINE ) 10 MG tablet Take 10 mg by mouth 2 (two) times daily.    [provider]  hydrochlorothiazide (HYDRODIURIL) 12.5 MG tablet Take 12.5 mg by mouth daily.    [provider]  losartan  (COZAAR ) 100 MG tablet Take 1 tablet (100 mg total) by mouth daily. 05/06/15   Avanell Leigh, MD  MAGNESIUM  PO Take 1 tablet by mouth daily in the afternoon.    [provider]  metroNIDAZOLE  (FLAGYL ) 500 MG tablet Take 1 tablet (500 mg total) by mouth 2 (two) times daily. 07/13/23   Carin Charleston, MD  Omega-3 Fatty Acids (FISH OIL) 1000 MG CAPS Take 1,000 mg by mouth daily.     [provider]  pantoprazole  (PROTONIX ) 40 MG tablet Take 40 mg by mouth daily. 07/06/19   [provider]  vitamin B-12 (CYANOCOBALAMIN ) 250 MCG tablet Take 250 mcg by mouth daily.    [provider]    Physical Exam    Vital Signs:  Sara Dominguez does not have vital signs available for review today.  Given telephonic nature of communication, physical exam is limited. AAOx3. NAD. Normal affect.  Speech and respirations are unlabored.  Accessory Clinical Findings    None  Assessment & Plan    1.  Preoperative Cardiovascular Risk Assessment: According to the Revised Cardiac Risk Index (RCRI), her Perioperative Risk of Major Cardiac Event is (%): 0.9. Her Functional Capacity in METs is: 7.34 according to the Duke Activity Status Index (DASI). The patient is doing well from a cardiac perspective. Therefore, based on ACC/AHA guidelines, the patient would be at acceptable risk for the planned procedure without further cardiovascular testing.   The patient was advised that if she develops new symptoms prior to surgery to contact our office to arrange for a follow-up visit, and she verbalized understanding.  Per office protocol, he may hold Plavix  for 7 days prior to procedure and should resume as soon as  hemodynamically stable postoperatively.  A copy of this note will be routed to requesting surgeon.  Time:   Today, I have spent 10 minutes with the patient with telehealth technology discussing medical history, symptoms, and management plan.    Gerldine Koch, NP-C  07/26/2023, 10:04 AM [address], [address] [place], [place] [address] Office [phone] Fax [phone]

## 2023-07-30 NOTE — Telephone Encounter (Signed)
 Will forward to preop APP to see notes from Dr. Katheryne Pane and any final notes needed.

## 2023-07-30 NOTE — Telephone Encounter (Signed)
 Pre-op evaluation performed via telephone visit on 6/13. Routed note to requesting office.   Will remove request from pre-op pool.   Lonell Rives. Nicholette Dolson, DNP, NP-C  07/30/2023, 10:59 AM [facility] [address] Office [phone] Fax [phone]

## 2023-07-30 NOTE — Telephone Encounter (Signed)
 [facility] calling because they have not received clearance. Please fax to [phone]

## 2023-08-06 ENCOUNTER — Other Ambulatory Visit: Payer: Self-pay | Admitting: Gastroenterology

## 2023-08-06 DIAGNOSIS — R933 Abnormal findings on diagnostic imaging of other parts of digestive tract: Secondary | ICD-10-CM | POA: Diagnosis not present

## 2023-08-06 DIAGNOSIS — K529 Noninfective gastroenteritis and colitis, unspecified: Secondary | ICD-10-CM | POA: Diagnosis not present

## 2023-08-07 DIAGNOSIS — Z6823 Body mass index (BMI) 23.0-23.9, adult: Secondary | ICD-10-CM | POA: Diagnosis not present

## 2023-08-07 DIAGNOSIS — Z0000 Encounter for general adult medical examination without abnormal findings: Secondary | ICD-10-CM | POA: Diagnosis not present

## 2023-08-09 ENCOUNTER — Ambulatory Visit
Admission: RE | Admit: 2023-08-09 | Discharge: 2023-08-09 | Disposition: A | Discharge status: Home / Self Care | Source: Ambulatory Visit | Attending: Gastroenterology | Admitting: Gastroenterology

## 2023-08-09 DIAGNOSIS — R933 Abnormal findings on diagnostic imaging of other parts of digestive tract: Secondary | ICD-10-CM

## 2023-08-09 DIAGNOSIS — I7143 Infrarenal abdominal aortic aneurysm, without rupture: Secondary | ICD-10-CM | POA: Diagnosis not present

## 2023-08-09 DIAGNOSIS — K439 Ventral hernia without obstruction or gangrene: Secondary | ICD-10-CM | POA: Diagnosis not present

## 2023-08-09 DIAGNOSIS — J90 Pleural effusion, not elsewhere classified: Secondary | ICD-10-CM | POA: Diagnosis not present

## 2023-08-09 MED ORDER — IOPAMIDOL (ISOVUE-300) INJECTION 61%
100.0000 mL | Freq: Once | INTRAVENOUS | Status: AC | PRN
Start: 2023-08-09 — End: 2023-08-09
  Administered 2023-08-09: 100 mL via INTRAVENOUS

## 2023-08-15 DIAGNOSIS — M5416 Radiculopathy, lumbar region: Secondary | ICD-10-CM | POA: Diagnosis not present

## 2023-08-15 DIAGNOSIS — M4727 Other spondylosis with radiculopathy, lumbosacral region: Secondary | ICD-10-CM | POA: Diagnosis not present

## 2023-08-29 DIAGNOSIS — R399 Unspecified symptoms and signs involving the genitourinary system: Secondary | ICD-10-CM | POA: Diagnosis not present

## 2023-08-29 DIAGNOSIS — N390 Urinary tract infection, site not specified: Secondary | ICD-10-CM | POA: Diagnosis not present

## 2023-08-29 DIAGNOSIS — R197 Diarrhea, unspecified: Secondary | ICD-10-CM | POA: Diagnosis not present

## 2023-08-29 DIAGNOSIS — R319 Hematuria, unspecified: Secondary | ICD-10-CM | POA: Diagnosis not present

## 2023-08-29 DIAGNOSIS — E876 Hypokalemia: Secondary | ICD-10-CM | POA: Diagnosis not present

## 2023-08-30 DIAGNOSIS — R059 Cough, unspecified: Secondary | ICD-10-CM | POA: Diagnosis not present

## 2023-08-30 DIAGNOSIS — J209 Acute bronchitis, unspecified: Secondary | ICD-10-CM | POA: Diagnosis not present

## 2023-08-30 DIAGNOSIS — J440 Chronic obstructive pulmonary disease with acute lower respiratory infection: Secondary | ICD-10-CM | POA: Diagnosis not present

## 2023-10-16 DIAGNOSIS — R300 Dysuria: Secondary | ICD-10-CM | POA: Diagnosis not present

## 2023-10-16 DIAGNOSIS — N3000 Acute cystitis without hematuria: Secondary | ICD-10-CM | POA: Diagnosis not present

## 2023-10-16 DIAGNOSIS — R109 Unspecified abdominal pain: Secondary | ICD-10-CM | POA: Diagnosis not present

## 2023-10-17 ENCOUNTER — Other Ambulatory Visit: Payer: Self-pay | Admitting: Family Medicine

## 2023-10-17 DIAGNOSIS — Z1231 Encounter for screening mammogram for malignant neoplasm of breast: Secondary | ICD-10-CM

## 2023-10-22 DIAGNOSIS — M76821 Posterior tibial tendinitis, right leg: Secondary | ICD-10-CM | POA: Diagnosis not present

## 2023-10-22 DIAGNOSIS — M25551 Pain in right hip: Secondary | ICD-10-CM | POA: Diagnosis not present

## 2023-10-22 DIAGNOSIS — M79671 Pain in right foot: Secondary | ICD-10-CM | POA: Diagnosis not present

## 2023-10-22 DIAGNOSIS — M533 Sacrococcygeal disorders, not elsewhere classified: Secondary | ICD-10-CM | POA: Diagnosis not present

## 2023-11-12 ENCOUNTER — Other Ambulatory Visit: Payer: Self-pay | Admitting: Neurology

## 2023-11-13 DIAGNOSIS — N390 Urinary tract infection, site not specified: Secondary | ICD-10-CM | POA: Diagnosis not present

## 2023-11-14 ENCOUNTER — Encounter (INDEPENDENT_AMBULATORY_CARE_PROVIDER_SITE_OTHER): Admitting: Ophthalmology

## 2023-11-14 DIAGNOSIS — H35033 Hypertensive retinopathy, bilateral: Secondary | ICD-10-CM

## 2023-11-14 DIAGNOSIS — H43813 Vitreous degeneration, bilateral: Secondary | ICD-10-CM

## 2023-11-14 DIAGNOSIS — I10 Essential (primary) hypertension: Secondary | ICD-10-CM

## 2023-11-14 DIAGNOSIS — H348322 Tributary (branch) retinal vein occlusion, left eye, stable: Secondary | ICD-10-CM | POA: Diagnosis not present

## 2023-11-15 DIAGNOSIS — K529 Noninfective gastroenteritis and colitis, unspecified: Secondary | ICD-10-CM | POA: Diagnosis not present

## 2023-11-15 DIAGNOSIS — R198 Other specified symptoms and signs involving the digestive system and abdomen: Secondary | ICD-10-CM | POA: Diagnosis not present

## 2023-11-21 ENCOUNTER — Ambulatory Visit
Admission: RE | Admit: 2023-11-21 | Discharge: 2023-11-21 | Disposition: A | Discharge status: Home / Self Care | Source: Ambulatory Visit | Attending: Family Medicine | Admitting: Family Medicine

## 2023-11-21 DIAGNOSIS — Z1231 Encounter for screening mammogram for malignant neoplasm of breast: Secondary | ICD-10-CM | POA: Diagnosis not present

## 2023-11-25 DIAGNOSIS — R399 Unspecified symptoms and signs involving the genitourinary system: Secondary | ICD-10-CM | POA: Diagnosis not present

## 2023-11-25 DIAGNOSIS — N3000 Acute cystitis without hematuria: Secondary | ICD-10-CM | POA: Diagnosis not present

## 2023-11-26 ENCOUNTER — Other Ambulatory Visit: Payer: Self-pay | Admitting: Family Medicine

## 2023-11-26 DIAGNOSIS — R928 Other abnormal and inconclusive findings on diagnostic imaging of breast: Secondary | ICD-10-CM

## 2023-11-27 DIAGNOSIS — N3020 Other chronic cystitis without hematuria: Secondary | ICD-10-CM | POA: Diagnosis not present

## 2023-11-27 DIAGNOSIS — R350 Frequency of micturition: Secondary | ICD-10-CM | POA: Diagnosis not present

## 2023-11-27 DIAGNOSIS — R399 Unspecified symptoms and signs involving the genitourinary system: Secondary | ICD-10-CM | POA: Diagnosis not present

## 2023-11-28 ENCOUNTER — Other Ambulatory Visit: Payer: Self-pay | Admitting: Cardiovascular Disease

## 2023-12-03 ENCOUNTER — Ambulatory Visit

## 2023-12-03 ENCOUNTER — Ambulatory Visit
Admission: RE | Admit: 2023-12-03 | Discharge: 2023-12-03 | Disposition: A | Source: Ambulatory Visit | Attending: Family Medicine | Admitting: Family Medicine

## 2023-12-03 DIAGNOSIS — R928 Other abnormal and inconclusive findings on diagnostic imaging of breast: Secondary | ICD-10-CM

## 2023-12-16 ENCOUNTER — Encounter (HOSPITAL_COMMUNITY): Payer: Medicare PPO

## 2023-12-23 ENCOUNTER — Ambulatory Visit: Payer: Self-pay | Admitting: Cardiovascular Disease

## 2023-12-23 ENCOUNTER — Ambulatory Visit (HOSPITAL_COMMUNITY)
Admission: RE | Admit: 2023-12-23 | Discharge: 2023-12-23 | Disposition: A | Discharge status: Home / Self Care | Source: Ambulatory Visit | Attending: Cardiovascular Disease | Admitting: Cardiovascular Disease

## 2023-12-23 ENCOUNTER — Ambulatory Visit (HOSPITAL_BASED_OUTPATIENT_CLINIC_OR_DEPARTMENT_OTHER)
Admission: RE | Admit: 2023-12-23 | Discharge: 2023-12-23 | Disposition: A | Discharge status: Home / Self Care | Source: Ambulatory Visit | Attending: Cardiovascular Disease | Admitting: Cardiovascular Disease

## 2023-12-23 ENCOUNTER — Encounter (HOSPITAL_COMMUNITY)

## 2023-12-23 DIAGNOSIS — I2510 Atherosclerotic heart disease of native coronary artery without angina pectoris: Secondary | ICD-10-CM | POA: Insufficient documentation

## 2023-12-23 DIAGNOSIS — I6523 Occlusion and stenosis of bilateral carotid arteries: Secondary | ICD-10-CM | POA: Diagnosis not present

## 2023-12-23 DIAGNOSIS — E785 Hyperlipidemia, unspecified: Secondary | ICD-10-CM | POA: Insufficient documentation

## 2023-12-23 DIAGNOSIS — I739 Peripheral vascular disease, unspecified: Secondary | ICD-10-CM

## 2023-12-23 LAB — VAS US ABI WITH/WO TBI
Left ABI: 0.43
Right ABI: 0.68

## 2023-12-30 ENCOUNTER — Other Ambulatory Visit: Payer: Self-pay | Admitting: Cardiovascular Disease

## 2024-02-18 ENCOUNTER — Encounter: Payer: Self-pay | Admitting: Cardiovascular Disease

## 2024-02-18 ENCOUNTER — Ambulatory Visit: Attending: Cardiovascular Disease | Admitting: Cardiovascular Disease

## 2024-02-18 VITALS — BP 130/64 | HR 67 | Ht 67.0 in | Wt 136.0 lb

## 2024-02-18 DIAGNOSIS — I2510 Atherosclerotic heart disease of native coronary artery without angina pectoris: Secondary | ICD-10-CM

## 2024-02-18 DIAGNOSIS — I739 Peripheral vascular disease, unspecified: Secondary | ICD-10-CM

## 2024-02-18 DIAGNOSIS — E785 Hyperlipidemia, unspecified: Secondary | ICD-10-CM | POA: Diagnosis not present

## 2024-02-18 DIAGNOSIS — I6522 Occlusion and stenosis of left carotid artery: Secondary | ICD-10-CM | POA: Diagnosis not present

## 2024-02-18 DIAGNOSIS — Z720 Tobacco use: Secondary | ICD-10-CM

## 2024-02-18 DIAGNOSIS — I10 Essential (primary) hypertension: Secondary | ICD-10-CM | POA: Diagnosis not present

## 2024-02-18 NOTE — Assessment & Plan Note (Signed)
Ongoing tobacco abuse of 1 pack/day recalcitrant to risk factor modification. 

## 2024-02-18 NOTE — Patient Instructions (Signed)
 Medication Instructions:  Your physician recommends that you continue on your current medications as directed. Please refer to the Current Medication list given to you today.  *If you need a refill on your cardiac medications before your next appointment, please call your pharmacy*  Lab Work: Today- BMET& CBC  If you have labs (blood work) drawn today and your tests are completely normal, you will receive your results only by: MyChart Message (if you have MyChart) OR A paper copy in the mail If you have any lab test that is abnormal or we need to change your treatment, we will call you to review the results.  Testing/Procedures: Your physician has requested that you have a lower extremity arterial duplex. During this test, ultrasound is used to evaluate arterial blood flow in the legs. Allow one hour for this exam. There are no restrictions or special instructions. This will take place at [address], 4th floor **To do 1-2 weeks after PV procedure (1/30)**  Please note: We ask at that you not bring children with you during ultrasound (echo/ vascular) testing. Due to room size and safety concerns, children are not allowed in the ultrasound rooms during exams. Our front office staff cannot provide observation of children in our lobby area while testing is being conducted. An adult accompanying a patient to their appointment will only be allowed in the ultrasound room at the discretion of the ultrasound technician under special circumstances. We apologize for any inconvenience.   Your physician has requested that you have an ankle brachial index (ABI). During this test an ultrasound and blood pressure cuff are used to evaluate the arteries that supply the arms and legs with blood. Allow thirty minutes for this exam. There are no restrictions or special instructions. This will take place at [address] 4th floor   **To do 1-2 weeks after PV procedure (1/30)**   Please note: We ask at  that you not bring children with you during ultrasound (echo/ vascular) testing. Due to room size and safety concerns, children are not allowed in the ultrasound rooms during exams. Our front office staff cannot provide observation of children in our lobby area while testing is being conducted. An adult accompanying a patient to their appointment will only be allowed in the ultrasound room at the discretion of the ultrasound technician under special circumstances. We apologize for any inconvenience.   Follow-Up: At [facility], you and your health needs are our priority.  As part of our continuing mission to provide you with exceptional heart care, our providers are all part of one team.  This team includes your primary Cardiologist (physician) and Advanced Practice Providers or APPs (Physician Assistants and Nurse Practitioners) who all work together to provide you with the care you need, when you need it.  Your next appointment:   2-3 week(s) after PV procedure (1/30)  Provider:   Dr. Darron   Then see Dr. Court in 3 months.  We recommend signing up for the patient portal called MyChart.  Sign up information is provided on this After Visit Summary.  MyChart is used to connect with patients for Virtual Visits (Telemedicine).  Patients are able to view lab/test results, encounter notes, upcoming appointments, etc.  Non-urgent messages can be sent to your provider as well.   To learn more about what you can do with MyChart, go to [email or web address].   Other Instructions       Cardiac/Peripheral Catheterization   You are scheduled for a Peripheral  Angiogram on Friday, January 30 with Dr. Deatrice Cage.  1. Please arrive at the [facility] (Main Entrance A) at [facility]: [address] [place], [place] [address] at 5:30 AM (This time is 2 hour(s) before your procedure to ensure your preparation).   Free valet parking service is available. You will check in at  ADMITTING. The support person will be asked to wait in the waiting room.  It is OK to have someone drop you off and come back when you are ready to be discharged.        Special note: Every effort is made to have your procedure done on time. Please understand that emergencies sometimes delay scheduled procedures.  2. Diet: Nothing to eat after midnight.  3. Hydration:You need to be well hydrated before your procedure. On January 30, you may drink approved liquids (see below) until 2 hours before the procedure, with 16 oz of water as your last intake.   List of approved liquids water, clear juice, clear tea, black coffee, fruit juices, non-citric and without pulp, carbonated beverages, Gatorade, Kool -Aid, plain Jello-O and plain ice popsicles.  4. Labs: You will need to have blood drawn today (1/6).   5. Medication instructions in preparation for your procedure:     Stop taking, Cozaar  (Losartan ) Thursday, January 29,, HTCZ (Hydrochlorothiazide) Friday, January 30,  On the morning of your procedure, take Aspirin  81 mg and Plavix /Clopidogrel  and any morning medicines NOT listed above.  You may use sips of water.  6. Plan to go home the same day, you will only stay overnight if medically necessary. 7. You MUST have a responsible adult to drive you home. 8. An adult MUST be with you the first 24 hours after you arrive home. 9. Bring a current list of your medications, and the last time and date medication taken. 10. Bring ID and current insurance cards. 11.Please wear clothes that are easy to get on and off and wear slip-on shoes.  Thank you for allowing us  to care for you!   -- [facility]

## 2024-02-18 NOTE — Assessment & Plan Note (Signed)
 History of small abdominal aortic aneurysm measuring 3.9 cm.  This will be checked on an annual basis.

## 2024-02-18 NOTE — Progress Notes (Signed)
 "     02/18/2024 Sara Dominguez [facility]   [birth year]  [id number]  Primary Physician Cleotilde Planas, MD Primary Cardiologist: Dorn JINNY Lesches MD FACP, [place], [place], FSCAI  HPI:  Sara Dominguez is a 84 y.o.     thin-appearing, married Caucasian female, mother of 2, grandmother to 3 grandchildren who I last saw in the office 02/04/2023.[name]  She is accompanied by her husband Sara Dominguez  who is also a patient of mine.  She has a history of CAD status post LAD stenting by myself in 2006, as well as right SFA stenting several months thereafter. She just stopped smoking in July of last year. I catheterized her August 05, 2009, revealing a patent LAD stent and a chronically occluded OM branch with high-grade mid RCA lesion which I stented with a Taxus Ion drug-eluting stent. At that time, I demonstrated a small abdominal aortic aneurysm measuring 2.3 x 2.4 cm. In addition, she developed recurrent lower extremity pain with Dopplers that showed a decrease in the right ABI from 0.99 to 0.75 with a high frequency signal in her mid right SFA. I angiogram'd her Jul 05, 2011, revealing proximal right SFA lesion along with in-stent restenosis. I revascularized her percutaneously which resulted in marked improvement in her ability to ambulate and in her Dopplers. She did develop arm pain while at [facility] in July of last year and was cath'd at [facility] by Dr. Janeann radially who placed 2 drug-eluting stents in the right coronary artery.    Because of recurrent claudication and abnormal Doppler studies I re-angiogramed to her 05/14/13 revealing high-grade disease in the mid right SFA between 2 previously placed stents. I restarted her with a 6 mm x 100 mm long Viabahn  covered stent with excellent angiographic and clinical result. Her follow-up lower extremity arterial Doppler studies performed 12/04/13 revealed ABIs of 1 bilaterally with a widely patent stent. Since I saw her last she is complaining of some right lateral  thigh pain and tingling in her right foot as well as dysesthesia in both upper extremities. She does have a palpable pedal pulse on the right side. I performed a lower extremity ultrasound on her 09/27/14 revealing a high-frequency signal in the mid right SFA with a decline in her right ABI from 1.0 -.79. I angiogramed her 11/04/14 revealing a 95% stenosis of the leading edge of the previously placed covered stent which I restented using a 6 mm x 25 mm long Viabahn covered stent recent lower extremity arterial Doppler studies performed 07/25/16 revealed widely patent stents in her right SFA with ABIs of approximately 1 bilaterally. She denies claudication. In addition, she denies chest pain or shortness of breath. She did have endoscopy performed back in April because of anemia and a positive occult fecal blood test and this demonstrated a gastric ulcer. Her antiplatelet locations where held for a short period of time.   She was admitted to the hospital with of breath and lower extremity weakness.  2D echo was essentially normal.  Her hemoglobin was in the low 7 range and she was transfused 2 units of packed red blood cells which improved her shortness of breath and claudication.  Dopplers did however show a decline in her right ABI down to the .79 range similar to prior Dopplers when she had restenosed on that side although she currently denies claudication.   Since I saw her a year ago she is remained stable.  She denies chest pain or shortness of breath.  She does complain of right hip and lower extremity claudication.  She also has peripheral neuropathy.  She had Doppler studies performed 01/01/2023 that revealed a high-grade lesion in her right external iliac artery as well as distal left SFA although she symptomatic on the right.  Unfortunately, she does continue to smoke.   Active Medications[1]   Allergies[2]  Social History   Socioeconomic History   Marital status: Married    Spouse name: Not on  file   Number of children: Not on file   Years of education: Not on file   Highest education level: Not on file  Occupational History   Not on file  Tobacco Use   Smoking status: Every Day    Current packs/day: 0.00    Average packs/day: 1 pack/day for 50.0 years (50.0 ttl pk-yrs)    Types: Cigarettes    Start date: 07/13/1960    Last attempt to quit: 07/14/2010    Years since quitting: 13.6   Smokeless tobacco: Never   Tobacco comments:    Resumed smoking 8 months ago, smokes approximately 7 cigarettes daily.. Counseling given    02/09/2022 Patient smokes 3 daily  Substance and Sexual Activity   Alcohol use: Yes    Alcohol/week: 3.0 standard drinks of alcohol    Types: 3 Glasses of wine per week    Comment: social   Drug use: No   Sexual activity: Not Currently  Other Topics Concern   Not on file  Social History Narrative   Not on file   Social Drivers of Health   Tobacco Use: High Risk (02/18/2024)   Patient History    Smoking Tobacco Use: Every Day    Smokeless Tobacco Use: Never    Passive Exposure: Not on file  Financial Resource Strain: Low Risk (10/13/2021)   Received from [facility]   Overall Financial Resource Strain (CARDIA)    Difficulty of Paying Living Expenses: Not hard at all  Food Insecurity: No Food Insecurity (10/13/2021)   Received from [facility]   Epic    Within the past 12 months, you worried that your food would run out before you got the money to buy more.: Never true    Within the past 12 months, the food you bought just didn't last and you didn't have money to get more.: Never true  Transportation Needs: No Transportation Needs (10/13/2021)   Received from [facility] - Transportation    Lack of Transportation (Medical): No    Lack of Transportation (Non-Medical): No  Physical Activity: Inactive (10/13/2021)   Received from [facility]   Exercise Vital Sign    On average, how many days per week do you engage in moderate to  strenuous exercise (like a brisk walk)?: 0 days    On average, how many minutes do you engage in exercise at this level?: 0 min  Stress: No Stress Concern Present (10/13/2021)   Received from [facility] of Occupational Health - Occupational Stress Questionnaire    Feeling of Stress : Not at all  Social Connections: Moderately Isolated (10/13/2021)   Received from [facility]   Social Connection and Isolation Panel    In a typical week, how many times do you talk on the phone with family, friends, or neighbors?: Once a week    How often do you get together with friends or relatives?: Once a week    How often do you attend church or religious services?: More  than 4 times per year    Do you belong to any clubs or organizations such as church groups, unions, fraternal or athletic groups, or school groups?: No    How often do you attend meetings of the clubs or organizations you belong to?: Never    Are you married, widowed, divorced, separated, never married, or living with a partner?: Married  Intimate Partner Violence: Not At Risk (10/13/2021)   Received from [facility]   Epic    Within the last year, have you been afraid of your partner or ex-partner?: No    Within the last year, have you been humiliated or emotionally abused in other ways by your partner or ex-partner?: No    Within the last year, have you been kicked, hit, slapped, or otherwise physically hurt by your partner or ex-partner?: No    Within the last year, have you been raped or forced to have any kind of sexual activity by your partner or ex-partner?: No  Depression (PHQ2-9): Not on file  Alcohol Screen: Not on file  Housing: Not on file  Utilities: Not on file  Health Literacy: Not on file     Review of Systems: General: negative for chills, fever, night sweats or weight changes.  Cardiovascular: negative for chest pain, dyspnea on exertion, edema, orthopnea, palpitations, paroxysmal nocturnal  dyspnea or shortness of breath Dermatological: negative for rash Respiratory: negative for cough or wheezing Urologic: negative for hematuria Abdominal: negative for nausea, vomiting, diarrhea, bright red blood per rectum, melena, or hematemesis Neurologic: negative for visual changes, syncope, or dizziness All other systems reviewed and are otherwise negative except as noted above.    Blood pressure 130/64, pulse 67, height 5' 7 (1.702 m), weight 136 lb (61.7 kg), SpO2 91%.  General appearance: alert and no distress Neck: no adenopathy, no JVD, supple, symmetrical, trachea midline, thyroid  not enlarged, symmetric, no tenderness/mass/nodules, and left carotid bruit Lungs: clear to auscultation bilaterally Heart: regular rate and rhythm, S1, S2 normal, no murmur, click, rub or gallop Extremities: extremities normal, atraumatic, no cyanosis or edema Pulses: Diminished pedal pulses Skin: Skin color, texture, turgor normal. No rashes or lesions Neurologic: Grossly normal  EKG EKG Interpretation Date/Time:  Tuesday February 18 2024 11:03:32 EST Ventricular Rate:  67 PR Interval:  158 QRS Duration:  70 QT Interval:  408 QTC Calculation: 431 R Axis:   45  Text Interpretation: Sinus rhythm with Premature atrial complexes Cannot rule out Anterior infarct , age undetermined When compared with ECG of 13-Jul-2023 15:13, PREVIOUS ECG IS PRESENT Confirmed by Court Carrier [phone]) on 02/18/2024 11:35:34 AM    ASSESSMENT AND PLAN:   Claudication in peripheral vascular disease, life style limiting History of PAD status post percutaneous revascularization of her right SFA by myself 5/23 5/13.  She has had multiple interventions since that time most recently 07/25/2016.  She complains of right hip and thigh pain with Dopplers performed 01/01/2023 revealing a small abdominal aortic aneurysm with a high-frequency signal in her right external iliac artery.  She also has a high-frequency signal in her  distal left SFA although she is asymptomatic on that side.  Based on this I have decided to proceed with outpatient peripheral angiography potential endovascular therapy.  I have asked Dr. Darron to perform this on 03/13/2024.  CAD- LAD stent in 2006, chronically occluded OM branch with hx. of Stent to RCA  History of CAD status post LAD stenting by myself in 2006.  I recatheterized her 08/01/2009 revealing a  patent LAD stent with a chronically occluded OM branch and high-grade mid RCA stenosis which I stented with a Taxus IM drug-eluting stent.  She did have 2 stents placed in her RCA at [facility] by Dr. Haze.  She has been asymptomatic since.  Hyperlipidemia with target LDL less than 70 History of hyperlipidemia on high-dose statin therapy with lipid profile performed 07/23/2023 revealing total cholesterol of 104, LDL 49 HDL 38.  Essential hypertension History of essential hypertension blood pressure measured today at 130/64.  She is on hydralazine , hydrochlorothiazide and losartan .  AAA (abdominal aortic aneurysm), small, history of 2.3 X 2.4 History of small abdominal aortic aneurysm measuring 3.9 cm.  This will be checked on an annual basis.  Carotid artery disease History of moderate left ICA stenosis by duplex ultrasound 12/23/2023.  This to be repeated on an annual basis.  Tobacco abuse Ongoing tobacco abuse of 1 pack/day recalcitrant to risk factor modification.     Dorn DOROTHA Lesches MD FACP,FACC,FAHA, FSCAI 02/18/2024 12:07 PM    [1]  Current Meds  Medication Sig   acetaminophen  (TYLENOL ) 500 MG tablet Take 500-1,000 mg by mouth every 6 (six) hours as needed for mild pain or headache.   alendronate (FOSAMAX) 70 MG tablet Take 70 mg by mouth every Wednesday. Take with a full glass of water on an empty stomach.   atorvastatin  (LIPITOR) 80 MG tablet TAKE 1 TABLET EVERY DAY   Cholecalciferol  (VITAMIN D-3) 25 MCG (1000 UT) CAPS Take 2,000 Units by mouth daily.    ciprofloxacin  (CIPRO ) 500 MG tablet Take 1 tablet (500 mg total) by mouth every 12 (twelve) hours.   clopidogrel  (PLAVIX ) 75 MG tablet TAKE 1 TABLET EVERY DAY   cyanocobalamin  (VITAMIN B12) 1000 MCG tablet Take 1,000 mcg by mouth daily.   DULoxetine  (CYMBALTA ) 30 MG capsule TAKE 1 CAPSULE BY MOUTH EVERY DAY   ferrous sulfate  325 (65 FE) MG tablet Take 325 mg by mouth every Wednesday.    gabapentin  (NEURONTIN ) 100 MG capsule TAKE 3 CAPSULES BY MOUTH 3 TIMES DAILY.   hydrALAZINE  (APRESOLINE ) 10 MG tablet Take 10 mg by mouth 2 (two) times daily.   hydrochlorothiazide (HYDRODIURIL) 12.5 MG tablet Take 12.5 mg by mouth daily.   losartan  (COZAAR ) 100 MG tablet Take 1 tablet (100 mg total) by mouth daily.   Omega-3 Fatty Acids (FISH OIL) 1000 MG CAPS Take 1,000 mg by mouth daily.    pantoprazole  (PROTONIX ) 40 MG tablet Take 40 mg by mouth daily.  [2] No Known Allergies  "

## 2024-02-18 NOTE — Assessment & Plan Note (Signed)
 History of CAD status post LAD stenting by myself in 2006.  I recatheterized her 08/01/2009 revealing a patent LAD stent with a chronically occluded OM branch and high-grade mid RCA stenosis which I stented with a Taxus IM drug-eluting stent.  She did have 2 stents placed in her RCA at [facility] by Dr. Haze.  She has been asymptomatic since.

## 2024-02-18 NOTE — Assessment & Plan Note (Signed)
 History of hyperlipidemia on high-dose statin therapy with lipid profile performed 07/23/2023 revealing total cholesterol of 104, LDL 49 HDL 38.

## 2024-02-18 NOTE — Assessment & Plan Note (Signed)
 History of PAD status post percutaneous revascularization of her right SFA by myself 5/23 5/13.  She has had multiple interventions since that time most recently 07/25/2016.  She complains of right hip and thigh pain with Dopplers performed 01/01/2023 revealing a small abdominal aortic aneurysm with a high-frequency signal in her right external iliac artery.  She also has a high-frequency signal in her distal left SFA although she is asymptomatic on that side.  Based on this I have decided to proceed with outpatient peripheral angiography potential endovascular therapy.  I have asked Dr. Darron to perform this on 03/13/2024.

## 2024-02-18 NOTE — Assessment & Plan Note (Signed)
 History of moderate left ICA stenosis by duplex ultrasound 12/23/2023.  This to be repeated on an annual basis.

## 2024-02-18 NOTE — Assessment & Plan Note (Signed)
 History of essential hypertension blood pressure measured today at 130/64.  She is on hydralazine , hydrochlorothiazide and losartan .

## 2024-02-19 LAB — BASIC METABOLIC PANEL WITH GFR
BUN/Creatinine Ratio: 19 (ref 12–28)
BUN: 15 mg/dL (ref 8–27)
CO2: 23 mmol/L (ref 20–29)
Calcium: 9.1 mg/dL (ref 8.7–10.3)
Chloride: 105 mmol/L (ref 96–106)
Creatinine, Ser: 0.78 mg/dL (ref 0.57–1.00)
Glucose: 78 mg/dL (ref 70–99)
Potassium: 3.8 mmol/L (ref 3.5–5.2)
Sodium: 141 mmol/L (ref 134–144)
eGFR: 75 mL/min/1.73

## 2024-02-19 LAB — CBC
Hematocrit: 40.4 % (ref 34.0–46.6)
Hemoglobin: 13.1 g/dL (ref 11.1–15.9)
MCH: 32.7 pg (ref 26.6–33.0)
MCHC: 32.4 g/dL (ref 31.5–35.7)
MCV: 101 fL — ABNORMAL HIGH (ref 79–97)
Platelets: 241 x10E3/uL (ref 150–450)
RBC: 4.01 x10E6/uL (ref 3.77–5.28)
RDW: 13.1 % (ref 11.7–15.4)
WBC: 9.1 x10E3/uL (ref 3.4–10.8)

## 2024-02-20 ENCOUNTER — Ambulatory Visit: Payer: Self-pay | Admitting: Cardiovascular Disease

## 2024-02-24 ENCOUNTER — Other Ambulatory Visit: Payer: Self-pay | Admitting: Cardiovascular Disease

## 2024-02-25 ENCOUNTER — Other Ambulatory Visit: Payer: Self-pay | Admitting: Cardiovascular Disease

## 2024-02-25 DIAGNOSIS — I739 Peripheral vascular disease, unspecified: Secondary | ICD-10-CM

## 2024-03-03 ENCOUNTER — Emergency Department (HOSPITAL_BASED_OUTPATIENT_CLINIC_OR_DEPARTMENT_OTHER): Admitting: Radiology

## 2024-03-03 ENCOUNTER — Other Ambulatory Visit: Payer: Self-pay

## 2024-03-03 ENCOUNTER — Observation Stay (HOSPITAL_BASED_OUTPATIENT_CLINIC_OR_DEPARTMENT_OTHER)
Admission: EM | Admit: 2024-03-03 | Discharge: 2024-03-07 | Disposition: A | Discharge status: Home / Self Care | Attending: Emergency Medicine | Admitting: Emergency Medicine

## 2024-03-03 ENCOUNTER — Encounter (HOSPITAL_BASED_OUTPATIENT_CLINIC_OR_DEPARTMENT_OTHER): Payer: Self-pay

## 2024-03-03 DIAGNOSIS — R2689 Other abnormalities of gait and mobility: Secondary | ICD-10-CM | POA: Diagnosis not present

## 2024-03-03 DIAGNOSIS — Z7902 Long term (current) use of antithrombotics/antiplatelets: Secondary | ICD-10-CM | POA: Insufficient documentation

## 2024-03-03 DIAGNOSIS — G629 Polyneuropathy, unspecified: Secondary | ICD-10-CM | POA: Insufficient documentation

## 2024-03-03 DIAGNOSIS — J9690 Respiratory failure, unspecified, unspecified whether with hypoxia or hypercapnia: Secondary | ICD-10-CM | POA: Diagnosis not present

## 2024-03-03 DIAGNOSIS — I2510 Atherosclerotic heart disease of native coronary artery without angina pectoris: Secondary | ICD-10-CM | POA: Diagnosis not present

## 2024-03-03 DIAGNOSIS — E785 Hyperlipidemia, unspecified: Secondary | ICD-10-CM | POA: Diagnosis not present

## 2024-03-03 DIAGNOSIS — F39 Unspecified mood [affective] disorder: Secondary | ICD-10-CM | POA: Diagnosis not present

## 2024-03-03 DIAGNOSIS — E781 Pure hyperglyceridemia: Secondary | ICD-10-CM | POA: Insufficient documentation

## 2024-03-03 DIAGNOSIS — I4892 Unspecified atrial flutter: Secondary | ICD-10-CM | POA: Diagnosis not present

## 2024-03-03 DIAGNOSIS — I739 Peripheral vascular disease, unspecified: Secondary | ICD-10-CM | POA: Diagnosis present

## 2024-03-03 DIAGNOSIS — F17210 Nicotine dependence, cigarettes, uncomplicated: Secondary | ICD-10-CM | POA: Insufficient documentation

## 2024-03-03 DIAGNOSIS — I4891 Unspecified atrial fibrillation: Secondary | ICD-10-CM | POA: Diagnosis present

## 2024-03-03 DIAGNOSIS — K219 Gastro-esophageal reflux disease without esophagitis: Secondary | ICD-10-CM | POA: Diagnosis not present

## 2024-03-03 DIAGNOSIS — R079 Chest pain, unspecified: Secondary | ICD-10-CM | POA: Diagnosis present

## 2024-03-03 DIAGNOSIS — Z79899 Other long term (current) drug therapy: Secondary | ICD-10-CM | POA: Diagnosis not present

## 2024-03-03 DIAGNOSIS — I10 Essential (primary) hypertension: Secondary | ICD-10-CM | POA: Diagnosis not present

## 2024-03-03 LAB — CBC
HCT: 41.4 % (ref 36.0–46.0)
Hemoglobin: 13.8 g/dL (ref 12.0–15.0)
MCH: 32.2 pg (ref 26.0–34.0)
MCHC: 33.3 g/dL (ref 30.0–36.0)
MCV: 96.7 fL (ref 80.0–100.0)
Platelets: 206 K/uL (ref 150–400)
RBC: 4.28 MIL/uL (ref 3.87–5.11)
RDW: 14.6 % (ref 11.5–15.5)
WBC: 11 K/uL — ABNORMAL HIGH (ref 4.0–10.5)
nRBC: 0 % (ref 0.0–0.2)

## 2024-03-03 LAB — BASIC METABOLIC PANEL WITH GFR
Anion gap: 11 (ref 5–15)
BUN: 15 mg/dL (ref 8–23)
CO2: 26 mmol/L (ref 22–32)
Calcium: 9.9 mg/dL (ref 8.9–10.3)
Chloride: 98 mmol/L (ref 98–111)
Creatinine, Ser: 0.74 mg/dL (ref 0.44–1.00)
GFR, Estimated: >60 mL/min
Glucose, Bld: 124 mg/dL — ABNORMAL HIGH (ref 70–99)
Potassium: 3.8 mmol/L (ref 3.5–5.1)
Sodium: 136 mmol/L (ref 135–145)

## 2024-03-03 LAB — TROPONIN T, HIGH SENSITIVITY
Troponin T High Sensitivity: 17 ng/L (ref 0–19)
Troponin T High Sensitivity: 18 ng/L (ref 0–19)

## 2024-03-03 LAB — MAGNESIUM: Magnesium: 1.3 mg/dL — ABNORMAL LOW (ref 1.7–2.4)

## 2024-03-03 MED ORDER — DILTIAZEM HCL-DEXTROSE 125-5 MG/125ML-% IV SOLN (PREMIX)
5.0000 mg/h | INTRAVENOUS | Status: DC
  Administered 2024-03-03: 5 mg/h via INTRAVENOUS
  Filled 2024-03-03 (×2): qty 125

## 2024-03-03 MED ORDER — DILTIAZEM LOAD VIA INFUSION
15.0000 mg | Freq: Once | INTRAVENOUS | Status: AC
  Administered 2024-03-03: 15 mg via INTRAVENOUS
  Filled 2024-03-03: qty 15

## 2024-03-03 MED ORDER — HEPARIN (PORCINE) 25000 UT/250ML-% IV SOLN
900.0000 [IU]/h | INTRAVENOUS | Status: AC
  Administered 2024-03-03: 900 [IU]/h via INTRAVENOUS
  Filled 2024-03-03: qty 250

## 2024-03-03 MED ORDER — MAGNESIUM SULFATE 50 % IJ SOLN: 2.0000 g | Freq: Once | INTRAMUSCULAR | Status: DC

## 2024-03-03 MED ORDER — OXYCODONE-ACETAMINOPHEN 5-325 MG PO TABS
1.0000 | ORAL_TABLET | Freq: Once | ORAL | Status: AC
  Administered 2024-03-03: 1 via ORAL
  Filled 2024-03-03: qty 1

## 2024-03-03 MED ORDER — MAGNESIUM SULFATE 2 GM/50ML IV SOLN
2.0000 g | Freq: Once | INTRAVENOUS | Status: AC
  Administered 2024-03-03: 2 g via INTRAVENOUS
  Filled 2024-03-03: qty 50

## 2024-03-03 MED ORDER — HEPARIN BOLUS VIA INFUSION
3000.0000 [IU] | Freq: Once | INTRAVENOUS | Status: AC
  Administered 2024-03-03: 3000 [IU] via INTRAVENOUS

## 2024-03-03 NOTE — ED Triage Notes (Signed)
 Pt reports CP beginning @3pm  and has progressively become worse.   +SHOB Denies N/V Hx of MI and has 4 stents

## 2024-03-03 NOTE — Progress Notes (Signed)
 PHARMACY - ANTICOAGULATION CONSULT NOTE  Pharmacy Consult for heparin  Indication: Aflutter  Allergies[1]  Patient Measurements: Height: 5' 7 (170.2 cm) Weight: 61.7 kg (136 lb) IBW/kg (Calculated) : 61.6 HEPARIN  DW (KG): 61.7  Vital Signs: Temp: 98.1 F (36.7 C) (01/20 2154) BP: 143/104 (01/20 2154) Pulse Rate: 120 (01/20 2154)  Labs: Recent Labs    03/03/24 2156  HGB 13.8  HCT 41.4  PLT 206  CREATININE 0.74    Estimated Creatinine Clearance: 51.8 mL/min (by C-G formula based on SCr of 0.74 mg/dL).   Medical History: Past Medical History:  Diagnosis Date   AAA (abdominal aortic aneurysm), small, history of 2.3 X 2.4 07/06/2011   Abnormal ankle brachial index 07/06/2011   Anxiety    CAD (coronary artery disease), with LAD stent in 2006, chronically occluded OM branch with hx. of Stent to RCA  07/06/2011   Claudication in peripheral vascular disease, life style limiting 07/06/2011   Hyperlipidemia LDL goal < 70 09/08/2012   Hypertension    PAD (peripheral artery disease)     Assessment: 84yo female c/o CP radiating to neck and associated w/ SOB, found to be in new-onset Aflutter >> to begin heparin .  Goal of Therapy:  Heparin  level 0.3-0.7 units/ml Monitor platelets by anticoagulation protocol: Yes   Plan:  Heparin  3000 units IV bolus followed by infusion at 900 units/hr. Monitor heparin  levels and CBC.  Marvetta Dauphin, PharmD, BCPS  03/03/2024,10:57 PM      [1] No Known Allergies

## 2024-03-03 NOTE — ED Provider Notes (Signed)
 " Sara Dominguez   CSN: [id number] Arrival date & time: 03/03/24  2137     Patient presents with: Chest Pain   Sara Dominguez is a 84 y.o. female.   Patient is an 84 year old female with a past medical history of CAD, PAD, AAA presenting to the emergency department with chest pain.  The patient states that this afternoon she developed some chest pain that radiated into her neck.  She states that she initially thought it was acid reflux and tried taking Tums but had no relief which prompted her to come to the emergency department.  She states that she was not doing anything when the pain started.  She states that it feels like a pressure type of pain.  She states that she has some mild associated shortness of breath.  Denies any nausea, vomiting or diaphoresis or lower extremity swelling.  She states that she felt a little fatigued when she got up this morning but otherwise has had no recent illnesses.  The history is provided by the patient.  Chest Pain      Prior to Admission medications  Medication Sig Start Date End Date Taking? Authorizing Provider  acetaminophen  (TYLENOL ) 500 MG tablet Take 500-1,000 mg by mouth every 6 (six) hours as needed for mild pain or headache.    [provider]  alendronate (FOSAMAX) 70 MG tablet Take 70 mg by mouth every Wednesday. Take with a full glass of water on an empty stomach.    [provider]  atorvastatin  (LIPITOR) 80 MG tablet TAKE 1 TABLET EVERY DAY 02/24/24   Court Dorn PARAS, MD  Cholecalciferol  (VITAMIN D-3) 25 MCG (1000 UT) CAPS Take 2,000 Units by mouth daily.    [provider]  ciprofloxacin  (CIPRO ) 500 MG tablet Take 1 tablet (500 mg total) by mouth every 12 (twelve) hours. 07/13/23   Ula Prentice SAUNDERS, MD  clopidogrel  (PLAVIX ) 75 MG tablet TAKE 1 TABLET EVERY DAY 04/24/23   Court Dorn PARAS, MD  cyanocobalamin  (VITAMIN B12) 1000 MCG tablet Take 1,000 mcg  by mouth daily.    [provider]  DULoxetine  (CYMBALTA ) 30 MG capsule TAKE 1 CAPSULE BY MOUTH EVERY DAY 01/21/23   Onita Duos, MD  ferrous sulfate  325 (65 FE) MG tablet Take 325 mg by mouth every Wednesday.     [provider]  gabapentin  (NEURONTIN ) 100 MG capsule TAKE 3 CAPSULES BY MOUTH 3 TIMES DAILY. 11/12/23   Onita Duos, MD  hydrALAZINE  (APRESOLINE ) 10 MG tablet Take 10 mg by mouth 2 (two) times daily.    [provider]  hydrochlorothiazide (HYDRODIURIL) 12.5 MG tablet Take 12.5 mg by mouth daily.    [provider]  losartan  (COZAAR ) 100 MG tablet Take 1 tablet (100 mg total) by mouth daily. 05/06/15   Court Dorn PARAS, MD  Omega-3 Fatty Acids (FISH OIL) 1000 MG CAPS Take 1,000 mg by mouth daily.     [provider]  pantoprazole  (PROTONIX ) 40 MG tablet Take 40 mg by mouth daily. 07/06/19   [provider]    Allergies: Patient has no known allergies.    Review of Systems  Cardiovascular:  Positive for chest pain.    Updated Vital Signs BP (!) 143/104   Pulse (!) 120   Temp 98.1 F (36.7 C)   Resp (!) 22   Ht 5' 7 (1.702 m)   Wt 61.7 kg   SpO2 97%   BMI 21.30 kg/m  Physical Exam Vitals and nursing Dominguez reviewed.  Constitutional:      General: She is not in acute distress.    Appearance: She is well-developed.  HENT:     Head: Normocephalic and atraumatic.  Eyes:     Extraocular Movements: Extraocular movements intact.  Cardiovascular:     Rate and Rhythm: Tachycardia present. Rhythm irregular.     Pulses:          Radial pulses are 2+ on the right side and 2+ on the left side.     Heart sounds: Normal heart sounds.  Pulmonary:     Effort: Pulmonary effort is normal.     Breath sounds: Normal breath sounds.  Abdominal:     Palpations: Abdomen is soft.     Tenderness: There is no abdominal tenderness.  Musculoskeletal:        General: Normal range of motion.     Cervical back: Normal range of motion and  neck supple.     Right lower leg: No edema.     Left lower leg: No edema.  Skin:    General: Skin is warm and dry.  Neurological:     General: No focal deficit present.     Mental Status: She is alert and oriented to person, place, and time.  Psychiatric:        Mood and Affect: Mood normal.        Behavior: Behavior normal.     (all labs ordered are listed, but only abnormal results are displayed) Labs Reviewed  BASIC METABOLIC PANEL WITH GFR - Abnormal; Notable for the following components:      Result Value   Glucose, Bld 124 (*)    All other components within normal limits  CBC - Abnormal; Notable for the following components:   WBC 11.0 (*)    All other components within normal limits  MAGNESIUM  - Abnormal; Notable for the following components:   Magnesium  1.3 (*)    All other components within normal limits  HEPARIN  LEVEL (UNFRACTIONATED)  TROPONIN T, HIGH SENSITIVITY    EKG: None  Radiology: [facility] Chest Port 1 View Result Date: 03/03/2024 EXAM: 2 Views XRAY of the Chest 03/03/2024 10:15:00 PM COMPARISON: CT 08/03/2021. CLINICAL HISTORY: 355200 Chest pain 644799 cp cp 355200 Chest pain 644799 FINDINGS: LUNGS AND PLEURA: Nodular densities project over both upper lobes, new since prior study. No pleural effusion. No pneumothorax. HEART AND MEDIASTINUM: No acute abnormality of the cardiac and mediastinal silhouettes. Aortic atherosclerosis. BONES AND SOFT TISSUES: No acute osseous abnormality. IMPRESSION: 1. Nodular densities project over both upper lobes, new since prior study. This could be further evaluated with chest CT. Electronically signed by: Franky Crease MD 03/03/2024 10:19 PM EST RP Workstation: [id number]     .Critical Care  Performed by: Kingsley, Elloise Roark K, DO Authorized by: Ellouise Richerd POUR, DO   Critical care provider statement:    Critical care time (minutes):  30   Critical care was time spent personally by me on the following activities:  Development  of treatment plan with patient or surrogate, discussions with consultants, evaluation of patient's response to treatment, examination of patient, ordering and review of laboratory studies, ordering and review of radiographic studies, ordering and performing treatments and interventions, pulse oximetry, re-evaluation of patient's condition and review of old charts    Medications Ordered in the ED  diltiazem  (CARDIZEM ) 1 mg/mL load via infusion 15 mg (0 mg Intravenous Hold 03/03/24 2258)    And  diltiazem  (CARDIZEM )  125 mg in dextrose  5% 125 mL (1 mg/mL) infusion (5 mg/hr Intravenous New Bag/Given 03/03/24 2313)  heparin  bolus via infusion 3,000 Units (has no administration in time range)  heparin  ADULT infusion 100 units/mL (25000 units/250mL) (has no administration in time range)  magnesium  sulfate IVPB 2 g 50 mL (2 g Intravenous New Bag/Given 03/03/24 2309)  oxyCODONE -acetaminophen  (PERCOCET/ROXICET) 5-325 MG per tablet 1 tablet (1 tablet Oral Given 03/03/24 2254)    Clinical Course as of 03/03/24 2315  Tue Mar 03, 2024  2239 Troponin negative, with high risk chest pain and new onset atrial flutter will consult cards for dispo recs.  [VK]  2241 I spoke with Dr. Janeth cardiology fellow who recommends admission to medicine for high risk chest pain, can start heparin  gtt with new onset atrial flutter. Can hold off on rate control with rate currently in 100's. Mag slightly low, will be repleted.  [VK]  2253 On my reassessment, patient's HR spiked up to 140's. Diltiazem  ordered, however HR improved back to 100's prior to starting. Will hold off unless she's persistently elevated. Will consult hospitalist for admission.  [VK]    Clinical Course User Index [VK] Kingsley, Ichael Pullara K, DO                                 Medical Decision Making This patient presents to the ED with chief complaint(s) of chest pain with pertinent past medical history of CAD, PAD, AAA which further complicates the presenting  complaint. The complaint involves an extensive differential diagnosis and also carries with it a high risk of complications and morbidity.    The differential diagnosis includes ACS, arrhythmia, anemia, pneumonia, pneumothorax, pulmonary edema, pleural effusion, MSK pain, GERD  Additional history obtained: Additional history obtained from spouse Records reviewed outpatient cardiology records  ED Course and Reassessment: On patient's arrival she is tachycardic and otherwise hemodynamically stable in no acute distress.  EKG performed on arrival showed new onset atrial flutter without acute ischemic changes.  Patient had labs and chest x-ray initiated in triage that are pending at this time.  Patient's rate is currently controlled in the 100s and she will be closely reassessed.  Independent labs interpretation:  The following labs were independently interpreted: Mild hypomagnesemia otherwise within normal range  Independent visualization of imaging: - I independently visualized the following imaging with scope of interpretation limited to determining acute life threatening conditions related to emergency care: Chest x-ray, which revealed pulmonary nodules, otherwise no acute findings  Consultation: - Consulted or discussed management/test interpretation w/ external professional: Cardiology, hospitalist  Consideration for admission or further workup: Patient requires admission for new onset atrial flutter in the setting of high risk chest pain Social Determinants of health: N/A    Amount and/or Complexity of Data Reviewed Labs: ordered. Radiology: ordered.  Risk Prescription drug management. Decision regarding hospitalization.       Final diagnoses:  New onset atrial flutter (HCC)  Chest pain, unspecified type    ED Discharge Orders     None          Kingsley, Joylyn Duggin K, DO 03/03/24 2315  "

## 2024-03-04 ENCOUNTER — Encounter (HOSPITAL_COMMUNITY): Payer: Self-pay | Admitting: Internal Medicine

## 2024-03-04 DIAGNOSIS — I2510 Atherosclerotic heart disease of native coronary artery without angina pectoris: Secondary | ICD-10-CM

## 2024-03-04 DIAGNOSIS — I4891 Unspecified atrial fibrillation: Secondary | ICD-10-CM | POA: Diagnosis present

## 2024-03-04 DIAGNOSIS — I4892 Unspecified atrial flutter: Secondary | ICD-10-CM | POA: Diagnosis present

## 2024-03-04 DIAGNOSIS — I739 Peripheral vascular disease, unspecified: Secondary | ICD-10-CM

## 2024-03-04 DIAGNOSIS — R079 Chest pain, unspecified: Secondary | ICD-10-CM | POA: Diagnosis present

## 2024-03-04 DIAGNOSIS — I10 Essential (primary) hypertension: Secondary | ICD-10-CM | POA: Diagnosis not present

## 2024-03-04 DIAGNOSIS — Z743 Need for continuous supervision: Secondary | ICD-10-CM | POA: Diagnosis not present

## 2024-03-04 LAB — HEPARIN LEVEL (UNFRACTIONATED): Heparin Unfractionated: 0.37 [IU]/mL (ref 0.30–0.70)

## 2024-03-04 LAB — MAGNESIUM: Magnesium: 1.9 mg/dL (ref 1.7–2.4)

## 2024-03-04 LAB — SEDIMENTATION RATE: Sed Rate: 11 mm/h (ref 0–22)

## 2024-03-04 LAB — TSH: TSH: 1.21 u[IU]/mL (ref 0.350–4.500)

## 2024-03-04 LAB — C-REACTIVE PROTEIN: CRP: 15.3 mg/dL — ABNORMAL HIGH

## 2024-03-04 LAB — D-DIMER, QUANTITATIVE: D-Dimer, Quant: 1.46 ug{FEU}/mL — ABNORMAL HIGH (ref 0.00–0.50)

## 2024-03-04 MED ORDER — GABAPENTIN 100 MG PO CAPS
200.0000 mg | ORAL_CAPSULE | Freq: Three times a day (TID) | ORAL | Status: DC
  Administered 2024-03-04 – 2024-03-07 (×9): 200 mg via ORAL
  Filled 2024-03-04 (×10): qty 2

## 2024-03-04 MED ORDER — OXYCODONE HCL 5 MG PO TABS
5.0000 mg | ORAL_TABLET | ORAL | Status: DC | PRN
  Administered 2024-03-04 – 2024-03-05 (×4): 5 mg via ORAL
  Filled 2024-03-04 (×4): qty 1

## 2024-03-04 MED ORDER — SODIUM CHLORIDE 0.9 % IV SOLN: 250.0000 mL | INTRAVENOUS | Status: AC | PRN

## 2024-03-04 MED ORDER — APIXABAN 5 MG PO TABS
5.0000 mg | ORAL_TABLET | Freq: Two times a day (BID) | ORAL | Status: DC
  Administered 2024-03-04 – 2024-03-07 (×6): 5 mg via ORAL
  Filled 2024-03-04 (×6): qty 1

## 2024-03-04 MED ORDER — VITAMIN B-12 1000 MCG PO TABS
1000.0000 ug | ORAL_TABLET | Freq: Every day | ORAL | Status: DC
  Administered 2024-03-04 – 2024-03-07 (×4): 1000 ug via ORAL
  Filled 2024-03-04 (×4): qty 1

## 2024-03-04 MED ORDER — ACETAMINOPHEN 650 MG RE SUPP: 650.0000 mg | Freq: Four times a day (QID) | RECTAL | Status: DC | PRN

## 2024-03-04 MED ORDER — DILTIAZEM HCL 30 MG PO TABS
30.0000 mg | ORAL_TABLET | Freq: Three times a day (TID) | ORAL | Status: DC
  Administered 2024-03-04 – 2024-03-07 (×9): 30 mg via ORAL
  Filled 2024-03-04 (×11): qty 1

## 2024-03-04 MED ORDER — BISACODYL 10 MG RE SUPP: 10.0000 mg | Freq: Every day | RECTAL | Status: DC | PRN

## 2024-03-04 MED ORDER — LIDOCAINE 5 % EX PTCH
1.0000 | MEDICATED_PATCH | CUTANEOUS | Status: DC
  Administered 2024-03-04 – 2024-03-07 (×4): 1 via TRANSDERMAL
  Filled 2024-03-04 (×4): qty 1

## 2024-03-04 MED ORDER — HYDRALAZINE HCL 20 MG/ML IJ SOLN: 10.0000 mg | Freq: Four times a day (QID) | INTRAMUSCULAR | Status: DC | PRN

## 2024-03-04 MED ORDER — ACETAMINOPHEN 325 MG PO TABS: 650.0000 mg | ORAL_TABLET | Freq: Four times a day (QID) | ORAL | Status: DC | PRN

## 2024-03-04 MED ORDER — ACETAMINOPHEN 500 MG PO TABS
1000.0000 mg | ORAL_TABLET | Freq: Three times a day (TID) | ORAL | Status: DC
  Administered 2024-03-04 – 2024-03-07 (×9): 1000 mg via ORAL
  Filled 2024-03-04 (×9): qty 2

## 2024-03-04 MED ORDER — MORPHINE SULFATE (PF) 4 MG/ML IV SOLN
4.0000 mg | Freq: Once | INTRAVENOUS | Status: AC
  Administered 2024-03-04: 4 mg via INTRAVENOUS
  Filled 2024-03-04: qty 1

## 2024-03-04 MED ORDER — POLYETHYLENE GLYCOL 3350 17 G PO PACK
17.0000 g | PACK | Freq: Every day | ORAL | Status: DC
  Administered 2024-03-05 – 2024-03-07 (×3): 17 g via ORAL
  Filled 2024-03-04 (×3): qty 1

## 2024-03-04 MED ORDER — SENNA 8.6 MG PO TABS
1.0000 | ORAL_TABLET | Freq: Two times a day (BID) | ORAL | Status: DC
  Administered 2024-03-04 – 2024-03-07 (×6): 8.6 mg via ORAL
  Filled 2024-03-04 (×7): qty 1

## 2024-03-04 MED ORDER — MORPHINE SULFATE (PF) 2 MG/ML IV SOLN
1.0000 mg | INTRAVENOUS | Status: DC | PRN
  Administered 2024-03-06: 1 mg via INTRAVENOUS
  Filled 2024-03-04: qty 1

## 2024-03-04 MED ORDER — ONDANSETRON HCL 4 MG PO TABS: 4.0000 mg | ORAL_TABLET | Freq: Four times a day (QID) | ORAL | Status: DC | PRN

## 2024-03-04 MED ORDER — PANTOPRAZOLE SODIUM 40 MG PO TBEC
40.0000 mg | DELAYED_RELEASE_TABLET | Freq: Two times a day (BID) | ORAL | Status: DC
  Administered 2024-03-04 – 2024-03-07 (×6): 40 mg via ORAL
  Filled 2024-03-04 (×6): qty 1

## 2024-03-04 MED ORDER — ONDANSETRON HCL 4 MG/2ML IJ SOLN
4.0000 mg | Freq: Four times a day (QID) | INTRAMUSCULAR | Status: DC | PRN
  Administered 2024-03-04: 4 mg via INTRAVENOUS
  Filled 2024-03-04: qty 2

## 2024-03-04 MED ORDER — SODIUM CHLORIDE 0.9% FLUSH
3.0000 mL | Freq: Two times a day (BID) | INTRAVENOUS | Status: DC
  Administered 2024-03-04 – 2024-03-06 (×5): 3 mL via INTRAVENOUS

## 2024-03-04 MED ORDER — SODIUM CHLORIDE 0.9% FLUSH: 3.0000 mL | INTRAVENOUS | Status: DC | PRN

## 2024-03-04 MED ORDER — MAGNESIUM SULFATE IN D5W 1-5 GM/100ML-% IV SOLN
1.0000 g | Freq: Once | INTRAVENOUS | Status: AC
  Administered 2024-03-04: 1 g via INTRAVENOUS
  Filled 2024-03-04: qty 100

## 2024-03-04 NOTE — H&P (Addendum)
 " History and Physical   Referring Provider: Dr Richerd Later Telemedicine Provider: Donalda Applebaum MD Provider Location: [facility] Patient Location: [facility] Referring Diagnosis: Afib RVR Patient Name and DOB verified: yes Patient consented to Telemedicine Evaluation:yes RN virtual assistant: Daphne Rend Video encounter time and date:03/04/24 at 6:55 am   Patient: Sara Dominguez FMW:[id number] DOB: 08-03-1940 DOA: 03/03/2024 DOS: the patient was seen and examined on 03/04/2024 PCP: Cleotilde Planas, MD    Chief Complaint:  Chief Complaint  Patient presents with   Chest Pain   HPI: Sara Dominguez is a 84 y.o. female with medical history significant of PAD-s/p PCI to SFA, CAD-s/p multiple PCI, HTN, HLD who presented to the ED with chest pain.  Per patient-she started having left-sided chest pain yesterday afternoon around 2 PM-she initially thought this was acid reflux and tried taking some Tums-but did not get any better-unfortunately her pain started gradually worsening-and was rated around 9/10-which prompted her to come to the emergency department.  Per patient-pain is sharp-in the left lower part of her chest-and moves around-and at times has gotten up to her neck.  Pain is easily reproducible by gentle palpation, and also when patient takes a deep breath at times.  Per patient-this is unlike the pain she had when she had her myocardial infarction in the past.  There is no nausea, vomiting or diaphoresis-she does acknowledge having some mild associated shortness of breath when she has pain.  She has had some relief with narcotics that was given in the ED.    Upon arrival to the ED she was found to be tachycardic-EKG showed A-fib RVR, troponins were negative-cardiology was consulted-patient was started on Cardizem  infusion/heparin  infusion-hospitalist service was asked to admit this patient for further evaluation and treatment.  Patient denies any fever or  headache Denies any nausea, vomiting or diarrhea Denies any abdominal pain There is no history of hematuria/hematochezia/hematemesis  Review of Systems: As mentioned in the history of present illness. All other systems reviewed and are negative. Past Medical History:  Diagnosis Date   AAA (abdominal aortic aneurysm), small, history of 2.3 X 2.4 07/06/2011   Abnormal ankle brachial index 07/06/2011   Anxiety    CAD (coronary artery disease), with LAD stent in 2006, chronically occluded OM branch with hx. of Stent to RCA  07/06/2011   Claudication in peripheral vascular disease, life style limiting 07/06/2011   Hyperlipidemia LDL goal < 70 09/08/2012   Hypertension    PAD (peripheral artery disease)    Past Surgical History:  Procedure Laterality Date   ABDOMINAL AORTAGRAM N/A 07/05/2011   Procedure: ABDOMINAL EZELLA;  Surgeon: Dorn JINNY Lesches, MD;  Location: [facility] CATH LAB;  Service: Cardiovascular;  Laterality: N/A;   ABDOMINAL HYSTERECTOMY  1992   CATARACT EXTRACTION W/ INTRAOCULAR LENS  IMPLANT, BILATERAL Bilateral ~ 2006   CORONARY ANGIOPLASTY WITH STENT PLACEMENT  2006; 2009; 2013   1 + 1 + 2;  total of 4   ENTEROSCOPY N/A 08/29/2017   Procedure: ENTEROSCOPY;  Surgeon: Donnald Charleston, MD;  Location: [facility] ENDOSCOPY;  Service: Endoscopy;  Laterality: N/A;   ESOPHAGOGASTRODUODENOSCOPY (EGD) WITH PROPOFOL  N/A 06/07/2016   Procedure: ESOPHAGOGASTRODUODENOSCOPY (EGD) WITH PROPOFOL ;  Surgeon: Charleston Donnald, MD;  Location: [facility] ENDOSCOPY;  Service: Endoscopy;  Laterality: N/A;   FEMORAL ARTERY STENT Right 2006; 07/05/2011; 05/14/2013   FEMORAL ARTERY STENT Right 05/14/13   between previous stens   HOT HEMOSTASIS N/A 08/29/2017   Procedure: HOT HEMOSTASIS (ARGON PLASMA COAGULATION/BICAP);  Surgeon: Donnald Charleston, MD;  Location: [facility] ENDOSCOPY;  Service: Endoscopy;  Laterality: N/A;   LOWER EXTREMITY ANGIOGRAM N/A 07/05/2011   Procedure: LOWER EXTREMITY ANGIOGRAM;  Surgeon: Dorn JINNY Lesches, MD;   Location: [facility] CATH LAB;  Service: Cardiovascular;  Laterality: N/A;   LOWER EXTREMITY ANGIOGRAM N/A 05/14/2013   Procedure: LOWER EXTREMITY ANGIOGRAM;  Surgeon: Dorn JINNY Lesches, MD;  Location: [facility] CATH LAB;  Service: Cardiovascular;  Laterality: N/A;   LUMBAR DISC SURGERY  1980;; 1982   PERCUTANEOUS STENT INTERVENTION Right 07/05/2011   Procedure: PERCUTANEOUS STENT INTERVENTION;  Surgeon: Dorn JINNY Lesches, MD;  Location: [facility] CATH LAB;  Service: Cardiovascular;  Laterality: Right;   PERIPHERAL VASCULAR CATHETERIZATION N/A 11/04/2014   Procedure: Lower Extremity Angiography;  Surgeon: Dorn JINNY Lesches, MD;  Location: [facility] INVASIVE CV LAB;  Service: Cardiovascular;  Laterality: N/A;   PERIPHERAL VASCULAR CATHETERIZATION Right 11/04/2014   Procedure: Peripheral Vascular Intervention;  Surgeon: Dorn JINNY Lesches, MD;  Location: [facility] INVASIVE CV LAB;  Service: Cardiovascular;  Laterality: Right;  SFA   SHOULDER OPEN ROTATOR CUFF REPAIR Left ~ 2004   Social History:  reports that she has been smoking cigarettes. She started smoking about 63 years ago. She has a 50 pack-year smoking history. She has never used smokeless tobacco. She reports current alcohol use of about 3.0 standard drinks of alcohol per week. She reports that she does not use drugs.  Allergies[1]  Family History  Problem Relation Age of Onset   Cancer Mother 12   Heart attack Father        died 48   Parkinson's disease Sister    Heart disease Sister    Neuropathy Sister    Heart attack Maternal Grandmother    Heart attack Maternal Grandfather 50       died at 15   Stroke Paternal Grandfather    Deep vein thrombosis Daughter     Prior to Admission medications  Medication Sig Start Date End Date Taking? Authorizing Provider  acetaminophen  (TYLENOL ) 500 MG tablet Take 500-1,000 mg by mouth every 6 (six) hours as needed for mild pain or headache.    [provider]  alendronate (FOSAMAX) 70 MG tablet Take 70 mg by mouth every  Wednesday. Take with a full glass of water on an empty stomach.    [provider]  atorvastatin  (LIPITOR) 80 MG tablet TAKE 1 TABLET EVERY DAY 02/24/24   Lesches Dorn JINNY, MD  Cholecalciferol  (VITAMIN D-3) 25 MCG (1000 UT) CAPS Take 2,000 Units by mouth daily.    [provider]  ciprofloxacin  (CIPRO ) 500 MG tablet Take 1 tablet (500 mg total) by mouth every 12 (twelve) hours. 07/13/23   Ula Prentice SAUNDERS, MD  clopidogrel  (PLAVIX ) 75 MG tablet TAKE 1 TABLET EVERY DAY 04/24/23   Lesches Dorn JINNY, MD  cyanocobalamin  (VITAMIN B12) 1000 MCG tablet Take 1,000 mcg by mouth daily.    [provider]  DULoxetine  (CYMBALTA ) 30 MG capsule TAKE 1 CAPSULE BY MOUTH EVERY DAY 01/21/23   Onita Duos, MD  ferrous sulfate  325 (65 FE) MG tablet Take 325 mg by mouth every Wednesday.     [provider]  gabapentin  (NEURONTIN ) 100 MG capsule TAKE 3 CAPSULES BY MOUTH 3 TIMES DAILY. 11/12/23   Onita Duos, MD  hydrALAZINE  (APRESOLINE ) 10 MG tablet Take 10 mg by mouth 2 (two) times daily.    [provider]  hydrochlorothiazide (HYDRODIURIL) 12.5 MG tablet Take 12.5 mg by mouth daily.    [provider]  losartan  (COZAAR ) 100  MG tablet Take 1 tablet (100 mg total) by mouth daily. 05/06/15   Court Dorn PARAS, MD  Omega-3 Fatty Acids (FISH OIL) 1000 MG CAPS Take 1,000 mg by mouth daily.     [provider]  pantoprazole  (PROTONIX ) 40 MG tablet Take 40 mg by mouth daily. 07/06/19   [provider]    Physical Exam: Note-physical exam was done by RN at bedside Vitals:   03/04/24 0445 03/04/24 0500 03/04/24 0515 03/04/24 0615  BP: 136/66   124/66  Pulse: 99 99 86 96  Resp: (!) 25 20 (!) 22 (!) 22  Temp:      SpO2: 93% 93% 91% 92%  Weight:      Height:       Gen Exam:Alert awake-not in any distress HEENT:atraumatic, normocephalic Chest: B/L clear to auscultation anteriorly.+ Left lower chest wall tenderness-pain reproducible. CVS:S1S2  regular Abdomen:soft non tender, non distended Extremities:no edema Neurology: Non focal Skin: no rash  Data Reviewed:    Latest Ref Rng & Units 03/03/2024    9:56 PM 02/19/2024    8:17 AM 07/13/2023   12:47 PM  CBC  WBC 4.0 - 10.5 K/uL 11.0  9.1  10.6   Hemoglobin 12.0 - 15.0 g/dL 86.1  86.8  87.6   Hematocrit 36.0 - 46.0 % 41.4  40.4  37.6   Platelets 150 - 400 K/uL 206  241  196         Latest Ref Rng & Units 03/03/2024    9:56 PM 02/19/2024    8:17 AM 07/13/2023   12:47 PM  BMP  Glucose 70 - 99 mg/dL 875  78  97   BUN 8 - 23 mg/dL 15  15  12    Creatinine 0.44 - 1.00 mg/dL 9.25  9.21  9.23   BUN/Creat Ratio 12 - 28  19    Sodium 135 - 145 mmol/L 136  141  141   Potassium 3.5 - 5.1 mmol/L 3.8  3.8  3.0   Chloride 98 - 111 mmol/L 98  105  102   CO2 22 - 32 mmol/L 26  23  26    Calcium  8.9 - 10.3 mg/dL 9.9  9.1  8.1     Twelve-lead EKG: A-fib/flutter with RVR  Chest x-ray: No obvious infiltrates   Assessment and Plan: A-fib/flutter with RVR Continue Cardizem /heparin  infusion Will admit to a progressive care unit Check echo/TSH Cardiology evaluation when patient [facility].  Chest pain Appears reproducible-somewhat pleuritic-suspect this could be musculoskeletal Thankfully troponins negative Will try pain control with scheduled Tylenol , transdermal Lidoderm -and use narcotics as needed  History of CAD See above Holding Plavix  as patient is on heparin  Currently on Cardizem  infusion-once rate better controlled-could consider starting beta-blocker  HTN Currently BP stable on Cardizem  infusion HCTZ/losartan /hydralazine  on hold  PAD She is followed by Dr. Loyola has a prior history of multiple intervention-most recently done in 2018.  She was most recently seen by Dr. Court and per his note-patient is scheduled to see Dr. Darron for potential revascularization/endovascular therapy on her distal left SFA She currently denies any claudication  pain.  HLD Statin  GERD PPI  Mood disorder Apparently does not take Cymbalta -though it is on her medication list-await medication reconciliation  Neuropathy Apparently is on Neurontin  which will be continued.   Advance Care Planning:   Code Status: Full Code   Consults: Cardiology  Family Communication: None at bedside  Severity of Illness: The appropriate patient status for this patient is  INPATIENT. Inpatient status is judged to be reasonable and necessary in order to provide the required intensity of service to ensure the patient's safety. The patient's presenting symptoms, physical exam findings, and initial radiographic and laboratory data in the context of their chronic comorbidities is felt to place them at high risk for further clinical deterioration. Furthermore, it is not anticipated that the patient will be medically stable for discharge from the hospital within 2 midnights of admission.   * I certify that at the point of admission it is my clinical judgment that the patient will require inpatient hospital care spanning beyond 2 midnights from the point of admission due to high intensity of service, high risk for further deterioration and high frequency of surveillance required.*  Author: Donalda Applebaum, MD 03/04/2024 7:24 AM  For on call review [email or web address].      [1] No Known Allergies  "

## 2024-03-04 NOTE — Progress Notes (Signed)
 PHARMACY - ANTICOAGULATION CONSULT NOTE  Pharmacy Consult for heparin  Indication: Aflutter  Allergies[1]  Patient Measurements: Height: 5' 6 (167.6 cm) Weight: 61.7 kg (136 lb) IBW/kg (Calculated) : 59.3 HEPARIN  DW (KG): 61.7  Vital Signs: Temp: 98.1 F (36.7 C) (01/21 1633) Temp Source: Oral (01/21 1633) BP: 117/85 (01/21 1633) Pulse Rate: 107 (01/21 1633)  Labs: Recent Labs    03/03/24 2156 03/04/24 0940  HGB 13.8  --   HCT 41.4  --   PLT 206  --   HEPARINUNFRC  --  0.37  CREATININE 0.74  --     Estimated Creatinine Clearance: 49.9 mL/min (by C-G formula based on SCr of 0.74 mg/dL).  Assessment: 84yo female c/o CP radiating to neck and associated w/ SOB, found to be in new-onset Aflutter. Heparin  was started.Pharmacy has been consulted to transition to apixaban .   Borderline for dose reduction with wt 61.7 kg, age >21 yo.   Plan:  STOP heparin   START apixaban  5 mg PO BID- administer first dose at the same time the heparin  infusion is stopped  CBC in AM Will need education prior to discharge and copay check, AVS posted   Massie Fila, PharmD Clinical Pharmacist  03/04/2024 4:43 PM         [1] No Known Allergies

## 2024-03-04 NOTE — ED Notes (Signed)
 Thomas with cl called for transport

## 2024-03-04 NOTE — Plan of Care (Signed)

## 2024-03-04 NOTE — Consult Note (Signed)
 "  Cardiology Consultation   Patient ID: Sara Dominguez MRN: [id number]; DOB: Jul 25, 1940  Admit date: 03/03/2024 Date of Consult: 03/04/2024  PCP:  Cleotilde Planas, MD   [facility] Providers Cardiologist:  [name], MD       Patient Profile: Sara Dominguez is a 84 y.o. female with a hx of hypertension, hyperlipidemia, tobacco abuse, carotid artery stenosis, history of GI bleed, AAA 3.9 cm, PVCs, PACs, atrial tachycardia, carotid artery disease, PAD with a remote history of percutaneous revascularization of the right SFA, and CAD with a remote history of PCI, who is being seen 03/04/2024 for the evaluation of atrial fibrillation at the request of Raenelle Grew MD.  History of Present Illness: Sara Dominguez is an 84 year old female with prior cardiac history listed below.  Has a history of CAD and received a stent to the LAD in 2006, and a drug-eluting stent to the mid RCA in 2011.  Has a history of PAD and received a stent to the right SFA in 2013.  In 2015 the patient received 2 stents in the right SFA.  In 2016 an additional stent was placed on the right SFA.  Arterial Doppler studies in 2018 indicated patent stents.  Doppler studies on 12/2022 indicated stenosis in the right external iliac and distal left SFA.  TTE on 06/2017 showed a normal LVEF of 55 to 60%, mild concentric LVH, mildly dilated aortic root,  and mild to moderate mitral regurgitation.  The patient was seen by Dr. Waddell on 05/2022 and felt to be a poor candidate for an ablation.  Her PACs and PVCs were recommended to be treated using a beta-blocker or calcium  channel blocker.  Doppler ultrasound on 12/2023 showed moderate left ICA stenosis.  Patient is scheduled for a lower extremity angiography with Dr. Darron on 03/13/2024.  Patient presented to the hospital on 03/03/24 for concerns of chest pain. Was found to be in atrial flutter with RVR.  She was started on IV Cardizem .  On interview the patient  reported that she had chest pain, and shortness of breath that started yesterday.  Reported that she has been smoking about 5 cigarettes a day for the past 50 years.  Drinks two 4 ounce glasses of wine about 2 days a week.  Denies any fever, chills, melena, hematuria, hematochezia.  Denies any snoring.  Labs showed potassium of 3.8, creatinine of 0.74, BUN of 11, magnesium  of 1.9, high-sensitivity troponins of 17 > 18, WBC elevated at 11, and hemoglobin of 13.8  Chest x-ray showed  Nodular densities project over both upper lobes, new since prior study. This could be further evaluated with chest CT  EKG showed atrial flutter with a rate of 106  Echo pending  Past Medical History:  Diagnosis Date   AAA (abdominal aortic aneurysm), small, history of 2.3 X 2.4 07/06/2011   Abnormal ankle brachial index 07/06/2011   Anxiety    CAD (coronary artery disease), with LAD stent in 2006, chronically occluded OM branch with hx. of Stent to RCA  07/06/2011   Claudication in peripheral vascular disease, life style limiting 07/06/2011   Hyperlipidemia LDL goal < 70 09/08/2012   Hypertension    PAD (peripheral artery disease)     Past Surgical History:  Procedure Laterality Date   ABDOMINAL AORTAGRAM N/A 07/05/2011   Procedure: ABDOMINAL EZELLA;  Surgeon: Dorn JINNY Lesches, MD;  Location: [facility] CATH LAB;  Service: Cardiovascular;  Laterality: N/A;   ABDOMINAL HYSTERECTOMY  1992   CATARACT  EXTRACTION W/ INTRAOCULAR LENS  IMPLANT, BILATERAL Bilateral ~ 2006   CORONARY ANGIOPLASTY WITH STENT PLACEMENT  2006; 2009; 2013   1 + 1 + 2;  total of 4   ENTEROSCOPY N/A 08/29/2017   Procedure: ENTEROSCOPY;  Surgeon: Donnald Charleston, MD;  Location: [facility] ENDOSCOPY;  Service: Endoscopy;  Laterality: N/A;   ESOPHAGOGASTRODUODENOSCOPY (EGD) WITH PROPOFOL  N/A 06/07/2016   Procedure: ESOPHAGOGASTRODUODENOSCOPY (EGD) WITH PROPOFOL ;  Surgeon: Charleston Donnald, MD;  Location: [facility];  Service: Endoscopy;  Laterality: N/A;    FEMORAL ARTERY STENT Right 2006; 07/05/2011; 05/14/2013   FEMORAL ARTERY STENT Right 05/14/13   between previous stens   HOT HEMOSTASIS N/A 08/29/2017   Procedure: HOT HEMOSTASIS (ARGON PLASMA COAGULATION/BICAP);  Surgeon: Donnald Charleston, MD;  Location: [facility];  Service: Endoscopy;  Laterality: N/A;   LOWER EXTREMITY ANGIOGRAM N/A 07/05/2011   Procedure: LOWER EXTREMITY ANGIOGRAM;  Surgeon: Dorn JINNY Lesches, MD;  Location: [facility] CATH LAB;  Service: Cardiovascular;  Laterality: N/A;   LOWER EXTREMITY ANGIOGRAM N/A 05/14/2013   Procedure: LOWER EXTREMITY ANGIOGRAM;  Surgeon: Dorn JINNY Lesches, MD;  Location: [facility] CATH LAB;  Service: Cardiovascular;  Laterality: N/A;   LUMBAR DISC SURGERY  1980;; 1982   PERCUTANEOUS STENT INTERVENTION Right 07/05/2011   Procedure: PERCUTANEOUS STENT INTERVENTION;  Surgeon: Dorn JINNY Lesches, MD;  Location: [facility] CATH LAB;  Service: Cardiovascular;  Laterality: Right;   PERIPHERAL VASCULAR CATHETERIZATION N/A 11/04/2014   Procedure: Lower Extremity Angiography;  Surgeon: Dorn JINNY Lesches, MD;  Location: [facility] INVASIVE CV LAB;  Service: Cardiovascular;  Laterality: N/A;   PERIPHERAL VASCULAR CATHETERIZATION Right 11/04/2014   Procedure: Peripheral Vascular Intervention;  Surgeon: Dorn JINNY Lesches, MD;  Location: [facility] INVASIVE CV LAB;  Service: Cardiovascular;  Laterality: Right;  SFA   SHOULDER OPEN ROTATOR CUFF REPAIR Left ~ 2004     Home Medications:  Prior to Admission medications  Medication Sig Start Date End Date Taking? Authorizing Provider  acetaminophen  (TYLENOL ) 500 MG tablet Take 500-1,000 mg by mouth every 6 (six) hours as needed for mild pain or headache.    [provider]  alendronate (FOSAMAX) 70 MG tablet Take 70 mg by mouth every Wednesday. Take with a full glass of water on an empty stomach.    [provider]  atorvastatin  (LIPITOR) 80 MG tablet TAKE 1 TABLET EVERY DAY 02/24/24   Lesches Dorn JINNY, MD  Cholecalciferol  (VITAMIN D-3) 25 MCG (1000  UT) CAPS Take 2,000 Units by mouth daily.    [provider]  ciprofloxacin  (CIPRO ) 500 MG tablet Take 1 tablet (500 mg total) by mouth every 12 (twelve) hours. 07/13/23   Ula Prentice SAUNDERS, MD  clopidogrel  (PLAVIX ) 75 MG tablet TAKE 1 TABLET EVERY DAY 04/24/23   Lesches Dorn JINNY, MD  cyanocobalamin  (VITAMIN B12) 1000 MCG tablet Take 1,000 mcg by mouth daily.    [provider]  DULoxetine  (CYMBALTA ) 30 MG capsule TAKE 1 CAPSULE BY MOUTH EVERY DAY 01/21/23   Onita Duos, MD  ferrous sulfate  325 (65 FE) MG tablet Take 325 mg by mouth every Wednesday.     [provider]  gabapentin  (NEURONTIN ) 100 MG capsule TAKE 3 CAPSULES BY MOUTH 3 TIMES DAILY. 11/12/23   Onita Duos, MD  hydrALAZINE  (APRESOLINE ) 10 MG tablet Take 10 mg by mouth 2 (two) times daily.    [provider]  hydrochlorothiazide (HYDRODIURIL) 12.5 MG tablet Take 12.5 mg by mouth daily.    [provider]  losartan  (COZAAR ) 100 MG tablet Take 1 tablet (100  mg total) by mouth daily. 05/06/15   Court Dorn PARAS, MD  Omega-3 Fatty Acids (FISH OIL) 1000 MG CAPS Take 1,000 mg by mouth daily.     [provider]  pantoprazole  (PROTONIX ) 40 MG tablet Take 40 mg by mouth daily. 07/06/19   [provider]    Scheduled Meds:  acetaminophen   1,000 mg Oral Q8H   cyanocobalamin   1,000 mcg Oral Daily   diltiazem   30 mg Oral Q8H   gabapentin   200 mg Oral TID   lidocaine   1 patch Transdermal Q24H   pantoprazole   40 mg Oral BID   polyethylene glycol  17 g Oral Daily   senna  1 tablet Oral BID   sodium chloride  flush  3 mL Intravenous Q12H   Continuous Infusions:  sodium chloride      diltiazem  (CARDIZEM ) infusion 5 mg/hr (03/04/24 0156)   heparin  900 Units/hr (03/03/24 2318)   PRN Meds: sodium chloride , bisacodyl , hydrALAZINE , morphine  injection, ondansetron  **OR** ondansetron  (ZOFRAN ) IV, oxyCODONE , sodium chloride  flush  Allergies:   Allergies[1]  Social History:   Social History    Socioeconomic History   Marital status: Married    Spouse name: Not on file   Number of children: Not on file   Years of education: Not on file   Highest education level: Not on file  Occupational History   Not on file  Tobacco Use   Smoking status: Every Day    Current packs/day: 0.00    Average packs/day: 1 pack/day for 50.0 years (50.0 ttl pk-yrs)    Types: Cigarettes    Start date: 07/13/1960    Last attempt to quit: 07/14/2010    Years since quitting: 13.6   Smokeless tobacco: Never   Tobacco comments:    Resumed smoking 8 months ago, smokes approximately 7 cigarettes daily.. Counseling given    02/09/2022 Patient smokes 3 daily  Substance and Sexual Activity   Alcohol use: Yes    Alcohol/week: 3.0 standard drinks of alcohol    Types: 3 Glasses of wine per week    Comment: social   Drug use: No   Sexual activity: Not Currently  Other Topics Concern   Not on file  Social History Narrative   Not on file   Social Drivers of Health   Tobacco Use: High Risk (03/04/2024)   Patient History    Smoking Tobacco Use: Every Day    Smokeless Tobacco Use: Never    Passive Exposure: Not on file  Financial Resource Strain: Low Risk (10/13/2021)   Received from [facility]   Overall Financial Resource Strain (CARDIA)    Difficulty of Paying Living Expenses: Not hard at all  Food Insecurity: No Food Insecurity (03/04/2024)   Epic    Worried About Radiation Protection Practitioner of Food in the Last Year: Never true    Ran Out of Food in the Last Year: Never true  Transportation Needs: No Transportation Needs (03/04/2024)   Epic    Lack of Transportation (Medical): No    Lack of Transportation (Non-Medical): No  Physical Activity: Inactive (10/13/2021)   Received from [facility]   Exercise Vital Sign    On average, how many days per week do you engage in moderate to strenuous exercise (like a brisk walk)?: 0 days    On average, how many minutes do you engage in exercise at this level?: 0 min   Stress: No Stress Concern Present (10/13/2021)   Received from [facility] - Occupational Stress Questionnaire    Feeling of Stress : Not at all  Social Connections: Socially Integrated (03/04/2024)   Social Connection and Isolation Panel    Frequency of Communication with Friends and Family: More than three times a week    Frequency of Social Gatherings with Friends and Family: Three times a week    Attends Religious Services: More than 4 times per year    Active Member of Clubs or Organizations: Yes    Attends Banker Meetings: More than 4 times per year    Marital Status: Married  Catering Manager Violence: Not At Risk (03/04/2024)   Epic    Fear of Current or Ex-Partner: No    Emotionally Abused: No    Physically Abused: No    Sexually Abused: No  Depression (PHQ2-9): Not on file  Alcohol Screen: Not on file  Housing: Low Risk (03/04/2024)   Epic    Unable to Pay for Housing in the Last Year: No    Number of Times Moved in the Last Year: 0    Homeless in the Last Year: No  Utilities: Not At Risk (03/04/2024)   Epic    Threatened with loss of utilities: No  Health Literacy: Not on file    Family History:    Family History  Problem Relation Age of Onset   Cancer Mother 62   Heart attack Father        died 67   Parkinson's disease Sister    Heart disease Sister    Neuropathy Sister    Heart attack Maternal Grandmother    Heart attack Maternal Grandfather 50       died at 70   Stroke Paternal Grandfather    Deep vein thrombosis Daughter      ROS:  Please see the history of present illness.   All other ROS reviewed and negative.     Physical Exam/Data: Vitals:   03/04/24 1125 03/04/24 1254 03/04/24 1300 03/04/24 1417  BP: 124/83 112/75 128/70 119/72  Pulse: 91   (!) 102  Resp: (!) 31 (!) 22 (!) 28   Temp: 98.2 F (36.8 C)   98.9 F (37.2 C)  TempSrc:    Oral  SpO2: 91% 91% 91% 93%  Weight:       Height:    5' 6 (1.676 m)    Intake/Output Summary (Last 24 hours) at 03/04/2024 1629 Last data filed at 03/04/2024 1224 Gross per 24 hour  Intake 138.12 ml  Output --  Net 138.12 ml      03/03/2024    9:46 PM 02/18/2024   10:58 AM 07/13/2023   12:03 PM  Last 3 Weights  Weight (lbs) 136 lb 136 lb 144 lb  Weight (kg) 61.689 kg 61.689 kg 65.318 kg     Body mass index is 21.95 kg/m.  General:  Well nourished, well developed, in no acute distress HEENT: normal Neck: no JVD Vascular: No carotid bruits; Distal pulses 2+ bilaterally Cardiac:  normal S1, S2; RRR; no murmur  Lungs:  clear to auscultation bilaterally, no wheezing, rhonchi or rales  Abd: soft, nontender, no hepatomegaly  Ext: no edema Musculoskeletal:  No deformities Skin: warm and dry  Neuro:   no focal abnormalities noted Psych:  Normal affect   EKG:  The EKG was personally reviewed and demonstrates:  showed atrial flutter with a variable A-V conduction, and a ventricular rate of 106 Telemetry:  Telemetry was personally reviewed and demonstrates: Atrial flutter  with a ventricular rate in the 100s to 110s.  Relevant CV Studies: Echo pending  Laboratory Data: High Sensitivity Troponin:  No results for input(s): TROPONINIHS in the last 720 hours.  Recent Labs  Lab 03/03/24 2156 03/03/24 2321  TRNPT 17 18      Chemistry Recent Labs  Lab 03/03/24 2156 03/03/24 2223 03/04/24 0940  NA 136  --   --   K 3.8  --   --   CL 98  --   --   CO2 26  --   --   GLUCOSE 124*  --   --   BUN 15  --   --   CREATININE 0.74  --   --   CALCIUM  9.9  --   --   MG  --  1.3* 1.9  GFRNONAA >60  --   --   ANIONGAP 11  --   --     No results for input(s): PROT, ALBUMIN, AST, ALT, ALKPHOS, BILITOT in the last 168 hours. Lipids No results for input(s): CHOL, TRIG, HDL, LABVLDL, LDLCALC, CHOLHDL in the last 168 hours.  Hematology Recent Labs  Lab 03/03/24 2156  WBC 11.0*  RBC 4.28  HGB 13.8  HCT  41.4  MCV 96.7  MCH 32.2  MCHC 33.3  RDW 14.6  PLT 206   Thyroid  No results for input(s): TSH, FREET4 in the last 168 hours.  BNPNo results for input(s): BNP, PROBNP in the last 168 hours.  DDimer No results for input(s): DDIMER in the last 168 hours.  Radiology/Studies:  [facility] Chest Port 1 View Result Date: 03/03/2024 EXAM: 2 Views XRAY of the Chest 03/03/2024 10:15:00 PM COMPARISON: CT 08/03/2021. CLINICAL HISTORY: 355200 Chest pain 644799 cp cp 355200 Chest pain [id number] FINDINGS: LUNGS AND PLEURA: Nodular densities project over both upper lobes, new since prior study. No pleural effusion. No pneumothorax. HEART AND MEDIASTINUM: No acute abnormality of the cardiac and mediastinal silhouettes. Aortic atherosclerosis. BONES AND SOFT TISSUES: No acute osseous abnormality. IMPRESSION: 1. Nodular densities project over both upper lobes, new since prior study. This could be further evaluated with chest CT. Electronically signed by: Franky Crease MD 03/03/2024 10:19 PM EST RP Workstation: [id number]     Assessment and Plan: CHIDERA DEARCOS is a 84 y.o. female with a hx of hypertension, hyperlipidemia, tobacco abuse, carotid artery stenosis, history of GI bleed, AAA 3.9 cm, PVCs, PACs, atrial tachycardia, carotid artery disease, PAD with a remote history of percutaneous revascularization of the right SFA, and CAD with a remote history of PCI, who is being seen 03/04/2024 for the evaluation of atrial fibrillation at the request of Raenelle Grew MD.   New onset atrial flutter CHA2DS2-VASc Score = 5 [CHF History: 0, HTN History: 1, Diabetes History: 0, Stroke History: 0, Vascular Disease History: 1, Age Score: 2, Gender Score: 1].  Therefore, the patient's annual risk of stroke is 7.2 %.    Presented to the hospital for chest pain and worsening shortness of breath since yesterday.  Denies any prior history of atrial flutter or atrial fibrillation. EKG showed atrial flutter with a rate of  106 Labs showed potassium of 3.8, creatinine of 0.74, magnesium  of 1.9.  Goal potassium greater than 4 goal magnesium  greater than 2.  Discussed a TEE cardioversion.  Patient is overall agreeable to the procedure but would like Dr. Court to be made aware and had some concerns about her lower extremity angiography scheduled on 03/13/2024.  May consider the cardioversion on Friday.  Stop IV heparin  Continue IV Cardizem  Start oral Cardizem  30 mg every 8 hours. Start Eliquis  per pharmacy Stop IV heparin  Echo pending TSH pending   CAD with remote history of PCI Hyperlipidemia Labs showed high-sensitivity troponins of 17 > 18.   Tobacco use Reported that she has been smoking about 5 cigarettes a day for the past 50 years.  Recommend cessation.   PAD s/p stents to the right SFA in 2006 ,2015, and 2016 Reported that she has had worsening claudication recently.  Is scheduled to have lower extremity angiography with Dr. Darron on 03/13/2024.    Risk Assessment/Risk Scores:       CHA2DS2-VASc Score = 5  :1} This indicates a 7.2% annual risk of stroke. The patient's score is based upon: CHF History: 0 HTN History: 1 Diabetes History: 0 Stroke History: 0 Vascular Disease History: 1 Age Score: 2 Gender Score: 1       For questions or updates, please contact [facility] Please consult [email or web address] for contact info under     Signed, Morse Clause, PA-C  03/04/2024 4:29 PM     [1] No Known Allergies  "

## 2024-03-04 NOTE — Discharge Instructions (Addendum)
 Information on my medicine - ELIQUIS  (apixaban )  Why was Eliquis  prescribed for you? Eliquis  was prescribed for you to reduce the risk of forming blood clots that can cause a stroke if you have a medical condition called atrial fibrillation (a type of irregular heartbeat) OR to reduce the risk of a blood clots forming after orthopedic surgery.  What do You need to know about Eliquis  ? Take your Eliquis  TWICE DAILY - one tablet in the morning and one tablet in the evening with or without food.  It would be best to take the doses about the same time each day.  If you have difficulty swallowing the tablet whole please discuss with your pharmacist how to take the medication safely.  Take Eliquis  exactly as prescribed by your doctor and DO NOT stop taking Eliquis  without talking to the doctor who prescribed the medication.  Stopping may increase your risk of developing a new clot or stroke.  Refill your prescription before you run out.  After discharge, you should have regular check-up appointments with your healthcare provider that is prescribing your Eliquis .  In the future your dose may need to be changed if your kidney function or weight changes by a significant amount or as you get older.  What do you do if you miss a dose? If you miss a dose, take it as soon as you remember on the same day and resume taking twice daily.  Do not take more than one dose of ELIQUIS  at the same time.  Important Safety Information A possible side effect of Eliquis  is bleeding. You should call your healthcare provider right away if you experience any of the following: Bleeding from an injury or your nose that does not stop. Unusual colored urine (red or dark brown) or unusual colored stools (red or black). Unusual bruising for unknown reasons. A serious fall or if you hit your head (even if there is no bleeding).  Some medicines may interact with Eliquis  and might increase your risk of bleeding or  clotting while on Eliquis . To help avoid this, consult your healthcare provider or pharmacist prior to using any new prescription or non-prescription medications, including herbals, vitamins, non-steroidal anti-inflammatory drugs (NSAIDs) and supplements.  This website has more information on Eliquis  (apixaban ): http://www.eliquis .com/eliquis /home  ==================================================  Atrial Fibrillation    Atrial fibrillation is a type of heartbeat that is irregular or fast. If you have this condition, your heart beats without any order. This makes it hard for your heart to pump blood in a normal way. Atrial fibrillation may come and go, or it may become a long-lasting problem. If this condition is not treated, it can put you at higher risk for stroke, heart failure, and other heart problems.  What are the causes? This condition may be caused by diseases that damage the heart. They include: High blood pressure. Heart failure. Heart valve disease. Heart surgery. Other causes include: Diabetes. Thyroid  disease. Being overweight. Kidney disease. Sometimes the cause is not known.  What increases the risk? You are more likely to develop this condition if: You are older. You smoke. You exercise often and very hard. You have a family history of this condition. You are a man. You use drugs. You drink a lot of alcohol. You have lung conditions, such as emphysema, pneumonia, or COPD. You have sleep apnea.  What are the signs or symptoms? Common symptoms of this condition include: A feeling that your heart is beating very fast. Chest pain or discomfort. Feeling  short of breath. Suddenly feeling light-headed or weak. Getting tired easily during activity. Fainting. Sweating. In some cases, there are no symptoms.  How is this treated? Treatment for this condition depends on underlying conditions and how you feel when you have atrial fibrillation. They  include: Medicines to: Prevent blood clots. Treat heart rate or heart rhythm problems. Using devices, such as a pacemaker, to correct heart rhythm problems. Doing surgery to remove the part of the heart that sends bad signals. Closing an area where clots can form in the heart (left atrial appendage). In some cases, your doctor will treat other underlying conditions.  Follow these instructions at home:  Medicines Take over-the-counter and prescription medicines only as told by your doctor. Do not take any new medicines without first talking to your doctor. If you are taking blood thinners: Talk with your doctor before you take any medicines that have aspirin  or NSAIDs, such as ibuprofen, in them. Take your medicine exactly as told by your doctor. Take it at the same time each day. Avoid activities that could hurt or bruise you. Follow instructions about how to prevent falls. Wear a bracelet that says you are taking blood thinners. Or, carry a card that lists what medicines you take. Lifestyle         Do not use any products that have nicotine or tobacco in them. These include cigarettes, e-cigarettes, and chewing tobacco. If you need help quitting, ask your doctor. Eat heart-healthy foods. Talk with your doctor about the right eating plan for you. Exercise regularly as told by your doctor. Do not drink alcohol. Lose weight if you are overweight. Do not use drugs, including cannabis.  General instructions If you have a condition that causes breathing to stop for a short period of time (apnea), treat it as told by your doctor. Keep a healthy weight. Do not use diet pills unless your doctor says they are safe for you. Diet pills may make heart problems worse. Keep all follow-up visits as told by your doctor. This is important.  Contact a doctor if: You notice a change in the speed, rhythm, or strength of your heartbeat. You are taking a blood-thinning medicine and you get more  bruising. You get tired more easily when you move or exercise. You have a sudden change in weight.  Get help right away if:    You have pain in your chest or your belly (abdomen). You have trouble breathing. You have side effects of blood thinners, such as blood in your vomit, poop (stool), or pee (urine), or bleeding that cannot stop. You have any signs of a stroke. BE FAST is an easy way to remember the main warning signs: B - Balance. Signs are dizziness, sudden trouble walking, or loss of balance. E - Eyes. Signs are trouble seeing or a change in how you see. F - Face. Signs are sudden weakness or loss of feeling in the face, or the face or eyelid drooping on one side. A - Arms. Signs are weakness or loss of feeling in an arm. This happens suddenly and usually on one side of the body. S - Speech. Signs are sudden trouble speaking, slurred speech, or trouble understanding what people say. T - Time. Time to call emergency services. Write down what time symptoms started. You have other signs of a stroke, such as: A sudden, very bad headache with no known cause. Feeling like you may vomit (nausea). Vomiting. A seizure.  These symptoms may be an  emergency. Do not wait to see if the symptoms will go away. Get medical help right away. Call your local emergency services (911 in the [place] Do not drive yourself to the hospital. Summary Atrial fibrillation is a type of heartbeat that is irregular or fast. You are at higher risk of this condition if you smoke, are older, have diabetes, or are overweight. Follow your doctor's instructions about medicines, diet, exercise, and follow-up visits. Get help right away if you have signs or symptoms of a stroke. Get help right away if you cannot catch your breath, or you have chest pain or discomfort. This information is not intended to replace advice given to you by your health care provider. Make sure you discuss any questions you have with your  health care provider. Document Revised: 07/23/2018 Document Reviewed: 07/23/2018 Elsevier Patient Education  2020 [employer].

## 2024-03-04 NOTE — Progress Notes (Signed)
 RN spoke with RX and Dr Franky about cardizem  being given both P.O and gtt. Cardizem  gtt was D/C and Rx gave the go ahead for P.O

## 2024-03-04 NOTE — ED Notes (Addendum)
Attempted blood draw x 2  

## 2024-03-04 NOTE — Progress Notes (Signed)
 PHARMACY - ANTICOAGULATION CONSULT NOTE  Pharmacy Consult for heparin  Indication: Aflutter  Allergies[1]  Patient Measurements: Height: 5' 7 (170.2 cm) Weight: 61.7 kg (136 lb) IBW/kg (Calculated) : 61.6 HEPARIN  DW (KG): 61.7  Vital Signs: Temp: 98.2 F (36.8 C) (01/21 0851) BP: 137/79 (01/21 0945) Pulse Rate: 95 (01/21 0945)  Labs: Recent Labs    03/03/24 2156 03/04/24 0940  HGB 13.8  --   HCT 41.4  --   PLT 206  --   HEPARINUNFRC  --  0.37  CREATININE 0.74  --     Estimated Creatinine Clearance: 51.8 mL/min (by C-G formula based on SCr of 0.74 mg/dL).  Assessment: 84yo female c/o CP radiating to neck and associated w/ SOB, found to be in new-onset Aflutter. Heparin  was started. First heparin  level is therapeutic at 0.37. No bleeding noted.   Goal of Therapy:  Heparin  level 0.3-0.7 units/ml Monitor platelets by anticoagulation protocol: Yes   Plan:  Continue heparin  gtt 900 units/hr Recheck heparin  level this evening to confirm Daily heparin  level and CBC  Vernell Meier, PharmD, BCEMP Clinical Pharmacist Please see AMION for all pharmacy numbers 03/04/2024 11:42 AM        [1] No Known Allergies

## 2024-03-05 ENCOUNTER — Observation Stay (HOSPITAL_COMMUNITY)

## 2024-03-05 DIAGNOSIS — I4892 Unspecified atrial flutter: Secondary | ICD-10-CM | POA: Diagnosis not present

## 2024-03-05 DIAGNOSIS — I4891 Unspecified atrial fibrillation: Secondary | ICD-10-CM | POA: Diagnosis not present

## 2024-03-05 LAB — COMPREHENSIVE METABOLIC PANEL WITH GFR
ALT: 8 U/L (ref 0–44)
AST: 16 U/L (ref 15–41)
Albumin: 3.3 g/dL — ABNORMAL LOW (ref 3.5–5.0)
Alkaline Phosphatase: 86 U/L (ref 38–126)
Anion gap: 10 (ref 5–15)
BUN: 19 mg/dL (ref 8–23)
CO2: 23 mmol/L (ref 22–32)
Calcium: 8.8 mg/dL — ABNORMAL LOW (ref 8.9–10.3)
Chloride: 96 mmol/L — ABNORMAL LOW (ref 98–111)
Creatinine, Ser: 0.83 mg/dL (ref 0.44–1.00)
GFR, Estimated: >60 mL/min
Glucose, Bld: 105 mg/dL — ABNORMAL HIGH (ref 70–99)
Potassium: 4.4 mmol/L (ref 3.5–5.1)
Sodium: 129 mmol/L — ABNORMAL LOW (ref 135–145)
Total Bilirubin: 0.8 mg/dL (ref 0.0–1.2)
Total Protein: 6.2 g/dL — ABNORMAL LOW (ref 6.5–8.1)

## 2024-03-05 LAB — CBC
HCT: 39.6 % (ref 36.0–46.0)
Hemoglobin: 13.2 g/dL (ref 12.0–15.0)
MCH: 32.6 pg (ref 26.0–34.0)
MCHC: 33.3 g/dL (ref 30.0–36.0)
MCV: 97.8 fL (ref 80.0–100.0)
Platelets: 170 K/uL (ref 150–400)
RBC: 4.05 MIL/uL (ref 3.87–5.11)
RDW: 14.3 % (ref 11.5–15.5)
WBC: 11.3 K/uL — ABNORMAL HIGH (ref 4.0–10.5)
nRBC: 0 % (ref 0.0–0.2)

## 2024-03-05 LAB — ECHOCARDIOGRAM COMPLETE
Calc EF: 55.1 %
Height: 66 in
S' Lateral: 3.7 cm
Single Plane A2C EF: 51.7 %
Single Plane A4C EF: 57.4 %
Weight: 2176 [oz_av]

## 2024-03-05 MED ORDER — SODIUM CHLORIDE 0.9 % IV SOLN: INTRAVENOUS | Status: DC

## 2024-03-05 MED ORDER — IOHEXOL 350 MG/ML SOLN
75.0000 mL | Freq: Once | INTRAVENOUS | Status: AC | PRN
  Administered 2024-03-05: 75 mL via INTRAVENOUS

## 2024-03-05 NOTE — Plan of Care (Addendum)
 Patient was seen and examined at bed side after patient arrived from [facility] yesterday @ 4:15 pm.,  She was hemodynamically stable. Cardiology has evaluated the patient.

## 2024-03-05 NOTE — TOC CM/SW Note (Signed)
 Transition of Care [facility]) - Inpatient Brief Assessment   Patient Details  Name: Sara Dominguez MRN: [id number] Date of Birth: 27-Aug-1940  [facility] of [facility]) CM/SW Contact:    Sudie Erminio Deems, RN Phone Number: 03/05/2024, 6:37 PM   Clinical Narrative: Patient presented for chest pain. PTA patient was independent from home with spouse. Patient does not use any DME in the home and she is not active with [facility]. Patient uses [facility] [address] for medications. No home needs identified at this time. ICM will continue to follow for additional disposition needs.     Transition of Care Asessment: Insurance and Status: Insurance coverage has been reviewed Patient has primary care physician: Yes Home environment has been reviewed: reviewed Prior level of function:: independent Prior/Current Home Services: No current home services Social Drivers of Health Review: SDOH reviewed no interventions necessary Readmission risk has been reviewed: Yes Transition of care needs: no transition of care needs at this time

## 2024-03-05 NOTE — Plan of Care (Signed)

## 2024-03-05 NOTE — Progress Notes (Signed)
"  °  Progress Note  Patient Name: Sara Dominguez Date of Encounter: 03/05/2024 [facility] Cardiologist: [name], MD   Interval Summary   Remains in overall rate controlled atrial flutter. Dilt infusion off. Still with pleuritic chest pain receiving oxycodone  for pain.   Vital Signs Vitals:   03/04/24 2131 03/05/24 0010 03/05/24 0411 03/05/24 0823  BP:  110/61 101/78 (!) (P) 111/58  Pulse:  92 88 (P) 91  Resp:  19 18 (P) 15  Temp:  98 F (36.7 C) 97.7 F (36.5 C) (P) 98.2 F (36.8 C)  TempSrc:  Oral Oral (P) Oral  SpO2: 93% 93% (!) 88% (P) 97%  Weight:      Height:        Intake/Output Summary (Last 24 hours) at 03/05/2024 0857 Last data filed at 03/05/2024 0300 Gross per 24 hour  Intake 515.28 ml  Output --  Net 515.28 ml      03/03/2024    9:46 PM 02/18/2024   10:58 AM 07/13/2023   12:03 PM  Last 3 Weights  Weight (lbs) 136 lb 136 lb 144 lb  Weight (kg) 61.689 kg 61.689 kg 65.318 kg      Telemetry/ECG  Aflutter rates 80s-100s - Personally Reviewed  Physical Exam  GEN: No acute distress.   Neck: No JVD Cardiac: iRRR, no murmurs, rubs, or gallops.  Respiratory: Clear to auscultation bilaterally. GI: Soft, nontender, non-distended  MS: No edema  Assessment & Plan   Atrial flutter with rapid ventricular response, new onset Pleuritic chest pain History of GI bleed, last endoscopy noted 2019 with treated lesions -Normal troponins, D-dimer elevated. ESR 11 normal but CRP elevated 15.7, echo pending.  With pleuritic chest pain will want to ensure no concerns for PE or pericardial effusion/pericarditis. - consider CT PE today. I will order now, pt agrees. -TSH normal -Start Eliquis  5 mg twice daily.  History of GI bleed.  Counseled on risk. -dilt gtt stopped, now on dilt 30 mg TID -Unclear etiology of onset but did experience chest pain.  We spent quite a bit of time talking about inpatient TEE cardioversion versus deferring to the outpatient after  4 weeks of anticoagulation.  Complicating factor is an upcoming peripheral intervention on January 30 for which anticoagulation would have to be interrupted presumably.  Discussed with primary cardiologist and her intervention is elective and Dr. Lesches suggested rescheduling in favor of treating atrial flutter. I will reschedule it after plan is clearer.   Peripheral vascular disease -Anticipating peripheral angiography and possible endovascular therapy due to symptomatic PAD on 03/13/2024.  This would likely require interruption in anticoagulation, which would complicate our plan to perform a TEE cardioversion inpatient which would then necessitate 30 days of uninterrupted anticoagulation.  We will start Eliquis  and discussed with the primary cardiologist as to anticipated timing of procedure, see above.  I suspect PV intervention can be delayed slightly to facilitate restoration of sinus rhythm. -Continue Plavix  75 mg daily   Coronary artery disease -Chest pain on admission with normal troponins, chest pain sounds more pleuritic than anginal -Continue Plavix  75 mg daily in addition to now newly added Eliquis  5 mg twice daily   Hypertension -normotensive on dilt 30 mg TID. Continue.      For questions or updates, please contact [facility] Please consult [email or web address] for contact info under         Signed, Soyla DELENA [employer], MD   "

## 2024-03-05 NOTE — Progress Notes (Signed)
 Echocardiogram 2D Echocardiogram has been performed.  Koleen KANDICE Popper, RDCS 03/05/2024, 5:04 PM

## 2024-03-05 NOTE — Progress Notes (Signed)
 " PROGRESS NOTE  Sara Dominguez  FMW:[id number] DOB: 1940-09-29 DOA: 03/03/2024 PCP: Cleotilde Planas, MD   Brief Narrative: Patient is a 84 year old female with history of peripheral artery disease status post PCI to SFA, coronary artery disease status post multiple PCI's, hypertension, hyperlipidemia who presented with chest pain.  Rated chest pain as 9/10, sharp on the lower left part of the chest, reproducible with gentle palpation and while taking deep breath.  On presentation, EKG showed A-fib with RVR.  Started on Cardizem  drip, heparin  drip, cardiology consulted.  Remains in A-fib rhythm with controlled rate this morning.  Assessment & Plan:  Principal Problem:   Atrial fibrillation with RVR (HCC) Active Problems:   PAD Rt SFA stent 2006 with ISR 5/13. New RSFA stent 05/14/13   CAD- LAD stent in 2006, chronically occluded OM branch with hx. of Stent to RCA    Hyperlipidemia with target LDL less than 70   Essential hypertension   A-flutter with RVR: Initially started on Cardizem /heparin  drip.  TSH  is normal.  Echo pending. Cardizem  drip was stopped overnight.  Currently heart rate is well-controlled.  Started on Eliquis .  On oral Cardizem .  Possible plan for TEE/DCCV  Chest pain: Atypical/pleuritic chest pain is a possibility.  Troponins negative.  Pending echo.  Has history of coronary artery disease, status post PCI.  Takes Plavix  at home.  Chest pain is better but still there today.  Mild elevated D-dimer.  Cardiology ordered CT angiogram to rule out PE.  Hypertension: Currently blood pressure stable.  Takes losartan , hydrochlorothiazide, hydralazine  at home  Peripheral artery disease: Follows with Dr. Court.  History of multiple interventions.  She has been scheduled for potential revascularization/endovascular repair on her distal left SFA.  Hyperlipidemia: On statin  GERD: Continue PPI  Neuropathy: On Neurontin   Hyponatremia: Started on gentle normal saline.  Will see how  she responds         DVT prophylaxis: apixaban  (ELIQUIS ) tablet 5 mg     Code Status: Full Code  Family Communication:   Patient status:Obs  Patient is from :Home  Anticipated discharge un:ynfz  Estimated DC date:1-2 days   Consultants: Cardiology  Procedures:None  Antimicrobials:  Anti-infectives (From admission, onward)    None       Subjective: Patient seen and examined at bedside today.  Hemodynamically stable.  She was in A-fib rhythm.  Rate controlled.  Denies any shortness of breath.  Still has some left sided  chest pain.  Appears comfortable.  Objective: Vitals:   03/04/24 1945 03/04/24 2131 03/05/24 0010 03/05/24 0411  BP: 99/68  110/61 101/78  Pulse: 83  92 88  Resp: 20  19 18   Temp:   98 F (36.7 C) 97.7 F (36.5 C)  TempSrc: Oral  Oral Oral  SpO2:  93% 93% (!) 88%  Weight:      Height:        Intake/Output Summary (Last 24 hours) at 03/05/2024 0758 Last data filed at 03/05/2024 0300 Gross per 24 hour  Intake 515.28 ml  Output --  Net 515.28 ml   Filed Weights   03/03/24 2146  Weight: 61.7 kg    Examination:  General exam: Overall comfortable, not in distress HEENT: PERRL Respiratory system:  no wheezes or crackles  Cardiovascular system: Irregularly irregular rhythm.  Gastrointestinal system: Abdomen is nondistended, soft and nontender. Central nervous system: Alert and oriented Extremities: No edema, no clubbing ,no cyanosis Skin: No rashes, no ulcers,no icterus     Data  Reviewed: I have personally reviewed following labs and imaging studies  CBC: Recent Labs  Lab 03/03/24 2156 03/05/24 0350  WBC 11.0* 11.3*  HGB 13.8 13.2  HCT 41.4 39.6  MCV 96.7 97.8  PLT 206 170   Basic Metabolic Panel: Recent Labs  Lab 03/03/24 2156 03/03/24 2223 03/04/24 0940 03/05/24 0350  NA 136  --   --  129*  K 3.8  --   --  4.4  CL 98  --   --  96*  CO2 26  --   --  23  GLUCOSE 124*  --   --  105*  BUN 15  --   --  19   CREATININE 0.74  --   --  0.83  CALCIUM  9.9  --   --  8.8*  MG  --  1.3* 1.9  --      No results found for this or any previous visit (from the past 240 hours).   Radiology Studies: DG Chest Port 1 View Result Date: 03/03/2024 EXAM: 2 Views XRAY of the Chest 03/03/2024 10:15:00 PM COMPARISON: CT 08/03/2021. CLINICAL HISTORY: 355200 Chest pain 644799 cp cp 355200 Chest pain [id number] FINDINGS: LUNGS AND PLEURA: Nodular densities project over both upper lobes, new since prior study. No pleural effusion. No pneumothorax. HEART AND MEDIASTINUM: No acute abnormality of the cardiac and mediastinal silhouettes. Aortic atherosclerosis. BONES AND SOFT TISSUES: No acute osseous abnormality. IMPRESSION: 1. Nodular densities project over both upper lobes, new since prior study. This could be further evaluated with chest CT. Electronically signed by: Franky Crease MD 03/03/2024 10:19 PM EST RP Workstation: [id number]    Scheduled Meds:  acetaminophen   1,000 mg Oral Q8H   apixaban   5 mg Oral BID   cyanocobalamin   1,000 mcg Oral Daily   diltiazem   30 mg Oral Q8H   gabapentin   200 mg Oral TID   lidocaine   1 patch Transdermal Q24H   pantoprazole   40 mg Oral BID   polyethylene glycol  17 g Oral Daily   senna  1 tablet Oral BID   sodium chloride  flush  3 mL Intravenous Q12H   Continuous Infusions:  sodium chloride        LOS: 0 days   Ivonne Mustache, MD [facility] P1/22/2026, 7:58 AM  "

## 2024-03-05 NOTE — Care Management Obs Status (Signed)
 MEDICARE OBSERVATION STATUS NOTIFICATION   Patient Details  Name: Sara Dominguez MRN: [id number] Date of Birth: 10-03-1940   Medicare Observation Status Notification Given:       Vonzell Arrie Sharps 03/05/2024, 8:59 AM

## 2024-03-06 ENCOUNTER — Observation Stay (HOSPITAL_COMMUNITY): Admitting: Anesthesiology

## 2024-03-06 ENCOUNTER — Encounter (HOSPITAL_COMMUNITY): Admission: EM | Payer: Self-pay | Source: Home / Self Care | Attending: Emergency Medicine

## 2024-03-06 ENCOUNTER — Observation Stay (HOSPITAL_COMMUNITY)

## 2024-03-06 DIAGNOSIS — I340 Nonrheumatic mitral (valve) insufficiency: Secondary | ICD-10-CM | POA: Diagnosis not present

## 2024-03-06 DIAGNOSIS — F17210 Nicotine dependence, cigarettes, uncomplicated: Secondary | ICD-10-CM

## 2024-03-06 DIAGNOSIS — I2510 Atherosclerotic heart disease of native coronary artery without angina pectoris: Secondary | ICD-10-CM

## 2024-03-06 DIAGNOSIS — I361 Nonrheumatic tricuspid (valve) insufficiency: Secondary | ICD-10-CM | POA: Diagnosis not present

## 2024-03-06 DIAGNOSIS — I4891 Unspecified atrial fibrillation: Secondary | ICD-10-CM | POA: Diagnosis not present

## 2024-03-06 DIAGNOSIS — I4892 Unspecified atrial flutter: Secondary | ICD-10-CM | POA: Diagnosis not present

## 2024-03-06 DIAGNOSIS — I10 Essential (primary) hypertension: Secondary | ICD-10-CM

## 2024-03-06 LAB — CBC
HCT: 35.2 % — ABNORMAL LOW (ref 36.0–46.0)
Hemoglobin: 11.8 g/dL — ABNORMAL LOW (ref 12.0–15.0)
MCH: 32.8 pg (ref 26.0–34.0)
MCHC: 33.5 g/dL (ref 30.0–36.0)
MCV: 97.8 fL (ref 80.0–100.0)
Platelets: 166 K/uL (ref 150–400)
RBC: 3.6 MIL/uL — ABNORMAL LOW (ref 3.87–5.11)
RDW: 14.2 % (ref 11.5–15.5)
WBC: 9.5 K/uL (ref 4.0–10.5)
nRBC: 0 % (ref 0.0–0.2)

## 2024-03-06 LAB — ECHO TEE

## 2024-03-06 LAB — BASIC METABOLIC PANEL WITH GFR
Anion gap: 9 (ref 5–15)
BUN: 15 mg/dL (ref 8–23)
CO2: 23 mmol/L (ref 22–32)
Calcium: 8.6 mg/dL — ABNORMAL LOW (ref 8.9–10.3)
Chloride: 100 mmol/L (ref 98–111)
Creatinine, Ser: 0.93 mg/dL (ref 0.44–1.00)
GFR, Estimated: >60 mL/min
Glucose, Bld: 102 mg/dL — ABNORMAL HIGH (ref 70–99)
Potassium: 4.1 mmol/L (ref 3.5–5.1)
Sodium: 133 mmol/L — ABNORMAL LOW (ref 135–145)

## 2024-03-06 MED ORDER — PROPOFOL 10 MG/ML IV BOLUS
INTRAVENOUS | Status: DC | PRN
  Administered 2024-03-06: 40 mg via INTRAVENOUS

## 2024-03-06 MED ORDER — SODIUM CHLORIDE 0.9 % IV SOLN: INTRAVENOUS | Status: DC | PRN

## 2024-03-06 MED ORDER — AMIODARONE HCL IN DEXTROSE 360-4.14 MG/200ML-% IV SOLN
30.0000 mg/h | INTRAVENOUS | Status: DC
  Administered 2024-03-06 – 2024-03-07 (×2): 30 mg/h via INTRAVENOUS
  Filled 2024-03-06 (×3): qty 200

## 2024-03-06 MED ORDER — ESMOLOL HCL 100 MG/10ML IV SOLN
INTRAVENOUS | Status: DC | PRN
  Administered 2024-03-06: 20 mg via INTRAVENOUS

## 2024-03-06 MED ORDER — COLCHICINE 0.6 MG PO TABS
0.6000 mg | ORAL_TABLET | Freq: Two times a day (BID) | ORAL | Status: DC
  Administered 2024-03-06 – 2024-03-07 (×3): 0.6 mg via ORAL
  Filled 2024-03-06 (×3): qty 1

## 2024-03-06 MED ORDER — PROPOFOL 500 MG/50ML IV EMUL
INTRAVENOUS | Status: DC | PRN
  Administered 2024-03-06: 150 ug/kg/min via INTRAVENOUS

## 2024-03-06 MED ORDER — AMIODARONE HCL IN DEXTROSE 360-4.14 MG/200ML-% IV SOLN
60.0000 mg/h | INTRAVENOUS | Status: DC
  Administered 2024-03-06 (×2): 60 mg/h via INTRAVENOUS
  Filled 2024-03-06: qty 200

## 2024-03-06 MED ORDER — APIXABAN 5 MG PO TABS
ORAL_TABLET | ORAL | Status: AC
  Filled 2024-03-06: qty 1

## 2024-03-06 MED ORDER — DILTIAZEM HCL 30 MG PO TABS
30.0000 mg | ORAL_TABLET | Freq: Once | ORAL | Status: AC
  Administered 2024-03-06: 30 mg via ORAL

## 2024-03-06 MED ORDER — AMIODARONE LOAD VIA INFUSION
150.0000 mg | Freq: Once | INTRAVENOUS | Status: AC
  Administered 2024-03-06: 150 mg via INTRAVENOUS
  Filled 2024-03-06: qty 83.34

## 2024-03-06 NOTE — Progress Notes (Signed)
 " PROGRESS NOTE  MONZERAT HANDLER  FMW:[id number] DOB: [birth year] DOA: 03/03/2024 PCP: Cleotilde Planas, MD   Brief Narrative: Patient is a 84 year old female with history of peripheral artery disease status post PCI to SFA, coronary artery disease status post multiple PCI's, hypertension, hyperlipidemia who presented with chest pain.  Rated chest pain as 9/10, sharp on the lower left part of the chest, reproducible with gentle palpation and while taking deep breath.  On presentation, EKG showed A-fib with RVR.  Started on Cardizem  drip, heparin  drip, cardiology consulted.  Remains in A-fib rhythm with controlled rate this morning.  Undergoing DCCV today  Assessment & Plan:  Principal Problem:   Atrial fibrillation with RVR (HCC) Active Problems:   PAD Rt SFA stent 2006 with ISR 5/13. New RSFA stent 05/14/13   CAD- LAD stent in 2006, chronically occluded OM branch with hx. of Stent to RCA    Hyperlipidemia with target LDL less than 70   Essential hypertension   A-flutter with RVR: Initially started on Cardizem /heparin  drip.  TSH  is normal.  Echo showed EF of 55 to 60%, no wall motion abnormality, normal right ventricular function.  Currently on amiodarone  drip, Eliquis .  Underwent DCCV today.  Monitor telemetry.  On oral diltiazem   Chest pain: Atypical/pleuritic chest pain is a possibility.  Troponins negative.  Echo does not show wall motion abnormality.  Has history of coronary artery disease, status post PCI.  Takes Plavix  at home.  Still complains of left-sided chest pain.  Mild elevated D-dimer.  Cardiology ordered CT angiogram , ruled out PE.  Also started on colchicine  0.6 mg twice daily for suspicion of pericarditis which could be contributing to chest pain.  No pericardial effusion seen as per echo.  Respiratory insufficiency: She was put on supplemental oxygen at night.  Lungs are clear on auscultation.  Will try to wean.  CT angiogram of the chest showed   only mall amount of bilateral  pleural fluid with adjacent posterior bibasilar consolidation likely atelectasis and much less likely infection.  Hypertension: Currently blood pressure stable.  Takes losartan , hydrochlorothiazide, hydralazine  at home  Peripheral artery disease: Follows with Dr. Court.  History of multiple interventions.  She was scheduled for potential revascularization/endovascular repair on her distal left SFA.  Dr. Court rescheduled it  Hyperlipidemia: On statin  GERD: Continue PPI  Neuropathy: On Neurontin   Hyponatremia: Improved with normal sodium         DVT prophylaxis: apixaban  (ELIQUIS ) tablet 5 mg  apixaban  (ELIQUIS ) 5 MG tablet     Code Status: Full Code  Family Communication: None at bedside  Patient status:Obs  Patient is from :Home  Anticipated discharge un:ynfz  Estimated DC date:1-2 days   Consultants: Cardiology  Procedures:None  Antimicrobials:  Anti-infectives (From admission, onward)    None       Subjective: Patient seen and examined at bedside today.  Hemodynamically stable.  Remains in A-fib rhythm.  Undergoing DCCV today.  Still complains of left-sided chest pain but bearable.  Not in any kind of distress.  She was put on supplemental oxygen for night..  Lungs are clear on auscultation.    Objective: Vitals:   03/06/24 0800 03/06/24 0942 03/06/24 0950 03/06/24 1030  BP:  115/81  122/62  Pulse: 85 88  98  Resp:  17  20  Temp:  97.7 F (36.5 C)    TempSrc:  Temporal    SpO2: (!) 83% 90% (!) 86% 94%  Weight:  Height:        Intake/Output Summary (Last 24 hours) at 03/06/2024 1111 Last data filed at 03/06/2024 1020 Gross per 24 hour  Intake 773.41 ml  Output --  Net 773.41 ml   Filed Weights   03/03/24 2146  Weight: 61.7 kg    Examination:  General exam: Overall comfortable, not in distress HEENT: PERRL Respiratory system:  no wheezes or crackles  Cardiovascular system: Irregularly  irregular rhythm Gastrointestinal system:  Abdomen is nondistended, soft and nontender. Central nervous system: Alert and oriented Extremities: No edema, no clubbing ,no cyanosis Skin: No rashes, no ulcers,no icterus     Data Reviewed: I have personally reviewed following labs and imaging studies  CBC: Recent Labs  Lab 03/03/24 2156 03/05/24 0350 03/06/24 0524  WBC 11.0* 11.3* 9.5  HGB 13.8 13.2 11.8*  HCT 41.4 39.6 35.2*  MCV 96.7 97.8 97.8  PLT 206 170 166   Basic Metabolic Panel: Recent Labs  Lab 03/03/24 2156 03/03/24 2223 03/04/24 0940 03/05/24 0350 03/06/24 0524  NA 136  --   --  129* 133*  K 3.8  --   --  4.4 4.1  CL 98  --   --  96* 100  CO2 26  --   --  23 23  GLUCOSE 124*  --   --  105* 102*  BUN 15  --   --  19 15  CREATININE 0.74  --   --  0.83 0.93  CALCIUM  9.9  --   --  8.8* 8.6*  MG  --  1.3* 1.9  --   --      No results found for this or any previous visit (from the past 240 hours).   Radiology Studies: EP STUDY Result Date: 03/06/2024 See surgical note for result.  ECHOCARDIOGRAM COMPLETE Result Date: 03/05/2024    ECHOCARDIOGRAM REPORT   Patient Name:   YACINE DROZ Diesing Date of Exam: 03/05/2024 Medical Rec #:  [id number]          Height:       66.1 in Accession #:    [id number]         Weight:       136.0 lb Date of Birth:  1940/12/26           BSA:          1.701 m Patient Age:    83 years           BP:           101/78 mmHg Patient Gender: F                  HR:           96 bpm. Exam Location:  Inpatient Procedure: Cardiac Doppler, Color Doppler, 2D Echo and 3D Echo (Both Spectral            and Color Flow Doppler were utilized during procedure). Indications:    Atrial Fibrillation I48.91  History:        Patient has prior history of Echocardiogram examinations, most                 recent 07/04/2017. CAD, Prior CABG, PAD, Arrythmias:Atrial                 Fibrillation; Risk Factors:Hypertension and Dyslipidemia.  Sonographer:    Koleen Popper RDCS Referring Phys: [id number] DONALDA M GHIMIRE  IMPRESSIONS  1. Left ventricular ejection fraction, by estimation, is 55 to 60%.  The left ventricle has normal function. The left ventricle has no regional wall motion abnormalities. There is mild left ventricular hypertrophy. Left ventricular diastolic function could not be evaluated due to irregular rhythm.  2. Right ventricular systolic function is normal. The right ventricular size is normal. There is mildly elevated pulmonary artery systolic pressure. The estimated right ventricular systolic pressure is 37.1 mmHg.  3. Right atrial size was mildly dilated.  4. The mitral valve is normal in structure. Trivial mitral valve regurgitation. No evidence of mitral stenosis.  5. Tricuspid valve regurgitation is moderate.  6. The aortic valve is tricuspid. Aortic valve regurgitation is not visualized. Aortic valve sclerosis is present, with no evidence of aortic valve stenosis. FINDINGS  Left Ventricle: Left ventricular ejection fraction, by estimation, is 55 to 60%. The left ventricle has normal function. The left ventricle has no regional wall motion abnormalities. 3D ejection fraction reviewed and evaluated as part of the interpretation. Alternate measurement of EF is felt to be most reflective of LV function. The left ventricular internal cavity size was normal in size. There is mild left ventricular hypertrophy. Left ventricular diastolic function could not be evaluated  due to irregular rhythm. Right Ventricle: The right ventricular size is normal. No increase in right ventricular wall thickness. Right ventricular systolic function is normal. There is mildly elevated pulmonary artery systolic pressure. The tricuspid regurgitant velocity is 2.92  m/s, and with an assumed right atrial pressure of 3 mmHg, the estimated right ventricular systolic pressure is 37.1 mmHg. Left Atrium: Left atrial size was normal in size. Right Atrium: Right atrial size was mildly dilated. Pericardium: Trivial pericardial effusion is  present. Mitral Valve: The mitral valve is normal in structure. Trivial mitral valve regurgitation. No evidence of mitral valve stenosis. Tricuspid Valve: The tricuspid valve is grossly normal. Tricuspid valve regurgitation is moderate . No evidence of tricuspid stenosis. Aortic Valve: The aortic valve is tricuspid. Aortic valve regurgitation is not visualized. Aortic valve sclerosis is present, with no evidence of aortic valve stenosis. Pulmonic Valve: The pulmonic valve was grossly normal. Pulmonic valve regurgitation is not visualized. Aorta: The aortic root and ascending aorta are structurally normal, with no evidence of dilitation. IAS/Shunts: The interatrial septum was not well visualized.  LEFT VENTRICLE PLAX 2D LVIDd:         4.90 cm LVIDs:         3.70 cm LV PW:         1.30 cm LV IVS:        1.20 cm LVOT diam:     1.80 cm LV SV:         43 LV SV Index:   25 LVOT Area:     2.54 cm  LV Volumes (MOD) LV vol d, MOD A2C: 69.4 ml LV vol d, MOD A4C: 96.4 ml LV vol s, MOD A2C: 33.5 ml LV vol s, MOD A4C: 41.1 ml LV SV MOD A2C:     35.9 ml LV SV MOD A4C:     96.4 ml LV SV MOD BP:      46.5 ml RIGHT VENTRICLE             IVC RV Basal diam:  3.80 cm     IVC diam: 1.70 cm RV S prime:     10.10 cm/s TAPSE (M-mode): 1.4 cm LEFT ATRIUM             Index        RIGHT ATRIUM  Index LA diam:        4.60 cm 2.71 cm/m   RA Area:     19.50 cm LA Vol (A2C):   36.8 ml 21.64 ml/m  RA Volume:   56.10 ml  32.99 ml/m LA Vol (A4C):   39.4 ml 23.17 ml/m LA Biplane Vol: 40.0 ml 23.52 ml/m  AORTIC VALVE LVOT Vmax:   107.00 cm/s LVOT Vmean:  68.700 cm/s LVOT VTI:    0.170 m  AORTA Ao Root diam: 3.40 cm Ao Asc diam:  3.90 cm TRICUSPID VALVE TR Peak grad:   34.1 mmHg TR Mean grad:   20.0 mmHg TR Vmax:        292.00 cm/s TR Vmean:       214.0 cm/s  SHUNTS Systemic VTI:  0.17 m Systemic Diam: 1.80 cm Sunit Tolia Electronically signed by Madonna Large Signature Date/Time: 03/05/2024/11:26:32 PM    Final    CT Angio Chest  Pulmonary Embolism (PE) W or WO Contrast Result Date: 03/05/2024 CLINICAL DATA:  Pleuritic chest pain and new onset a flutter. Elevated D-dimer. Evaluate for pulmonary embolism. EXAM: CT ANGIOGRAPHY CHEST WITH CONTRAST TECHNIQUE: Multidetector CT imaging of the chest was performed using the standard protocol during bolus administration of intravenous contrast. Multiplanar CT image reconstructions and MIPs were obtained to evaluate the vascular anatomy. RADIATION DOSE REDUCTION: This exam was performed according to the departmental dose-optimization program which includes automated exposure control, adjustment of the mA and/or kV according to patient size and/or use of iterative reconstruction technique. CONTRAST:  75mL OMNIPAQUE  IOHEXOL  350 MG/ML SOLN COMPARISON:  08/03/2021 FINDINGS: Cardiovascular: Mild stable cardiomegaly. Calcified plaque over the left main and 3 vessel coronary arteries. Thoracic aorta is normal in caliber. There is calcified plaque throughout the thoracic aorta. Pulmonary arterial system is well opacified without evidence of pulmonary emboli. Remaining vascular structures are unremarkable. Mediastinum/Nodes: No significant mediastinal or hilar adenopathy. Remaining mediastinal structures are unremarkable. Lungs/Pleura: Stable biapical pleural thickening. Lungs are otherwise adequately inflated. There is a small amount of bilateral pleural fluid with adjacent associated posterior bibasilar consolidation likely atelectasis and much less likely infection. Airways are normal. Upper Abdomen: Images through the upper abdomen demonstrate moderate calcified plaque over the abdominal aorta. No acute findings in the upper abdomen. Musculoskeletal: Focal sclerotic density over a lower thoracic vertebral body unchanged likely a bone island. No acute findings. Review of the MIP images confirms the above findings. IMPRESSION: 1. No evidence of pulmonary embolism. 2. Small amount of bilateral pleural fluid  with adjacent posterior bibasilar consolidation likely atelectasis and much less likely infection. 3. Mild stable cardiomegaly. Atherosclerotic coronary artery disease. 4. Aortic atherosclerosis. Aortic Atherosclerosis (ICD10-I70.0). Electronically Signed   By: Toribio Agreste M.D.   On: 03/05/2024 11:36    Scheduled Meds:  acetaminophen   1,000 mg Oral Q8H   amiodarone   150 mg Intravenous Once   apixaban        apixaban   5 mg Oral BID   colchicine   0.6 mg Oral BID   cyanocobalamin   1,000 mcg Oral Daily   diltiazem   30 mg Oral Q8H   gabapentin   200 mg Oral TID   lidocaine   1 patch Transdermal Q24H   pantoprazole   40 mg Oral BID   polyethylene glycol  17 g Oral Daily   senna  1 tablet Oral BID   sodium chloride  flush  3 mL Intravenous Q12H   Continuous Infusions:  sodium chloride  75 mL/hr at 03/06/24 0217   amiodarone      Followed  by   amiodarone        LOS: 0 days   Ivonne Mustache, MD [facility] P1/23/2026, 11:11 AM  "

## 2024-03-06 NOTE — Op Note (Signed)
 Procedure: Electrical Cardioversion Indications:  Atrial Flutter  Procedure Details:  Consent: Risks of procedure as well as the alternatives and risks of each were explained to the (patient/caregiver).  Consent for procedure obtained.  Time Out: Verified patient identification, verified procedure, site/side was marked, verified correct patient position, special equipment/implants available, medications/allergies/relevent history reviewed, required imaging and test results available.  Performed  Patient placed on cardiac monitor, pulse oximetry, supplemental oxygen as necessary.  Sedation given: propofol  IV, Dr. Maryclare Pacer pads placed anterior and posterior chest.  Cardioverted 1 time(s).  Cardioversion with synchronized biphasic 200J shock.  Evaluation: Findings: Post procedure EKG shows: NSR Complications: Maintained SR for 3-4 minutes, then converted to atrial flutter with 2:1 AV block Patient did tolerate procedure well.  Discussed w Dr. Loni, will start IV amiodarone.  Time Spent Directly with the Patient:  30 minutes   Sara Dominguez 03/06/2024, 10:20 AM

## 2024-03-06 NOTE — H&P (View-Only) (Signed)
 "  Progress Note  Patient Name: Sara Dominguez Date of Encounter: 03/06/2024 [facility] Cardiologist: [name], MD   Interval Summary   Remains in overall rate controlled atrial flutter. Continues to have chest pain. Suspect pericarditis.   Vital Signs Vitals:   03/06/24 0459 03/06/24 0700 03/06/24 0747 03/06/24 0800  BP: 124/67  131/66   Pulse:  (!) 42 (!) 31 85  Resp: 16  17   Temp: 98 F (36.7 C)  99 F (37.2 C)   TempSrc: Oral  Oral   SpO2:  (!) 67% (!) 76% (!) 83%  Weight:      Height:        Intake/Output Summary (Last 24 hours) at 03/06/2024 0848 Last data filed at 03/05/2024 1858 Gross per 24 hour  Intake 863.41 ml  Output --  Net 863.41 ml      03/03/2024    9:46 PM 02/18/2024   10:58 AM 07/13/2023   12:03 PM  Last 3 Weights  Weight (lbs) 136 lb 136 lb 144 lb  Weight (kg) 61.689 kg 61.689 kg 65.318 kg      Telemetry/ECG  Aflutter rates 80s-100s - Personally Reviewed  Physical Exam  GEN: No acute distress.   Neck: No JVD Cardiac: iRRR, no murmurs, rubs, or gallops.  Respiratory: Clear to auscultation bilaterally. GI: Soft, nontender, non-distended  MS: No edema  Assessment & Plan   Atrial flutter with rapid ventricular response, new onset Pleuritic chest pain History of GI bleed, last endoscopy noted 2019 with treated lesions -Normal troponins, D-dimer elevated. ESR 11 normal but CRP elevated 15.7, echo with trivial effusion.  With continued pleuritic chest pain consider pericarditis, unknown etiology, but likely the source of the the aflutter. - CT PE negative. -TSH normal -Start Eliquis  5 mg twice daily.  History of GI bleed.  Counseled on risk. -dilt gtt stopped, now on dilt 30 mg TID -Unclear etiology of onset but did experience chest pain.  We spent quite a bit of time talking about inpatient TEE cardioversion versus deferring to the outpatient after 4 weeks of anticoagulation.  Complicating factor is an upcoming peripheral  intervention on January 30 for which anticoagulation would have to be interrupted presumably.  Discussed with primary cardiologist and her intervention is elective and Dr. Lesches suggested rescheduling in favor of treating atrial flutter. I will reschedule it after we confirm she will go for TEE/DCCV today.  - possible element of pericarditis with elevated inflammatory markers. Will start colchicine 0.6 mg bid to address chest pain. No friction rub.    Peripheral vascular disease -Anticipating peripheral angiography and possible endovascular therapy due to symptomatic PAD on 03/13/2024.  This would likely require interruption in anticoagulation, which would complicate our plan to perform a TEE cardioversion inpatient which would then necessitate 30 days of uninterrupted anticoagulation.  We will start Eliquis  and discussed with the primary cardiologist as to anticipated timing of procedure, see above.  I suspect PV intervention can be delayed slightly to facilitate restoration of sinus rhythm. Dr. Lesches aggres. -Continue Plavix  75 mg daily   Coronary artery disease -Chest pain on admission with normal troponins, chest pain sounds more pleuritic than anginal -Continue Plavix  75 mg daily in addition to now newly added Eliquis  5 mg twice daily   Hypertension -normotensive on dilt 30 mg TID. Continue.   Informed Consent   Shared Decision Making/Informed Consent   The risks [stroke, cardiac arrhythmias rarely resulting in the need for a temporary or permanent pacemaker,  skin irritation or burns, esophageal damage, perforation (1:10,000 risk), bleeding, pharyngeal hematoma as well as other potential complications associated with conscious sedation including aspiration, arrhythmia, respiratory failure and death], benefits (treatment guidance, restoration of normal sinus rhythm, diagnostic support) and alternatives of a transesophageal echocardiogram guided cardioversion were discussed in detail with Ms.  Schear and she is willing to proceed.       For questions or updates, please contact [facility] Please consult [email or web address] for contact info under         Signed, Soyla DELENA Merck, MD   "

## 2024-03-06 NOTE — Op Note (Signed)
 INDICATIONS: Precardioversion AFib  PROCEDURE:   Informed consent was obtained prior to the procedure. The risks, benefits and alternatives for the procedure were discussed and the patient comprehended these risks.  Risks include, but are not limited to, cough, sore throat, vomiting, nausea, somnolence, esophageal and stomach trauma or perforation, bleeding, low blood pressure, aspiration, pneumonia, infection, trauma to the teeth and death.    During this procedure the patient was administered IV propofol  by Anesthesiology, Dr. Maryclare.  The transesophageal probe was inserted in the esophagus and stomach without difficulty and multiple views were obtained.  The patient was kept under observation until the patient left the procedure room.  The patient left the procedure room in stable condition.   Agitated microbubble saline contrast was not administered.  COMPLICATIONS:    There were no immediate complications.  FINDINGS:  No LA thrombus. Normal LV function. Biatrial dilation. Severe atherosclerosis in the descending aorta  RECOMMENDATIONS:     Proceed with cardioversion.  Time Spent Directly with the Patient:  30 minutes   Sara Dominguez 03/06/2024, 10:17 AM

## 2024-03-06 NOTE — Progress Notes (Signed)
 Pt Hr went up the 160's. Pt was ambulating at the time, pt was instructed to lay get back to bed and bear down, Hr went to 140's. Pt was complaining of 9/10 chest pain. Dr alerted, morphine  given and pt placed on 2L

## 2024-03-06 NOTE — Progress Notes (Signed)
 "  Progress Note  Patient Name: Sara Dominguez Date of Encounter: 03/06/2024 [facility] Cardiologist: [name], MD   Interval Summary   Remains in overall rate controlled atrial flutter. Continues to have chest pain. Suspect pericarditis.   Vital Signs Vitals:   03/06/24 0459 03/06/24 0700 03/06/24 0747 03/06/24 0800  BP: 124/67  131/66   Pulse:  (!) 42 (!) 31 85  Resp: 16  17   Temp: 98 F (36.7 C)  99 F (37.2 C)   TempSrc: Oral  Oral   SpO2:  (!) 67% (!) 76% (!) 83%  Weight:      Height:        Intake/Output Summary (Last 24 hours) at 03/06/2024 0848 Last data filed at 03/05/2024 1858 Gross per 24 hour  Intake 863.41 ml  Output --  Net 863.41 ml      03/03/2024    9:46 PM 02/18/2024   10:58 AM 07/13/2023   12:03 PM  Last 3 Weights  Weight (lbs) 136 lb 136 lb 144 lb  Weight (kg) 61.689 kg 61.689 kg 65.318 kg      Telemetry/ECG  Aflutter rates 80s-100s - Personally Reviewed  Physical Exam  GEN: No acute distress.   Neck: No JVD Cardiac: iRRR, no murmurs, rubs, or gallops.  Respiratory: Clear to auscultation bilaterally. GI: Soft, nontender, non-distended  MS: No edema  Assessment & Plan   Atrial flutter with rapid ventricular response, new onset Pleuritic chest pain History of GI bleed, last endoscopy noted 2019 with treated lesions -Normal troponins, D-dimer elevated. ESR 11 normal but CRP elevated 15.7, echo with trivial effusion.  With continued pleuritic chest pain consider pericarditis, unknown etiology, but likely the source of the the aflutter. - CT PE negative. -TSH normal -Start Eliquis  5 mg twice daily.  History of GI bleed.  Counseled on risk. -dilt gtt stopped, now on dilt 30 mg TID -Unclear etiology of onset but did experience chest pain.  We spent quite a bit of time talking about inpatient TEE cardioversion versus deferring to the outpatient after 4 weeks of anticoagulation.  Complicating factor is an upcoming peripheral  intervention on January 30 for which anticoagulation would have to be interrupted presumably.  Discussed with primary cardiologist and her intervention is elective and Dr. Lesches suggested rescheduling in favor of treating atrial flutter. I will reschedule it after we confirm she will go for TEE/DCCV today.  - possible element of pericarditis with elevated inflammatory markers. Will start colchicine 0.6 mg bid to address chest pain. No friction rub.    Peripheral vascular disease -Anticipating peripheral angiography and possible endovascular therapy due to symptomatic PAD on 03/13/2024.  This would likely require interruption in anticoagulation, which would complicate our plan to perform a TEE cardioversion inpatient which would then necessitate 30 days of uninterrupted anticoagulation.  We will start Eliquis  and discussed with the primary cardiologist as to anticipated timing of procedure, see above.  I suspect PV intervention can be delayed slightly to facilitate restoration of sinus rhythm. Dr. Lesches aggres. -Continue Plavix  75 mg daily   Coronary artery disease -Chest pain on admission with normal troponins, chest pain sounds more pleuritic than anginal -Continue Plavix  75 mg daily in addition to now newly added Eliquis  5 mg twice daily   Hypertension -normotensive on dilt 30 mg TID. Continue.   Informed Consent   Shared Decision Making/Informed Consent   The risks [stroke, cardiac arrhythmias rarely resulting in the need for a temporary or permanent pacemaker,  skin irritation or burns, esophageal damage, perforation (1:10,000 risk), bleeding, pharyngeal hematoma as well as other potential complications associated with conscious sedation including aspiration, arrhythmia, respiratory failure and death], benefits (treatment guidance, restoration of normal sinus rhythm, diagnostic support) and alternatives of a transesophageal echocardiogram guided cardioversion were discussed in detail with Ms.  Dominguez and she is willing to proceed.       For questions or updates, please contact [facility] Please consult [email or web address] for contact info under         Signed, Soyla DELENA Merck, MD   "

## 2024-03-06 NOTE — Plan of Care (Signed)

## 2024-03-06 NOTE — Interval H&P Note (Signed)
 History and Physical Interval Note:  03/06/2024 9:55 AM  Sara Dominguez  has presented today for surgery, with the diagnosis of afib.  The various methods of treatment have been discussed with the patient and family. After consideration of risks, benefits and other options for treatment, the patient has consented to  Procedures: TRANSESOPHAGEAL ECHOCARDIOGRAM (N/A) CARDIOVERSION (N/A) as a surgical intervention.  The patient's history has been reviewed, patient examined, no change in status, stable for surgery.  I have reviewed the patient's chart and labs.  Questions were answered to the patient's satisfaction.     Usha Slager

## 2024-03-06 NOTE — Evaluation (Signed)
 Physical Therapy Evaluation Patient Details Name: Sara Dominguez MRN: [id number] DOB: 1941-02-01 Today's Date: 03/06/2024  History of Present Illness  Pt is an 84 y.o. F presenting to [facility] on 03/03/24 with chest pain. Pt found to be in A-fib. PMH is significant for PAD, CAD, HTN, and HLD.  Clinical Impression  Prior to admittance, pt was indep with mobility and ADLs. Pt presents to evaluation with deficits in mobility, power, activity tolerance, pain, and balance. Pt was able to ambulate community level distances without AD and no physical assistance. PT will continue to treat pt while she is admitted. Anticipate no PT needs at discharge.         If plan is discharge home, recommend the following: Assist for transportation;Help with stairs or ramp for entrance   Can travel by private vehicle        Equipment Recommendations None recommended by PT  Recommendations for Other Services       Functional Status Assessment Patient has had a recent decline in their functional status and demonstrates the ability to make significant improvements in function in a reasonable and predictable amount of time.     Precautions / Restrictions Precautions Precautions: Fall Recall of Precautions/Restrictions: Intact Restrictions Weight Bearing Restrictions Per Provider Order: No      Mobility  Bed Mobility Overal bed mobility: Modified Independent             General bed mobility comments: pt received and returned to recliner    Transfers Overall transfer level: Modified independent Equipment used: None               General transfer comment: STS from recliner without AD and no physical assistance. Increased time to complete.    Ambulation/Gait Ambulation/Gait assistance: Supervision Gait Distance (Feet): 400 Feet Assistive device: None Gait Pattern/deviations: Step-through pattern, Decreased stride length, Drifts right/left Gait velocity: reduced Gait velocity  interpretation: <1.8 ft/sec, indicate of risk for recurrent falls   General Gait Details: Pt demonstrates reciprocal gait pattern with reduced gait speed and reduced strike length. At beginning of gait, pt was drifting R/L, but with time, pt no longer demonstrating gait abnormality.  Stairs            Wheelchair Mobility     Tilt Bed    Modified Rankin (Stroke Patients Only)       Balance Overall balance assessment: Needs assistance Sitting-balance support: No upper extremity supported, Feet supported Sitting balance-Leahy Scale: Good Sitting balance - Comments: seated edge of recliner for MMT with no LOB or signs of instability   Standing balance support: No upper extremity supported, During functional activity Standing balance-Leahy Scale: Fair Standing balance comment: able to shift weight throughout gait                             Pertinent Vitals/Pain Pain Assessment Pain Assessment: Faces Faces Pain Scale: Hurts little more Pain Location: L chest and sternal region with movement Pain Descriptors / Indicators: Discomfort, Grimacing, Heaviness Pain Intervention(s): Limited activity within patient's tolerance, Monitored during session    Home Living Family/patient expects to be discharged to:: Private residence Living Arrangements: Spouse/significant other Available Help at Discharge: Family;Available 24 hours/day Type of Home: House Home Access: Stairs to enter Entrance Stairs-Rails: Doctor, General Practice of Steps: 2   Home Layout: One level Home Equipment: None      Prior Function Prior Level of Function : Independent/Modified Independent  Mobility Comments: indep without AD ADLs Comments: indep     Extremity/Trunk Assessment   Upper Extremity Assessment Upper Extremity Assessment: Overall WFL for tasks assessed    Lower Extremity Assessment Lower Extremity Assessment: Overall WFL for tasks assessed     Cervical / Trunk Assessment Cervical / Trunk Assessment: Normal  Communication   Communication Communication: No apparent difficulties    Cognition Arousal: Alert Behavior During Therapy: WFL for tasks assessed/performed   PT - Cognitive impairments: No apparent impairments                         Following commands: Intact       Cueing Cueing Techniques: Verbal cues, Gestural cues     General Comments General comments (skin integrity, edema, etc.): SpO2: 96%, HR: 84-122 bpm    Exercises     Assessment/Plan    PT Assessment Patient needs continued PT services  PT Problem List Decreased activity tolerance;Decreased balance;Decreased mobility;Decreased coordination       PT Treatment Interventions DME instruction;Gait training;Stair training;Functional mobility training;Therapeutic activities;Therapeutic exercise;Balance training;Modalities;Manual techniques    PT Goals (Current goals can be found in the Care Plan section)  Acute Rehab PT Goals Patient Stated Goal: to go home PT Goal Formulation: With patient Time For Goal Achievement: 03/20/24 Potential to Achieve Goals: Good Additional Goals Additional Goal #1: Pt will score > 19 on DGI demonstrating imrpoved balance and reduced risk of falls    Frequency Min 1X/week     Co-evaluation               AM-PAC PT 6 Clicks Mobility  Outcome Measure Help needed turning from your back to your side while in a flat bed without using bedrails?: None Help needed moving from lying on your back to sitting on the side of a flat bed without using bedrails?: None Help needed moving to and from a bed to a chair (including a wheelchair)?: None Help needed standing up from a chair using your arms (e.g., wheelchair or bedside chair)?: A Little Help needed to walk in hospital room?: A Little Help needed climbing 3-5 steps with a railing? : A Little 6 Click Score: 21    End of Session Equipment Utilized  During Treatment: Gait belt Activity Tolerance: Patient tolerated treatment well Patient left: in chair;with call bell/phone within reach;with family/visitor present Nurse Communication: Mobility status PT Visit Diagnosis: Other abnormalities of gait and mobility (R26.89)    Time: 8668-8651 PT Time Calculation (min) (ACUTE ONLY): 17 min   Charges:   PT Evaluation $PT Eval Low Complexity: 1 Low   PT General Charges $$ ACUTE PT VISIT: 1 Visit         Leontine Hilt DPT Acute Rehab Services [phone] Prefer contact via chat   Leontine NOVAK Lindell Renfrew 03/06/2024, 2:27 PM

## 2024-03-06 NOTE — Transfer of Care (Signed)
 Immediate Anesthesia Transfer of Care Note  Patient: Sara Dominguez [facility]  Procedure(s) Performed: TRANSESOPHAGEAL ECHOCARDIOGRAM CARDIOVERSION  Patient Location: Cath Lab  Anesthesia Type:General  Level of Consciousness: awake, alert , and oriented  Airway & Oxygen Therapy: Patient Spontanous Breathing and Patient connected to nasal cannula oxygen  Post-op Assessment: Report given to RN and Post -op Vital signs reviewed and stable  Post vital signs: Reviewed and stable  Last Vitals:  Vitals Value Taken Time  BP 124/80 1028  Temp 36 1028  Pulse 115 1028  Resp 20 1028  SpO2 92 1028    Last Pain:  Vitals:   03/06/24 0942  TempSrc: Temporal  PainSc:          Complications: There were no known notable events for this encounter.

## 2024-03-06 NOTE — Anesthesia Preprocedure Evaluation (Signed)
"                                    Anesthesia Evaluation  Patient identified by MRN, date of birth, ID band Patient awake    Reviewed: Allergy & Precautions, H&P , NPO status , Patient's Chart, lab work & pertinent test results  Airway Mallampati: II   Neck ROM: full    Dental   Pulmonary Current Smoker   breath sounds clear to auscultation       Cardiovascular hypertension, + CAD and + Peripheral Vascular Disease  + dysrhythmias Atrial Fibrillation  Rhythm:irregular Rate:Normal     Neuro/Psych  PSYCHIATRIC DISORDERS Anxiety      Neuromuscular disease    GI/Hepatic   Endo/Other    Renal/GU      Musculoskeletal   Abdominal   Peds  Hematology   Anesthesia Other Findings   Reproductive/Obstetrics                              Anesthesia Physical Anesthesia Plan  ASA: 3  Anesthesia Plan: MAC   Post-op Pain Management:    Induction: Intravenous  PONV Risk Score and Plan: 1 and Propofol  infusion and Treatment may vary due to age or medical condition  Airway Management Planned: Nasal Cannula  Additional Equipment:   Intra-op Plan:   Post-operative Plan:   Informed Consent: I have reviewed the patients History and Physical, chart, labs and discussed the procedure including the risks, benefits and alternatives for the proposed anesthesia with the patient or authorized representative who has indicated his/her understanding and acceptance.     Dental advisory given  Plan Discussed with: CRNA, Anesthesiologist and Surgeon  Anesthesia Plan Comments:         Anesthesia Quick Evaluation  "

## 2024-03-06 NOTE — Anesthesia Postprocedure Evaluation (Signed)
"   Anesthesia Post Note  Patient: Sara Dominguez [facility]  Procedure(s) Performed: TRANSESOPHAGEAL ECHOCARDIOGRAM CARDIOVERSION     Patient location during evaluation: Cath Lab Anesthesia Type: MAC Level of consciousness: awake and alert Pain management: pain level controlled Vital Signs Assessment: post-procedure vital signs reviewed and stable Respiratory status: spontaneous breathing, nonlabored ventilation, respiratory function stable and patient connected to nasal cannula oxygen Cardiovascular status: stable and blood pressure returned to baseline Postop Assessment: no apparent nausea or vomiting Anesthetic complications: no   There were no known notable events for this encounter.  Last Vitals:  Vitals:   03/06/24 1045 03/06/24 1129  BP: (!) 120/53 110/77  Pulse: 82   Resp: (!) 22   Temp:    SpO2: 95%     Last Pain:  Vitals:   03/06/24 1045  TempSrc:   PainSc: 0-No pain                 Daphanie Oquendo S      "

## 2024-03-07 ENCOUNTER — Other Ambulatory Visit (HOSPITAL_COMMUNITY): Payer: Self-pay

## 2024-03-07 DIAGNOSIS — E781 Pure hyperglyceridemia: Secondary | ICD-10-CM | POA: Diagnosis not present

## 2024-03-07 DIAGNOSIS — I4892 Unspecified atrial flutter: Secondary | ICD-10-CM | POA: Diagnosis not present

## 2024-03-07 DIAGNOSIS — I4891 Unspecified atrial fibrillation: Secondary | ICD-10-CM | POA: Diagnosis not present

## 2024-03-07 DIAGNOSIS — K219 Gastro-esophageal reflux disease without esophagitis: Secondary | ICD-10-CM | POA: Diagnosis not present

## 2024-03-07 DIAGNOSIS — G629 Polyneuropathy, unspecified: Secondary | ICD-10-CM | POA: Diagnosis not present

## 2024-03-07 DIAGNOSIS — Z7902 Long term (current) use of antithrombotics/antiplatelets: Secondary | ICD-10-CM | POA: Diagnosis not present

## 2024-03-07 DIAGNOSIS — E785 Hyperlipidemia, unspecified: Secondary | ICD-10-CM | POA: Diagnosis not present

## 2024-03-07 DIAGNOSIS — F17210 Nicotine dependence, cigarettes, uncomplicated: Secondary | ICD-10-CM | POA: Diagnosis not present

## 2024-03-07 DIAGNOSIS — J9690 Respiratory failure, unspecified, unspecified whether with hypoxia or hypercapnia: Secondary | ICD-10-CM | POA: Diagnosis not present

## 2024-03-07 DIAGNOSIS — I2510 Atherosclerotic heart disease of native coronary artery without angina pectoris: Secondary | ICD-10-CM | POA: Diagnosis not present

## 2024-03-07 DIAGNOSIS — I739 Peripheral vascular disease, unspecified: Secondary | ICD-10-CM | POA: Diagnosis not present

## 2024-03-07 DIAGNOSIS — I10 Essential (primary) hypertension: Secondary | ICD-10-CM | POA: Diagnosis not present

## 2024-03-07 LAB — BASIC METABOLIC PANEL WITH GFR
Anion gap: 8 (ref 5–15)
BUN: 12 mg/dL (ref 8–23)
CO2: 24 mmol/L (ref 22–32)
Calcium: 8.7 mg/dL — ABNORMAL LOW (ref 8.9–10.3)
Chloride: 103 mmol/L (ref 98–111)
Creatinine, Ser: 0.68 mg/dL (ref 0.44–1.00)
GFR, Estimated: >60 mL/min
Glucose, Bld: 103 mg/dL — ABNORMAL HIGH (ref 70–99)
Potassium: 4.4 mmol/L (ref 3.5–5.1)
Sodium: 135 mmol/L (ref 135–145)

## 2024-03-07 LAB — CBC
HCT: 33.8 % — ABNORMAL LOW (ref 36.0–46.0)
Hemoglobin: 11.4 g/dL — ABNORMAL LOW (ref 12.0–15.0)
MCH: 32.4 pg (ref 26.0–34.0)
MCHC: 33.7 g/dL (ref 30.0–36.0)
MCV: 96 fL (ref 80.0–100.0)
Platelets: 195 10*3/uL (ref 150–400)
RBC: 3.52 MIL/uL — ABNORMAL LOW (ref 3.87–5.11)
RDW: 14.2 % (ref 11.5–15.5)
WBC: 7.2 10*3/uL (ref 4.0–10.5)
nRBC: 0 % (ref 0.0–0.2)

## 2024-03-07 MED ORDER — COLCHICINE 0.6 MG PO TABS
0.6000 mg | ORAL_TABLET | Freq: Two times a day (BID) | ORAL | 0 refills | Status: DC
  Filled 2024-03-07: qty 60, 30d supply, fill #0

## 2024-03-07 MED ORDER — AMIODARONE HCL 200 MG PO TABS
ORAL_TABLET | ORAL | 0 refills | Status: DC
  Filled 2024-03-07: qty 50, 35d supply, fill #0

## 2024-03-07 MED ORDER — AMIODARONE HCL 200 MG PO TABS: 200.0000 mg | ORAL_TABLET | Freq: Every day | ORAL | Status: DC

## 2024-03-07 MED ORDER — DILTIAZEM HCL ER COATED BEADS 120 MG PO CP24
120.0000 mg | ORAL_CAPSULE | Freq: Every day | ORAL | 0 refills | Status: DC
  Filled 2024-03-07: qty 30, 30d supply, fill #0

## 2024-03-07 MED ORDER — DILTIAZEM HCL ER COATED BEADS 120 MG PO CP24
120.0000 mg | ORAL_CAPSULE | Freq: Every day | ORAL | Status: DC
  Administered 2024-03-07: 120 mg via ORAL
  Filled 2024-03-07: qty 1

## 2024-03-07 MED ORDER — AMIODARONE HCL 200 MG PO TABS
400.0000 mg | ORAL_TABLET | Freq: Two times a day (BID) | ORAL | Status: DC
  Administered 2024-03-07: 400 mg via ORAL
  Filled 2024-03-07: qty 2

## 2024-03-07 MED ORDER — APIXABAN 5 MG PO TABS
5.0000 mg | ORAL_TABLET | Freq: Two times a day (BID) | ORAL | 0 refills | Status: DC
  Filled 2024-03-07: qty 60, 30d supply, fill #0

## 2024-03-07 NOTE — Progress Notes (Signed)
 Discharge Nurse Summary: DC order noted per MD. DC RN at bedside with patient. Patient agreeable with discharge plan, states family will arrive soon for pickup. AVS printed/reviewed. PIV removed, skin intact. No DME needs. No home meds. TOC meds pending pickup. CP/Edu resolved. Telemonitor returned to charging station. All belongings accounted for. See LDAs. Patient wheeled downstairs for discharge by private auto. TOC meds picked up on the way out.    Rosario EMERSON Lund, RN

## 2024-03-07 NOTE — Discharge Summary (Signed)
 Physician Discharge Summary  Sara Dominguez [facility] FMW:[id number] DOB: 1941/02/03 DOA: 03/03/2024  PCP: Cleotilde Planas, MD  Admit date: 03/03/2024 Discharge date: 03/07/2024  Admitted From: Home Disposition:  Home  Discharge Condition:Stable CODE STATUS:FULL Diet recommendation: Heart Healthy   Brief/Interim Summary: Patient is a 84 year old female with history of peripheral artery disease status post PCI to SFA, coronary artery disease status post multiple PCI's, hypertension, hyperlipidemia who presented with chest pain. Rated chest pain as 9/10, sharp on the lower left part of the chest, reproducible with gentle palpation and while taking deep breath. On presentation, EKG showed A-fib with RVR. Started on Cardizem  drip, heparin  drip, cardiology consulted.  Post DCCV on 1/23.  Remains in A-fib rhythm with controlled rate this morning.  Cardiology recommending rate control strategy for now and she will follow-up with cardiology soon as possible as an outpatient.  Medically stable for discharge.  Following problems were addressed during the hospitalization:   A-flutter with RVR: Initially started on Cardizem /heparin  drip.  TSH  is normal.  Echo showed EF of 55 to 60%, no wall motion abnormality, normal right ventricular function.  Started  on amiodarone  drip, Eliquis .  Underwent DCCV.  Remains in A-fib rhythm with controlled rate.  Continue oral amiodarone , oral Cardizem  and Eliquis  on discharge.  Cardiology will follow-up as an outpatient   Chest pain: Atypical/pleuritic chest pain is a possibility.  Troponins negative.  Echo does not show wall motion abnormality.  Has history of coronary artery disease, status post PCI.  Takes Plavix  at home.    Mild elevated D-dimer.  Cardiology ordered CT angiogram , ruled out PE.  Also started on colchicine  0.6 mg twice daily for suspicion of pericarditis which could be contributing to chest pain.  No pericardial effusion seen as per echo.  Chest pain has improved  today.   Respiratory insufficiency: CT angiogram of the chest showed   only mall amount of bilateral pleural fluid with adjacent posterior bibasilar consolidation likely atelectasis and much less likely infection.  This morning, she was on room air   Hypertension: Currently blood pressure stable.  Takes losartan , hydrochlorothiazide.  Losartan  will be discontinued at discharge.   Peripheral artery disease: Follows with Dr. Court.  History of multiple interventions.  She was scheduled for potential revascularization/endovascular repair on her distal left SFA.  Dr. Court rescheduled it   Hyperlipidemia: On statin   GERD: Continue PPI   Neuropathy: On Neurontin    Hyponatremia: Improved with normal sodium    Discharge Diagnoses:  Principal Problem:   Atrial fibrillation with RVR (HCC) Active Problems:   PAD Rt SFA stent 2006 with ISR 5/13. New RSFA stent 05/14/13   CAD- LAD stent in 2006, chronically occluded OM branch with hx. of Stent to RCA    Hyperlipidemia with target LDL less than 70   Essential hypertension    Discharge Instructions  Discharge Instructions     Discharge instructions   Complete by: As directed    1)Please take your medications as instructed 2)Follow up with your PCP in a week 3)Follow up with cardiology as an outpatient.  You have an appointment on 03/11/2024   Increase activity slowly   Complete by: As directed       Allergies as of 03/07/2024   No Known Allergies      Medication List     STOP taking these medications    losartan  100 MG tablet Commonly known as: COZAAR        TAKE these medications  acetaminophen  500 MG tablet Commonly known as: TYLENOL  Take 500-1,000 mg by mouth every 6 (six) hours as needed for mild pain or headache.   alendronate 70 MG tablet Commonly known as: FOSAMAX Take 70 mg by mouth once a week. Take with a full glass of water on an empty stomach.   amiodarone  200 MG tablet Commonly known as:  PACERONE  Take 2 tablets (400 mg total) by mouth 2 (two) times daily for 5 days, THEN 1 tablet (200 mg total) daily. Start taking on: March 07, 2024   apixaban  5 MG Tabs tablet Commonly known as: ELIQUIS  Take 1 tablet (5 mg total) by mouth 2 (two) times daily.   atorvastatin  80 MG tablet Commonly known as: LIPITOR TAKE 1 TABLET EVERY DAY   clopidogrel  75 MG tablet Commonly known as: PLAVIX  TAKE 1 TABLET EVERY DAY   colchicine  0.6 MG tablet Take 1 tablet (0.6 mg total) by mouth 2 (two) times daily.   cyanocobalamin  1000 MCG tablet Commonly known as: VITAMIN B12 Take 1,000 mcg by mouth daily.   diltiazem  120 MG 24 hr capsule Commonly known as: CARDIZEM  CD Take 1 capsule (120 mg total) by mouth daily.   DULoxetine  30 MG capsule Commonly known as: CYMBALTA  TAKE 1 CAPSULE BY MOUTH EVERY DAY   ferrous sulfate  325 (65 FE) MG tablet Take 325 mg by mouth once a week.   Fish Oil 1000 MG Caps Take 1,000 mg by mouth daily.   gabapentin  100 MG capsule Commonly known as: NEURONTIN  TAKE 3 CAPSULES BY MOUTH 3 TIMES DAILY. What changed: See the new instructions.   hydrochlorothiazide 12.5 MG tablet Commonly known as: HYDRODIURIL Take 12.5 mg by mouth daily.   pantoprazole  40 MG tablet Commonly known as: PROTONIX  Take 40 mg by mouth daily.   Vitamin D-3 25 MCG (1000 UT) Caps Take 2,000 Units by mouth daily.        Follow-up Information     Cleotilde Planas, MD. Schedule an appointment as soon as possible for a visit in 1 week(s).   Specialty: Family Medicine Contact information: [address] [place] [address] [phone]                Allergies[1]  Consultations: Cardiology   Procedures/Studies: ECHO TEE Result Date: 03/06/2024    TRANSESOPHOGEAL ECHO REPORT   Patient Name:   Sara Dominguez Date of Exam: 03/06/2024 Medical Rec #:  [id number]          Height:       66.0 in Accession #:    [id number]         Weight:       136.0 lb Date of Birth:   08-10-1940           BSA:          1.697 m Patient Age:    83 years           BP:           125/54 mmHg Patient Gender: F                  HR:           99 bpm. Exam Location:  Inpatient Procedure: Transesophageal Echo, Cardiac Doppler and Color Doppler (Both            Spectral and Color Flow Doppler were utilized during procedure). Indications:    Arrhythmia  History:        Patient has prior history of Echocardiogram  examinations, most                 recent 03/05/2024. CAD; Risk Factors:Hypertension.  Sonographer:    Jayson Gaskins Referring Phys: [id number] LOGAN N LOCKWOOD PROCEDURE: After discussion of the risks and benefits of a TEE, an informed consent was obtained from the patient. The transesophogeal probe was passed without difficulty through the esophogus of the patient. Sedation performed by different physician. The patient was monitored while under deep sedation. Anesthestetic sedation was provided intravenously by Anesthesiology: 130mg  of Propofol . The patient developed no complications during the procedure. A successful direct current cardioversion was performed at 200 joules with 1 attempt.  IMPRESSIONS  1. Left ventricular ejection fraction, by estimation, is 60 to 65%. The left ventricle has normal function. The left ventricle has no regional wall motion abnormalities.  2. Right ventricular systolic function is normal. The right ventricular size is mildly enlarged.  3. Left atrial size was severely dilated. No left atrial/left atrial appendage thrombus was detected.  4. Right atrial size was mildly dilated.  5. The mitral valve is grossly normal. Mild mitral valve regurgitation.  6. The aortic valve is tricuspid. Aortic valve regurgitation is not visualized. Aortic valve sclerosis is present, with no evidence of aortic valve stenosis.  7. There is Moderate (Grade III) protruding plaque involving the aortic arch and descending aorta. FINDINGS  Left Ventricle: Left ventricular ejection fraction, by  estimation, is 60 to 65%. The left ventricle has normal function. The left ventricle has no regional wall motion abnormalities. The left ventricular internal cavity size was normal in size. There is  no left ventricular hypertrophy. Right Ventricle: The right ventricular size is mildly enlarged. No increase in right ventricular wall thickness. Right ventricular systolic function is normal. Left Atrium: Left atrial size was severely dilated. No left atrial/left atrial appendage thrombus was detected. Right Atrium: Right atrial size was mildly dilated. Pericardium: Trivial pericardial effusion is present. Mitral Valve: The mitral valve is grossly normal. Mild mitral valve regurgitation, with centrally-directed jet. Tricuspid Valve: The tricuspid valve is normal in structure. Tricuspid valve regurgitation is mild. Aortic Valve: The aortic valve is tricuspid. Aortic valve regurgitation is not visualized. Aortic valve sclerosis is present, with no evidence of aortic valve stenosis. Pulmonic Valve: The pulmonic valve was grossly normal. Pulmonic valve regurgitation is trivial. Aorta: The aortic root, ascending aorta, aortic arch and descending aorta are all structurally normal, with no evidence of dilitation or obstruction. There is moderate (Grade III) protruding plaque involving the aortic arch and descending aorta. IAS/Shunts: No atrial level shunt detected by color flow Doppler. Additional Comments: There is pleural effusion in the left lateral region. Jerel Balding MD Electronically signed by Jerel Balding MD Signature Date/Time: 03/06/2024/3:12:08 PM    Final    EP STUDY Result Date: 03/06/2024 See surgical note for result.  ECHOCARDIOGRAM COMPLETE Result Date: 03/05/2024    ECHOCARDIOGRAM REPORT   Patient Name:   Sara Dominguez Date of Exam: 03/05/2024 Medical Rec #:  [id number]          Height:       66.1 in Accession #:    [id number]         Weight:       136.0 lb Date of Birth:  26-Feb-1940           BSA:           1.701 m Patient Age:    39 years  BP:           101/78 mmHg Patient Gender: F                  HR:           96 bpm. Exam Location:  Inpatient Procedure: Cardiac Doppler, Color Doppler, 2D Echo and 3D Echo (Both Spectral            and Color Flow Doppler were utilized during procedure). Indications:    Atrial Fibrillation I48.91  History:        Patient has prior history of Echocardiogram examinations, most                 recent 07/04/2017. CAD, Prior CABG, PAD, Arrythmias:Atrial                 Fibrillation; Risk Factors:Hypertension and Dyslipidemia.  Sonographer:    Koleen Popper RDCS Referring Phys: [id number] DONALDA M GHIMIRE IMPRESSIONS  1. Left ventricular ejection fraction, by estimation, is 55 to 60%. The left ventricle has normal function. The left ventricle has no regional wall motion abnormalities. There is mild left ventricular hypertrophy. Left ventricular diastolic function could not be evaluated due to irregular rhythm.  2. Right ventricular systolic function is normal. The right ventricular size is normal. There is mildly elevated pulmonary artery systolic pressure. The estimated right ventricular systolic pressure is 37.1 mmHg.  3. Right atrial size was mildly dilated.  4. The mitral valve is normal in structure. Trivial mitral valve regurgitation. No evidence of mitral stenosis.  5. Tricuspid valve regurgitation is moderate.  6. The aortic valve is tricuspid. Aortic valve regurgitation is not visualized. Aortic valve sclerosis is present, with no evidence of aortic valve stenosis. FINDINGS  Left Ventricle: Left ventricular ejection fraction, by estimation, is 55 to 60%. The left ventricle has normal function. The left ventricle has no regional wall motion abnormalities. 3D ejection fraction reviewed and evaluated as part of the interpretation. Alternate measurement of EF is felt to be most reflective of LV function. The left ventricular internal cavity size was normal in size. There  is mild left ventricular hypertrophy. Left ventricular diastolic function could not be evaluated  due to irregular rhythm. Right Ventricle: The right ventricular size is normal. No increase in right ventricular wall thickness. Right ventricular systolic function is normal. There is mildly elevated pulmonary artery systolic pressure. The tricuspid regurgitant velocity is 2.92  m/s, and with an assumed right atrial pressure of 3 mmHg, the estimated right ventricular systolic pressure is 37.1 mmHg. Left Atrium: Left atrial size was normal in size. Right Atrium: Right atrial size was mildly dilated. Pericardium: Trivial pericardial effusion is present. Mitral Valve: The mitral valve is normal in structure. Trivial mitral valve regurgitation. No evidence of mitral valve stenosis. Tricuspid Valve: The tricuspid valve is grossly normal. Tricuspid valve regurgitation is moderate . No evidence of tricuspid stenosis. Aortic Valve: The aortic valve is tricuspid. Aortic valve regurgitation is not visualized. Aortic valve sclerosis is present, with no evidence of aortic valve stenosis. Pulmonic Valve: The pulmonic valve was grossly normal. Pulmonic valve regurgitation is not visualized. Aorta: The aortic root and ascending aorta are structurally normal, with no evidence of dilitation. IAS/Shunts: The interatrial septum was not well visualized.  LEFT VENTRICLE PLAX 2D LVIDd:         4.90 cm LVIDs:         3.70 cm LV PW:         1.30 cm LV  IVS:        1.20 cm LVOT diam:     1.80 cm LV SV:         43 LV SV Index:   25 LVOT Area:     2.54 cm  LV Volumes (MOD) LV vol d, MOD A2C: 69.4 ml LV vol d, MOD A4C: 96.4 ml LV vol s, MOD A2C: 33.5 ml LV vol s, MOD A4C: 41.1 ml LV SV MOD A2C:     35.9 ml LV SV MOD A4C:     96.4 ml LV SV MOD BP:      46.5 ml RIGHT VENTRICLE             IVC RV Basal diam:  3.80 cm     IVC diam: 1.70 cm RV S prime:     10.10 cm/s TAPSE (M-mode): 1.4 cm LEFT ATRIUM             Index        RIGHT ATRIUM            Index LA diam:        4.60 cm 2.71 cm/m   RA Area:     19.50 cm LA Vol (A2C):   36.8 ml 21.64 ml/m  RA Volume:   56.10 ml  32.99 ml/m LA Vol (A4C):   39.4 ml 23.17 ml/m LA Biplane Vol: 40.0 ml 23.52 ml/m  AORTIC VALVE LVOT Vmax:   107.00 cm/s LVOT Vmean:  68.700 cm/s LVOT VTI:    0.170 m  AORTA Ao Root diam: 3.40 cm Ao Asc diam:  3.90 cm TRICUSPID VALVE TR Peak grad:   34.1 mmHg TR Mean grad:   20.0 mmHg TR Vmax:        292.00 cm/s TR Vmean:       214.0 cm/s  SHUNTS Systemic VTI:  0.17 m Systemic Diam: 1.80 cm Sunit Tolia Electronically signed by Madonna Large Signature Date/Time: 03/05/2024/11:26:32 PM    Final    CT Angio Chest Pulmonary Embolism (PE) W or WO Contrast Result Date: 03/05/2024 CLINICAL DATA:  Pleuritic chest pain and new onset a flutter. Elevated D-dimer. Evaluate for pulmonary embolism. EXAM: CT ANGIOGRAPHY CHEST WITH CONTRAST TECHNIQUE: Multidetector CT imaging of the chest was performed using the standard protocol during bolus administration of intravenous contrast. Multiplanar CT image reconstructions and MIPs were obtained to evaluate the vascular anatomy. RADIATION DOSE REDUCTION: This exam was performed according to the departmental dose-optimization program which includes automated exposure control, adjustment of the mA and/or kV according to patient size and/or use of iterative reconstruction technique. CONTRAST:  75mL OMNIPAQUE  IOHEXOL  350 MG/ML SOLN COMPARISON:  08/03/2021 FINDINGS: Cardiovascular: Mild stable cardiomegaly. Calcified plaque over the left main and 3 vessel coronary arteries. Thoracic aorta is normal in caliber. There is calcified plaque throughout the thoracic aorta. Pulmonary arterial system is well opacified without evidence of pulmonary emboli. Remaining vascular structures are unremarkable. Mediastinum/Nodes: No significant mediastinal or hilar adenopathy. Remaining mediastinal structures are unremarkable. Lungs/Pleura: Stable biapical pleural thickening. Lungs  are otherwise adequately inflated. There is a small amount of bilateral pleural fluid with adjacent associated posterior bibasilar consolidation likely atelectasis and much less likely infection. Airways are normal. Upper Abdomen: Images through the upper abdomen demonstrate moderate calcified plaque over the abdominal aorta. No acute findings in the upper abdomen. Musculoskeletal: Focal sclerotic density over a lower thoracic vertebral body unchanged likely a bone island. No acute findings. Review of the MIP images confirms the above findings. IMPRESSION: 1.  No evidence of pulmonary embolism. 2. Small amount of bilateral pleural fluid with adjacent posterior bibasilar consolidation likely atelectasis and much less likely infection. 3. Mild stable cardiomegaly. Atherosclerotic coronary artery disease. 4. Aortic atherosclerosis. Aortic Atherosclerosis (ICD10-I70.0). Electronically Signed   By: Toribio Agreste M.D.   On: 03/05/2024 11:36   DG Chest Port 1 View Result Date: 03/03/2024 EXAM: 2 Views XRAY of the Chest 03/03/2024 10:15:00 PM COMPARISON: CT 08/03/2021. CLINICAL HISTORY: 355200 Chest pain [id number] cp cp 355200 Chest pain [id number] FINDINGS: LUNGS AND PLEURA: Nodular densities project over both upper lobes, new since prior study. No pleural effusion. No pneumothorax. HEART AND MEDIASTINUM: No acute abnormality of the cardiac and mediastinal silhouettes. Aortic atherosclerosis. BONES AND SOFT TISSUES: No acute osseous abnormality. IMPRESSION: 1. Nodular densities project over both upper lobes, new since prior study. This could be further evaluated with chest CT. Electronically signed by: Franky Crease MD 03/03/2024 10:19 PM EST RP Workstation: [id number]      Subjective: Patient seen and examined at bedside today.  Hemodynamically stable.  Chest pain better today.  On room air.  Heart rate well-controlled, in A-fib rhythm.  Eager to go home.  Medically stable for discharge  Discharge Exam: Vitals:    03/07/24 0533 03/07/24 0807  BP: 112/65 136/71  Pulse:  75  Resp:  15  Temp:  97.9 F (36.6 C)  SpO2:  93%   Vitals:   03/07/24 0020 03/07/24 0405 03/07/24 0533 03/07/24 0807  BP: 116/73  112/65 136/71  Pulse:    75  Resp: 16 17  15   Temp: 97.7 F (36.5 C) 98 F (36.7 C)  97.9 F (36.6 C)  TempSrc: Oral Oral  Oral  SpO2: 93%   93%  Weight:      Height:        General: Pt is alert, awake, not in acute distress Cardiovascular: irregularly irregular rhythm Respiratory: CTA bilaterally, no wheezing, no rhonchi Abdominal: Soft, NT, ND, bowel sounds + Extremities: no edema, no cyanosis    The results of significant diagnostics from this hospitalization (including imaging, microbiology, ancillary and laboratory) are listed below for reference.     Microbiology: No results found for this or any previous visit (from the past 240 hours).   Labs: BNP (last 3 results) No results for input(s): BNP in the last 8760 hours. Basic Metabolic Panel: Recent Labs  Lab 03/03/24 2156 03/03/24 2223 03/04/24 0940 03/05/24 0350 03/06/24 0524 03/07/24 0444  NA 136  --   --  129* 133* 135  K 3.8  --   --  4.4 4.1 4.4  CL 98  --   --  96* 100 103  CO2 26  --   --  23 23 24   GLUCOSE 124*  --   --  105* 102* 103*  BUN 15  --   --  19 15 12   CREATININE 0.74  --   --  0.83 0.93 0.68  CALCIUM  9.9  --   --  8.8* 8.6* 8.7*  MG  --  1.3* 1.9  --   --   --    Liver Function Tests: Recent Labs  Lab 03/05/24 0350  AST 16  ALT 8  ALKPHOS 86  BILITOT 0.8  PROT 6.2*  ALBUMIN 3.3*   No results for input(s): LIPASE, AMYLASE in the last 168 hours. No results for input(s): AMMONIA in the last 168 hours. CBC: Recent Labs  Lab 03/03/24 2156 03/05/24 0350 03/06/24 0524 03/07/24 0444  WBC  11.0* 11.3* 9.5 7.2  HGB 13.8 13.2 11.8* 11.4*  HCT 41.4 39.6 35.2* 33.8*  MCV 96.7 97.8 97.8 96.0  PLT 206 170 166 195   Cardiac Enzymes: No results for input(s): CKTOTAL, CKMB,  CKMBINDEX, TROPONINI in the last 168 hours. BNP: Invalid input(s): POCBNP CBG: No results for input(s): GLUCAP in the last 168 hours. D-Dimer Recent Labs    03/04/24 1838  DDIMER 1.46*   Hgb A1c No results for input(s): HGBA1C in the last 72 hours. Lipid Profile No results for input(s): CHOL, HDL, LDLCALC, TRIG, CHOLHDL, LDLDIRECT in the last 72 hours. Thyroid  function studies Recent Labs    03/04/24 1838  TSH 1.210   Anemia work up No results for input(s): VITAMINB12, FOLATE, FERRITIN, TIBC, IRON , RETICCTPCT in the last 72 hours. Urinalysis    Component Value Date/Time   COLORURINE YELLOW 07/13/2023 1247   APPEARANCEUR CLEAR 07/13/2023 1247   LABSPEC 1.040 (H) 07/13/2023 1247   PHURINE 7.0 07/13/2023 1247   GLUCOSEU NEGATIVE 07/13/2023 1247   HGBUR TRACE (A) 07/13/2023 1247   BILIRUBINUR NEGATIVE 07/13/2023 1247   KETONESUR NEGATIVE 07/13/2023 1247   PROTEINUR NEGATIVE 07/13/2023 1247   NITRITE POSITIVE (A) 07/13/2023 1247   LEUKOCYTESUR LARGE (A) 07/13/2023 1247   Sepsis Labs Recent Labs  Lab 03/03/24 2156 03/05/24 0350 03/06/24 0524 03/07/24 0444  WBC 11.0* 11.3* 9.5 7.2   Microbiology No results found for this or any previous visit (from the past 240 hours).  Please note: You were cared for by a hospitalist during your hospital stay. Once you are discharged, your primary care physician will handle any further medical issues. Please note that NO REFILLS for any discharge medications will be authorized once you are discharged, as it is imperative that you return to your primary care physician (or establish a relationship with a primary care physician if you do not have one) for your post hospital discharge needs so that they can reassess your need for medications and monitor your lab values.    Time coordinating discharge: 40 minutes  SIGNED:   Ivonne Mustache, MD  [facility] 03/07/2024, 10:32 AM Pager  [phone]  If 7PM-7AM, please contact night-coverage [email or web address] Password [email or web address]    [1] No Known Allergies

## 2024-03-07 NOTE — Progress Notes (Addendum)
 "  Progress Note  Patient Name: Sara Dominguez Date of Encounter: 03/07/2024 [facility] Cardiologist: [name], MD   Interval Summary   Remains in overall rate controlled atrial flutter. Suspect pericarditis - chest pain improved with colchicine .  Vital Signs Vitals:   03/06/24 2048 03/07/24 0020 03/07/24 0405 03/07/24 0533  BP: 114/64 116/73  112/65  Pulse:      Resp:  16 17   Temp:  97.7 F (36.5 C) 98 F (36.7 C)   TempSrc:  Oral Oral   SpO2:  93%    Weight:      Height:        Intake/Output Summary (Last 24 hours) at 03/07/2024 0558 Last data filed at 03/06/2024 1804 Gross per 24 hour  Intake 757.95 ml  Output --  Net 757.95 ml      03/03/2024    9:46 PM 02/18/2024   10:58 AM 07/13/2023   12:03 PM  Last 3 Weights  Weight (lbs) 136 lb 136 lb 144 lb  Weight (kg) 61.689 kg 61.689 kg 65.318 kg      Telemetry/ECG  Aflutter rates 80s, rare PVC - Personally Reviewed  Physical Exam  GEN: No acute distress.   Neck: No JVD Cardiac: iRRR, no murmurs, rubs, or gallops.  Respiratory: Clear to auscultation bilaterally. GI: Soft, nontender, non-distended  MS: No edema  Assessment & Plan   Atrial flutter with rapid ventricular response, new onset Pleuritic chest pain History of GI bleed, last endoscopy noted 2019 with treated lesions -Normal troponins, D-dimer elevated. ESR 11 normal but CRP elevated 15.7, echo with trivial effusion.  With continued pleuritic chest pain consider pericarditis, unknown etiology, but likely the source of the the aflutter. - CT PE negative. -TSH normal - Eliquis  5 mg twice daily.  History of GI bleed.  Counseled on risk. -dilt consolidated to 120 mg daily. - failed cardioversion with only brief restoration of SR for 3-4 minutes 03/06/24. On IV amio, transition to amio 400 mg BID for 5 days and then 200 mg daily.  - arrange OUTPATIENT DCCV on 03/11/24 with me.  -Unclear etiology of onset but did experience chest pain.  -  possible element of pericarditis with elevated inflammatory markers. Continue colchicine  0.6 mg bid to address chest pain for 3 mo. No friction rub. Chest pain improved with colchicine .   Peripheral vascular disease -Anticipating peripheral angiography and possible endovascular therapy due to symptomatic PAD on 03/13/2024.  This would likely require interruption in anticoagulation, which would complicate our plan to perform a TEE cardioversion inpatient which would then necessitate 30 days of uninterrupted anticoagulation.  We will start Eliquis  and discussed with the primary cardiologist as to anticipated timing of procedure, see above.  I suspect PV intervention can be delayed slightly to facilitate restoration of sinus rhythm. Dr. Lesches agrees. - reach out to Kryestyn Grant RN on 03/09/24 to reschedule procedure. Staff message has been sent.  -Continue Plavix  75 mg daily   Coronary artery disease -Chest pain on admission with normal troponins, chest pain sounds more pleuritic than anginal -Continue Plavix  75 mg daily in addition to now newly added Eliquis  5 mg twice daily   Hypertension -normotensive on dilt po.  - ok to resume hydrochlorothiazide at discharge. Hold Losartan  until follow up.   Discussed in detail, SDM, pt and husband comfortable with plan and prefer to leave today if stable before the snow storm. She is stable for hospital dc from CV perspective today with the above med  recommendations.  Plan for outpatient DCCV 03/11/24 with me after amio load.    For questions or updates, please contact [facility] Please consult [email or web address] for contact info under       Signed, Soyla DELENA Merck, MD   "

## 2024-03-07 NOTE — Plan of Care (Signed)
  Problem: Clinical Measurements: Goal: Respiratory complications will improve Outcome: Progressing Goal: Cardiovascular complication will be avoided Outcome: Progressing   Problem: Activity: Goal: Risk for activity intolerance will decrease Outcome: Progressing   Problem: Nutrition: Goal: Adequate nutrition will be maintained Outcome: Progressing   Problem: Coping: Goal: Level of anxiety will decrease Outcome: Progressing   Problem: Pain Managment: Goal: General experience of comfort will improve and/or be controlled Outcome: Progressing   Problem: Safety: Goal: Ability to remain free from injury will improve Outcome: Progressing

## 2024-03-07 NOTE — TOC Transition Note (Signed)
 Transition of Care [facility]) - Discharge Note   Patient Details  Name: Sara Dominguez MRN: [id number] Date of Birth: [birth year]  Transition of Care [facility]) CM/SW Contact:  Tom-Johnson, Harvest Muskrat, RN Phone Number: 03/07/2024, 10:43 AM   Clinical Narrative:     Patient is scheduled for discharge today.  Outpatient f/u, hospital f/u and discharge instructions on AVS. Prescriptions sent to [facility] pharmacy and patient will receive meds prior discharge. No ICM needs or recommendations noted. Husband, Sindy will transport at discharge.  No further ICM needs noted.     Final next level of care: Home/Self Care Barriers to Discharge: Barriers Resolved   Patient Goals and CMS Choice Patient states their goals for this hospitalization and ongoing recovery are:: To return home CMS Medicare.gov Compare Post Acute Care list provided to:: Patient Choice offered to / list presented to : NA      Discharge Placement                Patient to be transferred to facility by: Husband Name of family member notified: [facility]    Discharge Plan and Services Additional resources added to the After Visit Summary for                  DME Arranged: N/A DME Agency: NA       HH Arranged: NA HH Agency: NA        Social Drivers of Health (SDOH) Interventions SDOH Screenings   Food Insecurity: No Food Insecurity (03/04/2024)  Housing: Low Risk (03/04/2024)  Transportation Needs: No Transportation Needs (03/04/2024)  Utilities: Not At Risk (03/04/2024)  Financial Resource Strain: Low Risk (10/13/2021)   Received from [facility]  Physical Activity: Inactive (10/13/2021)   Received from [facility]  Social Connections: Socially Integrated (03/04/2024)  Stress: No Stress Concern Present (10/13/2021)   Received from [facility]  Tobacco Use: High Risk (03/04/2024)     Readmission Risk Interventions     No data to display

## 2024-03-08 ENCOUNTER — Encounter (HOSPITAL_COMMUNITY): Payer: Self-pay | Admitting: Cardiovascular Disease

## 2024-03-08 NOTE — Pre-Procedure Instructions (Signed)
 Had message from Dr Loni,  patient is going home and needs to be set up for outpatient CV on 1/28.  I spoke with patient told her to arrive @ 0700 for her Cardioversion on 1/28.  She takes Eliquis  twice a day, hasn't missed any doses.  Nothing to eat or drink after midnight.

## 2024-03-11 ENCOUNTER — Encounter (HOSPITAL_COMMUNITY): Payer: Self-pay | Admitting: Certified Registered Nurse Anesthetist

## 2024-03-11 ENCOUNTER — Encounter: Admission: RE | Payer: Self-pay | Attending: Emergency Medicine

## 2024-03-11 ENCOUNTER — Encounter (HOSPITAL_COMMUNITY): Payer: Self-pay | Admitting: Internal Medicine

## 2024-03-11 ENCOUNTER — Ambulatory Visit (HOSPITAL_COMMUNITY)
Admission: RE | Admit: 2024-03-11 | Discharge: 2024-03-11 | Disposition: A | Discharge status: Home / Self Care | Attending: Internal Medicine | Admitting: Internal Medicine

## 2024-03-11 MED ORDER — AMIODARONE HCL 200 MG PO TABS: ORAL_TABLET | ORAL | 0 refills | Status: AC

## 2024-03-11 MED ORDER — COLCHICINE 0.6 MG PO TABS: 0.3000 mg | ORAL_TABLET | Freq: Two times a day (BID) | ORAL | 0 refills | Status: AC

## 2024-03-11 NOTE — Progress Notes (Signed)
 Patient presented in SR. Due to nausea I adjusted amiodarone  today to 200 mg daily.  Due to diarrhea, I adjusted colchicine  to 0.3 mg BID.  F/u with afib clinic 03/19/24.  Pt understands and is in agreement with plan.   Nathanael Krist A Kodi Steil, MD

## 2024-03-13 ENCOUNTER — Ambulatory Visit (HOSPITAL_COMMUNITY): Admit: 2024-03-13 | Admitting: Cardiovascular Disease

## 2024-03-13 ENCOUNTER — Encounter (HOSPITAL_COMMUNITY): Admission: RE | Payer: Self-pay | Source: Home / Self Care

## 2024-03-17 ENCOUNTER — Encounter (HOSPITAL_COMMUNITY): Payer: Self-pay | Admitting: Internal Medicine

## 2024-03-19 ENCOUNTER — Encounter (HOSPITAL_COMMUNITY): Payer: Self-pay | Admitting: Nurse Practitioner

## 2024-03-19 ENCOUNTER — Ambulatory Visit (HOSPITAL_COMMUNITY)
Admission: RE | Admit: 2024-03-19 | Discharge: 2024-03-19 | Disposition: A | Source: Ambulatory Visit | Attending: Nurse Practitioner | Admitting: Nurse Practitioner

## 2024-03-19 VITALS — BP 142/60 | HR 59 | Ht 66.0 in | Wt 133.8 lb

## 2024-03-19 DIAGNOSIS — D6859 Other primary thrombophilia: Secondary | ICD-10-CM

## 2024-03-19 DIAGNOSIS — I739 Peripheral vascular disease, unspecified: Secondary | ICD-10-CM

## 2024-03-19 DIAGNOSIS — I714 Abdominal aortic aneurysm, without rupture, unspecified: Secondary | ICD-10-CM

## 2024-03-19 DIAGNOSIS — Z9229 Personal history of other drug therapy: Secondary | ICD-10-CM

## 2024-03-19 DIAGNOSIS — I4892 Unspecified atrial flutter: Secondary | ICD-10-CM | POA: Diagnosis not present

## 2024-03-19 DIAGNOSIS — I4891 Unspecified atrial fibrillation: Secondary | ICD-10-CM

## 2024-03-19 DIAGNOSIS — I2510 Atherosclerotic heart disease of native coronary artery without angina pectoris: Secondary | ICD-10-CM | POA: Diagnosis not present

## 2024-03-19 MED ORDER — APIXABAN 5 MG PO TABS: 5.0000 mg | ORAL_TABLET | Freq: Two times a day (BID) | ORAL | 1 refills | Status: AC

## 2024-03-19 MED ORDER — DILTIAZEM HCL ER COATED BEADS 120 MG PO CP24: 120.0000 mg | ORAL_CAPSULE | Freq: Every day | ORAL | 1 refills | Status: AC

## 2024-03-19 NOTE — Addendum Note (Signed)
 Encounter addended by: Wyn Jackee VEAR Mickey., NP on: 03/19/2024 3:17 PM  Actions taken: Clinical Note Signed

## 2024-03-19 NOTE — Progress Notes (Addendum)
 "  Primary Care Physician: Cleotilde Planas, MD Primary Cardiologist: [name], MD Electrophysiologist: None  Referring Physician: [name], MD  Sara Dominguez is a 84 y.o. female with a history of new onset atrial flutter, CAD s/p PCI to LAD 2006 and 2 DES to RCA in 2013, PAD s/p right SFA stent, HTN, HLD, AAA, CAD moderate left ICA stenosis atrial tach, tobacco abuse, GI bleed, COPD who presents for follow up in the [facility].    Sara Dominguez  was initially diagnosed with atrial a flutter on 03/03/2024 when presenting to the ED with complaint of chest pain. EKG was completed showing atrial flutter with RVR and she was started on Eliquis  along with Cardizem  drip. She had normal troponins with pain sounding more pleuritic than anginal.  She underwent TEE/DCCV on 03/06/2024  with brief conversion to sinus and early return to flutter. She was loaded on amiodarone  with plan to pursue outpatient DCCV on 03/11/2024.  She was also started on colchicine  0.6 mg twice daily for suspicion of pericarditis.  She presented on 03/03/2024 and was in sinus rhythm.  She reported some GI upset with amiodarone  with medication adjusted to 200 mg once daily  as well as colchicine  reduced to 0.3 mg daily patient presents today for follow up for atrial fibrillation.   Sara Dominguez presents today for hospital follow-up.  On examination today patient is in sinus rhythm however EKG does show prolonged QT interval.  She was recently started on amiodarone  and experiences nausea and has had diarrhea, which she reports has mostly resolved.  I have reached out to Dr. Inocencio  for verification of QT interval and if normal she will continue medications however if abnormal we will discontinue.  No heart racing, flutters, or chest pain. She has not missed any doses of her blood thinner, Eliquis . She has a history of high blood pressure and is currently on diltiazem  (Cardizem ). She is uncertain about  which medication is causing her nausea and diarrhea.She is scheduled for to see Dr. Darron for PAD follow-up   Discussed the use of AI scribe software for clinical note transcription with the patient, who gave verbal consent to proceed.   Atrial Fibrillation Management history: Previous antiarrhythmic drugs: Amiodarone  Previous cardioversions: 03/06/2024 with ERAF Previous ablations: None Anticoagulation history: Eliquis , patient also on Plavix  for history of CAD/PAD  ROS- All systems are reviewed and negative except as per the HPI above.  Past Medical History:  Diagnosis Date   AAA (abdominal aortic aneurysm), small, history of 2.3 X 2.4 07/06/2011   Abnormal ankle brachial index 07/06/2011   Anxiety    CAD (coronary artery disease), with LAD stent in 2006, chronically occluded OM branch with hx. of Stent to RCA  07/06/2011   Claudication in peripheral vascular disease, life style limiting 07/06/2011   Hyperlipidemia LDL goal < 70 09/08/2012   Hypertension    PAD (peripheral artery disease)    Past Surgical History:  Procedure Laterality Date   ABDOMINAL AORTAGRAM N/A 07/05/2011   Procedure: ABDOMINAL EZELLA;  Surgeon: Dorn JINNY Lesches, MD;  Location: [facility] CATH LAB;  Service: Cardiovascular;  Laterality: N/A;   ABDOMINAL HYSTERECTOMY  1992   CARDIOVERSION N/A 03/06/2024   Procedure: CARDIOVERSION;  Surgeon: Francyne Headland, MD;  Location: [facility] INVASIVE CV LAB;  Service: Cardiovascular;  Laterality: N/A;   CATARACT EXTRACTION W/ INTRAOCULAR LENS  IMPLANT, BILATERAL Bilateral ~ 2006   CORONARY ANGIOPLASTY WITH STENT PLACEMENT  2006; 2009; 2013   1 +  1 + 2;  total of 4   ENTEROSCOPY N/A 08/29/2017   Procedure: ENTEROSCOPY;  Surgeon: Donnald Charleston, MD;  Location: [facility] ENDOSCOPY;  Service: Endoscopy;  Laterality: N/A;   ESOPHAGOGASTRODUODENOSCOPY (EGD) WITH PROPOFOL  N/A 06/07/2016   Procedure: ESOPHAGOGASTRODUODENOSCOPY (EGD) WITH PROPOFOL ;  Surgeon: Charleston Donnald, MD;  Location: [facility]  ENDOSCOPY;  Service: Endoscopy;  Laterality: N/A;   FEMORAL ARTERY STENT Right 2006; 07/05/2011; 05/14/2013   FEMORAL ARTERY STENT Right 05/14/13   between previous stens   HOT HEMOSTASIS N/A 08/29/2017   Procedure: HOT HEMOSTASIS (ARGON PLASMA COAGULATION/BICAP);  Surgeon: Donnald Charleston, MD;  Location: [facility];  Service: Endoscopy;  Laterality: N/A;   LOWER EXTREMITY ANGIOGRAM N/A 07/05/2011   Procedure: LOWER EXTREMITY ANGIOGRAM;  Surgeon: Dorn JINNY Lesches, MD;  Location: [facility] CATH LAB;  Service: Cardiovascular;  Laterality: N/A;   LOWER EXTREMITY ANGIOGRAM N/A 05/14/2013   Procedure: LOWER EXTREMITY ANGIOGRAM;  Surgeon: Dorn JINNY Lesches, MD;  Location: [facility] CATH LAB;  Service: Cardiovascular;  Laterality: N/A;   LUMBAR DISC SURGERY  1980;; 1982   PERCUTANEOUS STENT INTERVENTION Right 07/05/2011   Procedure: PERCUTANEOUS STENT INTERVENTION;  Surgeon: Dorn JINNY Lesches, MD;  Location: [facility] CATH LAB;  Service: Cardiovascular;  Laterality: Right;   PERIPHERAL VASCULAR CATHETERIZATION N/A 11/04/2014   Procedure: Lower Extremity Angiography;  Surgeon: Dorn JINNY Lesches, MD;  Location: [facility] INVASIVE CV LAB;  Service: Cardiovascular;  Laterality: N/A;   PERIPHERAL VASCULAR CATHETERIZATION Right 11/04/2014   Procedure: Peripheral Vascular Intervention;  Surgeon: Dorn JINNY Lesches, MD;  Location: [facility] INVASIVE CV LAB;  Service: Cardiovascular;  Laterality: Right;  SFA   SHOULDER OPEN ROTATOR CUFF REPAIR Left ~ 2004   TRANSESOPHAGEAL ECHOCARDIOGRAM (CATH LAB) N/A 03/06/2024   Procedure: TRANSESOPHAGEAL ECHOCARDIOGRAM;  Surgeon: Francyne Headland, MD;  Location: [facility] INVASIVE CV LAB;  Service: Cardiovascular;  Laterality: N/A;   Patient has no known allergies. Current Outpatient Medications  Medication Sig Dispense Refill   acetaminophen  (TYLENOL ) 500 MG tablet Take 500-1,000 mg by mouth every 6 (six) hours as needed for mild pain or headache. (Patient taking differently: Take 500-1,000 mg by mouth as needed for mild pain  (pain score 1-3) or headache.)     alendronate (FOSAMAX) 70 MG tablet Take 70 mg by mouth once a week. Take with a full glass of water on an empty stomach.     amiodarone  (PACERONE ) 200 MG tablet Take 1 tablet (200 mg total) by mouth daily for 90 days, THEN 1 tablet (200 mg total) daily. 180 tablet 0   atorvastatin  (LIPITOR) 80 MG tablet TAKE 1 TABLET EVERY DAY 90 tablet 3   Cholecalciferol  (VITAMIN D-3) 25 MCG (1000 UT) CAPS Take 2,000 Units by mouth daily.     clopidogrel  (PLAVIX ) 75 MG tablet TAKE 1 TABLET EVERY DAY 90 tablet 2   colchicine  0.6 MG tablet Take 0.5 tablets (0.3 mg total) by mouth 2 (two) times daily. (Patient taking differently: Take 1 tablet by mouth daily.) 60 tablet 0   cyanocobalamin  (VITAMIN B12) 1000 MCG tablet Take 1,000 mcg by mouth daily.     ferrous sulfate  325 (65 FE) MG tablet Take 325 mg by mouth once a week.     gabapentin  (NEURONTIN ) 100 MG capsule TAKE 3 CAPSULES BY MOUTH 3 TIMES DAILY. (Patient taking differently: Take 200 mg by mouth daily.) 270 capsule 5   hydrochlorothiazide (HYDRODIURIL) 12.5 MG tablet Take 12.5 mg by mouth daily.     Omega-3 Fatty Acids (FISH OIL) 1000 MG CAPS Take 1,000 mg by mouth  daily.      pantoprazole  (PROTONIX ) 40 MG tablet Take 40 mg by mouth daily.     Psyllium (METAMUCIL) 0.36 g CAPS 1 capsule.     solifenacin (VESICARE) 5 MG tablet Take 5 mg by mouth daily.     apixaban  (ELIQUIS ) 5 MG TABS tablet Take 1 tablet (5 mg total) by mouth 2 (two) times daily. 180 tablet 1   diltiazem  (CARDIZEM  CD) 120 MG 24 hr capsule Take 1 capsule (120 mg total) by mouth daily. 90 capsule 1   vitamin E 45 MG (100 UNITS) capsule Take 100 Units by mouth daily.     No current facility-administered medications for this encounter.    Physical Exam: BP (!) 142/60   Pulse (!) 59   Ht 5' 6 (1.676 m)   Wt 60.7 kg   BMI 21.60 kg/m   GEN: Well nourished, well developed in no acute distress NECK: No JVD; No carotid bruits CARDIAC: Regular rate and  rhythm, no murmurs, rubs, gallops RESPIRATORY:  Clear to auscultation without rales, wheezing or rhonchi  ABDOMEN: Soft, non-tender, non-distended EXTREMITIES:  No edema; No deformity   Wt Readings from Last 3 Encounters:  03/19/24 60.7 kg  03/03/24 61.7 kg  02/18/24 61.7 kg    Lab Results  Component Value Date   TSH 1.210 03/04/2024   EKG today demonstrates:   EKG Interpretation Date/Time:  Thursday March 19 2024 10:17:08 EST Ventricular Rate:  59 PR Interval:  188 QRS Duration:  74 QT Interval:  644 QTC Calculation: 637 R Axis:   64  Text Interpretation: Long QTc Sinus bradycardia with Premature atrial complexes Nonspecific ST and T wave abnormality Abnormal ECG When compared with ECG of 11-Mar-2024 07:05, QT has lengthened Confirmed by Wyn Manus [phone]) on 03/19/2024 11:13:28 AM        Echo Completed 03/05/2024:  1. Left ventricular ejection fraction, by estimation, is 55 to 60%. The  left ventricle has normal function. The left ventricle has no regional  wall motion abnormalities. There is mild left ventricular hypertrophy.  Left ventricular diastolic function  could not be evaluated due to irregular rhythm.   2. Right ventricular systolic function is normal. The right ventricular  size is normal. There is mildly elevated pulmonary artery systolic  pressure. The estimated right ventricular systolic pressure is 37.1 mmHg.   3. Right atrial size was mildly dilated.   4. The mitral valve is normal in structure. Trivial mitral valve  regurgitation. No evidence of mitral stenosis.   5. Tricuspid valve regurgitation is moderate.   6. The aortic valve is tricuspid. Aortic valve regurgitation is not  visualized. Aortic valve sclerosis is present, with no evidence of aortic  valve stenosis.   CHA2DS2-VASc Score = 5  The patient's score is based upon: CHF History: 0 HTN History: 1 Diabetes History: 0 Stroke History: 0 Vascular Disease History: 1 Age Score:  2 Gender Score: 1      ASSESSMENT AND PLAN: Paroxysmal Atrial Flutter (ICD10:  I48.0) The patient's CHA2DS2-VASc score is 5, indicating a 7.2% annual risk of stroke.   - New onset diagnosed 03/03/2024 with inpatient TEE/DCCV with ERAF and Amio load with outpatient DCCV planned on 03/11/2024 but presented with sinus rhythm on date of procedure. - Patient compliant with amiodarone  with possible QT prolongation on EKG.  I have reached out to Dr. Inocencio for verification prior to discontinuing medication. She was advised to continue amiodarone  200 mg and to take with food to decrease-nausea. - Continue  Eliquis  5 mg and currently not eligible for dose reduction based on weight and renal function. - Consulted electrophysiologist to review EKG and assess amiodarone  use. - Consider diltiazem  if amiodarone  is discontinued - Will obtain surveillance labs at 3 months if continued amiodarone . - Follow-up in three months for lab work and EKG  CAD: -s/p LAD stent in 2006, DES to RCA x 2 in 2011 and 2 additional stents [facility] in [facility] - Today patient is doing well with no complaints of chest pain. - Continue current GDMT with Plavix , Lipitor  PAD: -s/p interventions in 2006, 2015 and 2016 with possible upcoming outpatient peripheral angiography to be completed based on status of atrial flutter  AAA: - Followed and monitored annually by Dr. Court with most recent CT of the abdomen completed 08/09/2023  HTN: -BP well controlled. Continue current antihypertensive regimen.   COPD: - Continue treatment plan per pulmonology  Tobacco abuse: - Smoking cessation encouraged.  Secondary Hypercoagulable State (ICD10:  D68.69) The patient is at significant risk for stroke/thromboembolism based upon her CHA2DS2-VASc Score of 5.  Continue Apixaban  (Eliquis ).   High Risk Medication Monitoring (ICD 10: [phone]) Patient requires ongoing monitoring for anti-arrhythmic  medication which has the potential to cause life threatening arrhythmias. - QT appears to be prolonged and EKG will be discussed with EP Dr. With plan to discontinue if verified. - Patient advised to continue amiodarone  200 mg  until otherwise instructed.  Addendum: -Dr. Inocencio replied and indicated that we should allow her to stay on the medication for another week and bring her back in for an EKG to check her QT interval.  If interval is long at that time we will discontinue amiodarone .  Signed,  Wyn Raddle, Jackee Shove, NP    03/19/2024 11:13 AM    Follow up with the AF Clinic in 3 months    "

## 2024-03-20 ENCOUNTER — Encounter (HOSPITAL_COMMUNITY): Payer: Self-pay | Admitting: *Deleted

## 2024-03-24 ENCOUNTER — Ambulatory Visit (HOSPITAL_COMMUNITY)

## 2024-03-24 ENCOUNTER — Ambulatory Visit: Admitting: Cardiovascular Disease

## 2024-03-25 ENCOUNTER — Ambulatory Visit (HOSPITAL_COMMUNITY)

## 2024-04-07 ENCOUNTER — Ambulatory Visit: Admitting: Cardiovascular Disease

## 2024-04-16 ENCOUNTER — Encounter (INDEPENDENT_AMBULATORY_CARE_PROVIDER_SITE_OTHER): Admitting: Ophthalmology

## 2024-06-18 ENCOUNTER — Ambulatory Visit (HOSPITAL_COMMUNITY): Admitting: Nurse Practitioner
